# Patient Record
Sex: Female | Born: 1975 | Race: Asian | Hispanic: No | Marital: Married | Smoking: Never smoker
Health system: Southern US, Community
[De-identification: ages and names within clinical notes are randomized; demographics above are authoritative.]

## PROBLEM LIST (undated history)

## (undated) DIAGNOSIS — F419 Anxiety disorder, unspecified: Secondary | ICD-10-CM

## (undated) DIAGNOSIS — J45909 Unspecified asthma, uncomplicated: Secondary | ICD-10-CM

## (undated) DIAGNOSIS — F909 Attention-deficit hyperactivity disorder, unspecified type: Secondary | ICD-10-CM

## (undated) HISTORY — DX: Attention-deficit hyperactivity disorder, unspecified type: F90.9

## (undated) HISTORY — PX: TOTAL VAGINAL HYSTERECTOMY: SHX2548

## (undated) HISTORY — DX: Anxiety disorder, unspecified: F41.9

## (undated) HISTORY — PX: RECTAL PROLAPSE REPAIR: SHX759

## (undated) HISTORY — DX: Unspecified asthma, uncomplicated: J45.909

---

## 2020-07-27 ENCOUNTER — Ambulatory Visit: Payer: Self-pay | Admitting: Family Medicine

## 2020-08-12 ENCOUNTER — Ambulatory Visit: Payer: Self-pay | Admitting: Family Medicine

## 2020-09-07 ENCOUNTER — Ambulatory Visit: Payer: Self-pay | Admitting: Family Medicine

## 2021-06-11 ENCOUNTER — Ambulatory Visit (HOSPITAL_COMMUNITY)
Admission: EM | Admit: 2021-06-11 | Discharge: 2021-06-11 | Disposition: A | Discharge status: Home / Self Care | Payer: BC Managed Care – PPO | Attending: Physician Assistant | Admitting: Physician Assistant

## 2021-06-11 ENCOUNTER — Encounter (HOSPITAL_COMMUNITY): Payer: Self-pay

## 2021-06-11 DIAGNOSIS — H5789 Other specified disorders of eye and adnexa: Secondary | ICD-10-CM | POA: Diagnosis not present

## 2021-06-11 MED ORDER — CIPROFLOXACIN HCL 0.3 % OP SOLN: 1.0000 [drp] | OPHTHALMIC | 0 refills | Status: DC

## 2021-06-11 NOTE — Discharge Instructions (Addendum)
Use ciprofloxacin eyedrops as directed.  This will cover for bacteria in case there is the beginning of an infection in your eye.  You can use lubricating eyedrops but should avoid anything that has vasoconstrictors (redness relief).  Do not wear contacts for at least a week and until symptoms have been gone for 24 hours.  If you have any worsening symptoms including recurrent blurred vision, vision changes, ocular pain, nausea, vomiting, fever you need to be seen immediately.  If you continue to have discomfort after being on medication for several days you should follow-up with your eye doctor.

## 2021-06-11 NOTE — ED Triage Notes (Signed)
Pt states yesterday while removing her contact lenses she thinks she might have done something to her left eye. She had burning to eye, vision was not clear. Today patient states vision is better but she is still having some burning to eye.

## 2021-06-11 NOTE — ED Provider Notes (Addendum)
[place]    CSN: [id number] Arrival date & time: 06/11/21  3614      History   Chief Complaint Chief Complaint  Patient presents with   Eye Pain    HPI Megan Roach is a 45 y.o. female.   Patient presents today with 1 day history of left eye irritation and vision disturbance.  Reports that she felt a pain after removing her contact lens from her left eye and noticed that her vision was quite blurred for several hours after this.  She did try over-the-counter lubricating eyedrops which provided some relief but not long-term resolution of symptoms.  Patient continued to have significant symptoms and took Advil and went to bed.  When she woke up this morning her vision had returned to normal but she continues to have discomfort in her left thigh.  She denies any ocular pain, headache, dizziness, nausea, vomiting, fever.  She denies any foreign body sensation.  She reports discomfort is rated 3 on a 0-10 pain scale, localized to her left eye, described as burning sensation, no alleviating factors identified.   History reviewed. No pertinent past medical history.  There are no problems to display for this patient.   History reviewed. No pertinent surgical history.  OB History   No obstetric history on file.      Home Medications    Prior to Admission medications   Medication Sig Start Date End Date Taking? Authorizing Provider  ciprofloxacin (CILOXAN) 0.3 % ophthalmic solution Place 1 drop into the left eye every 2 (two) hours. Administer 1 drop, every 2 hours, while awake, for 2 days. Then 1 drop, every 4 hours, while awake, for the next 5 days. 06/11/21  Yes Rendy Lazard, Derry Skill, PA-C    Family History History reviewed. No pertinent family history.  Social History Social History   Tobacco Use   Smoking status: Never   Smokeless tobacco: Never  Vaping Use   Vaping Use: Never used  Substance Use Topics   Alcohol use: Yes    Comment: socially   Drug use:  Never     Allergies   Sulfa antibiotics   Review of Systems Review of Systems  Constitutional:  Negative for activity change, appetite change, fatigue and fever.  Eyes:  Positive for discharge, itching and visual disturbance. Negative for photophobia, pain and redness.  Respiratory:  Negative for cough and shortness of breath.   Cardiovascular:  Negative for chest pain.  Gastrointestinal:  Negative for abdominal pain, diarrhea, nausea and vomiting.  Neurological:  Negative for dizziness, light-headedness and headaches.    Physical Exam Triage Vital Signs ED Triage Vitals  Enc Vitals Group     BP 06/11/21 0833 110/72     Pulse Rate 06/11/21 0833 80     Resp 06/11/21 0833 18     Temp 06/11/21 0833 98.9 F (37.2 C)     Temp Source 06/11/21 0833 Oral     SpO2 06/11/21 0833 98 %     Weight --      Height --      Head Circumference --      Peak Flow --      Pain Score 06/11/21 0830 3     Pain Loc --      Pain Edu? --      Excl. in [place]? --    No data found.  Updated Vital Signs BP 110/72 (BP Location: Right Arm)   Pulse 80   Temp 98.9 F (37.2 C) (  Oral)   Resp 18   SpO2 98%   Visual Acuity Right Eye Distance: 20/20 Left Eye Distance: 20/50 Bilateral Distance:    Right Eye Near:   Left Eye Near:    Bilateral Near:     Physical Exam Vitals reviewed.  Constitutional:      General: She is awake. She is not in acute distress.    Appearance: Normal appearance. She is normal weight. She is not ill-appearing.     Comments: Very pleasant female appears stated age in no acute distress  HENT:     Head: Normocephalic and atraumatic.     Right Ear: External ear normal.     Left Ear: External ear normal.  Eyes:     Extraocular Movements: Extraocular movements intact.     Conjunctiva/sclera:     Right eye: Right conjunctiva is not injected.     Left eye: Left conjunctiva is injected.     Pupils: Pupils are equal, round, and reactive to light.     Funduscopic exam:     Right eye: No hemorrhage or papilledema.        Left eye: No hemorrhage or papilledema.  Cardiovascular:     Rate and Rhythm: Normal rate and regular rhythm.     Heart sounds: Normal heart sounds, S1 normal and S2 normal. No murmur heard. Pulmonary:     Effort: Pulmonary effort is normal.     Breath sounds: Normal breath sounds. No wheezing, rhonchi or rales.     Comments: Clear to auscultation bilaterally Lymphadenopathy:     Head:     Right side of head: No submental, submandibular or tonsillar adenopathy.     Left side of head: No submental, submandibular or tonsillar adenopathy.     Cervical: No cervical adenopathy.  Psychiatric:        Behavior: Behavior is cooperative.     UC Treatments / Results  Labs (all labs ordered are listed, but only abnormal results are displayed) Labs Reviewed - No data to display  EKG   Radiology No results found.  Procedures Procedures (including critical care time)  Medications Ordered in UC Medications - No data to display  Initial Impression / Assessment and Plan / UC Course  I have reviewed the triage vital signs and the nursing notes.  Pertinent labs & imaging results that were available during my care of the patient were reviewed by me and considered in my medical decision making (see chart for details).      Unfortunately, we do not have fluorescein or Woods lamp to perform fluorescein exam.  Patient denies any foreign body sensation in eye and reports discomfort is more of a burning sensation making this exam less important.  Given patient is a contact lens wearer will cover with fluoroquinolone to ensure we are treating for bacterial conjunctivitis possibly caused by Pseudomonas.  Discussed proper application of this medication with patient.  She can use lubricating eyedrops for additional symptom relief.  Discouraged her from using contact lenses for minimum of a week and all symptoms must of resolved for at least 24 hours prior  to using lenses again.  Discussed alarm symptoms that warrant emergent evaluation.  Strict return precautions given to which patient expressed understanding.  Final Clinical Impressions(s) / UC Diagnoses   Final diagnoses:  Eye irritation     Discharge Instructions      Use ciprofloxacin eyedrops as directed.  This will cover for bacteria in case there is the beginning of an  infection in your eye.  You can use lubricating eyedrops but should avoid anything that has vasoconstrictors (redness relief).  Do not wear contacts for at least a week and until symptoms have been gone for 24 hours.  If you have any worsening symptoms including recurrent blurred vision, vision changes, ocular pain, nausea, vomiting, fever you need to be seen immediately.  If you continue to have discomfort after being on medication for several days you should follow-up with your eye doctor.     ED Prescriptions     Medication Sig Dispense Auth. Provider   ciprofloxacin (CILOXAN) 0.3 % ophthalmic solution Place 1 drop into the left eye every 2 (two) hours. Administer 1 drop, every 2 hours, while awake, for 2 days. Then 1 drop, every 4 hours, while awake, for the next 5 days. 5 mL Ifeanyichukwu Wickham K, PA-C      PDMP not reviewed this encounter.   Terrilee Croak, PA-C 06/11/21 0853    Terrilee Croak, PA-C 06/11/21 [id number]

## 2021-10-15 ENCOUNTER — Ambulatory Visit: Payer: BC Managed Care – PPO | Admitting: Registered Nurse

## 2021-10-15 ENCOUNTER — Encounter: Payer: Self-pay | Admitting: Registered Nurse

## 2021-10-15 DIAGNOSIS — G5603 Carpal tunnel syndrome, bilateral upper limbs: Secondary | ICD-10-CM

## 2021-10-15 DIAGNOSIS — Z1211 Encounter for screening for malignant neoplasm of colon: Secondary | ICD-10-CM

## 2021-10-15 DIAGNOSIS — Z23 Encounter for immunization: Secondary | ICD-10-CM

## 2021-10-15 DIAGNOSIS — Z1231 Encounter for screening mammogram for malignant neoplasm of breast: Secondary | ICD-10-CM

## 2021-10-15 DIAGNOSIS — Z780 Asymptomatic menopausal state: Secondary | ICD-10-CM

## 2021-10-15 MED ORDER — VENLAFAXINE HCL ER 37.5 MG PO CP24: 37.5000 mg | ORAL_CAPSULE | Freq: Every day | ORAL | 0 refills | Status: DC

## 2021-10-15 MED ORDER — DICLOFENAC SODIUM 3 % EX GEL: 4.0000 g | Freq: Four times a day (QID) | CUTANEOUS | 5 refills | Status: DC | PRN

## 2021-10-15 NOTE — Patient Instructions (Signed)
Ms. Megan Roach to meet you!  See you in 3 mo for physical and labs.  Try diclofenac topical for pain. I have referred to hand specialist. Wear splints at night.  Start venlafaxine 37.31m daily with breakfast for menopause symptoms If no improvement we can check labs. Call if any new symptoms arise.  Thank you  Rich

## 2021-10-15 NOTE — Progress Notes (Signed)
New Patient Office Visit  Subjective:  Patient ID: Megan Roach, female    DOB: 23-Apr-1976  Age: 45 y.o. MRN: [id number]  CC: No chief complaint on file.   HPI Megan Roach presents for visit to est care.  Menopause Fatigue, weight gain, hot flashes Poor sleep despite extreme fatigue.  Itchy across neck and face.  Intermittent symptoms.   Pain in wrists Bilateral, and in R elbow. Had been sent to Neuro for nerve conduction study, no notable results found. Had been recommended to see hand specialist, not referred yet.  Does note some swelling at base of thumbs Had been on gabapentin in the past without relief.  Occ advil for pain with limited relief.   Due for colon ca screen Opts for cologuard after discussion of pros/cons  Due for flu shot, would like today No AE in past.  Pap - uptodate, will get records  Mammogram - needed - will place order.    Past Medical History:  Diagnosis Date   Asthma     Past Surgical History:  Procedure Laterality Date   TOTAL VAGINAL HYSTERECTOMY      Family History  Problem Relation Age of Onset   Bladder Cancer Father    Idiopathic pulmonary fibrosis Father    Cervical cancer Other     Social History   Socioeconomic History   Marital status: Married    Spouse name: Not on file   Number of children: Not on file   Years of education: Not on file   Highest education level: Not on file  Occupational History   Not on file  Tobacco Use   Smoking status: Never   Smokeless tobacco: Never  Vaping Use   Vaping Use: Never used  Substance and Sexual Activity   Alcohol use: Yes    Comment: socially   Drug use: Never   Sexual activity: Not on file  Other Topics Concern   Not on file  Social History Narrative   Not on file   Social Determinants of Health   Financial Resource Strain: Not on file  Food Insecurity: Not on file  Transportation Needs: Not on file  Physical Activity: Not on file  Stress: Not on  file  Social Connections: Not on file  Intimate Partner Violence: Not on file    ROS Review of Systems Per hpi   Objective:   Today's Vitals: There were no vitals taken for this visit.  Physical Exam Vitals and nursing note reviewed.  Constitutional:      General: She is not in acute distress.    Appearance: Normal appearance. She is not ill-appearing, toxic-appearing or diaphoretic.  Cardiovascular:     Rate and Rhythm: Normal rate and regular rhythm.     Pulses: Normal pulses.     Heart sounds: Normal heart sounds. No murmur heard.   No friction rub. No gallop.  Pulmonary:     Effort: Pulmonary effort is normal. No respiratory distress.     Breath sounds: Normal breath sounds. No stridor. No wheezing, rhonchi or rales.  Chest:     Chest wall: No tenderness.  Musculoskeletal:        General: Tenderness (ventral wrists, R lateral epicondyle) present. No swelling, deformity or signs of injury. Normal range of motion.     Right lower leg: No edema.     Left lower leg: No edema.  Skin:    General: Skin is warm and dry.     Capillary Refill: Capillary refill takes  less than 2 seconds.  Neurological:     General: No focal deficit present.     Mental Status: She is alert and oriented to person, place, and time. Mental status is at baseline.  Psychiatric:        Mood and Affect: Mood normal.        Behavior: Behavior normal.        Thought Content: Thought content normal.        Judgment: Judgment normal.    Assessment & Plan:   Problem List Items Addressed This Visit   None Visit Diagnoses     Bilateral carpal tunnel syndrome    -  Primary   Relevant Medications   Diclofenac Sodium 3 % GEL   venlafaxine XR (EFFEXOR XR) 37.5 MG 24 hr capsule   Other Relevant Orders   Ambulatory referral to Hand Surgery   Screening mammogram for breast cancer       Relevant Orders   MM Digital Screening   Flu vaccine need       Relevant Orders   Flu Vaccine QUAD 6+ mos PF IM  (Fluarix Quad PF)   Menopause       Relevant Medications   venlafaxine XR (EFFEXOR XR) 37.5 MG 24 hr capsule   Colon cancer screening       Relevant Orders   Cologuard       Outpatient Encounter Medications as of 10/15/2021  Medication Sig   Diclofenac Sodium 3 % GEL Apply 4 g topically 4 (four) times daily as needed.   venlafaxine XR (EFFEXOR XR) 37.5 MG 24 hr capsule Take 1 capsule (37.5 mg total) by mouth daily with breakfast.   ciprofloxacin (CILOXAN) 0.3 % ophthalmic solution Place 1 drop into the left eye every 2 (two) hours. Administer 1 drop, every 2 hours, while awake, for 2 days. Then 1 drop, every 4 hours, while awake, for the next 5 days.   No facility-administered encounter medications on file as of 10/15/2021.    Follow-up: Return in about 3 months (around 01/13/2022) for CPE / labs.   PLAN Diclofenac for wrist pain. Refer to hand doc. Offered oral steroids, pt declined. Continue to wear braces at night, rest when possible. Venlafaxine 37.51m po qd for menopausal symptoms. Med check at CPE in 3 mo Return for CPE in 3 mo Mammogram ordered Cologuard ordered Flu vaccine given Patient encouraged to call clinic with any questions, comments, or concerns.  RMaximiano Coss NP

## 2021-11-06 ENCOUNTER — Encounter: Payer: Self-pay | Admitting: Registered Nurse

## 2021-11-06 LAB — COLOGUARD: COLOGUARD: NEGATIVE

## 2021-11-24 ENCOUNTER — Encounter: Payer: Self-pay | Admitting: Registered Nurse

## 2021-11-24 ENCOUNTER — Telehealth (INDEPENDENT_AMBULATORY_CARE_PROVIDER_SITE_OTHER): Payer: BC Managed Care – PPO | Admitting: Registered Nurse

## 2021-11-24 ENCOUNTER — Telehealth: Payer: Self-pay

## 2021-11-24 ENCOUNTER — Other Ambulatory Visit: Payer: Self-pay

## 2021-11-24 VITALS — BP 139/92

## 2021-11-24 DIAGNOSIS — R1114 Bilious vomiting: Secondary | ICD-10-CM | POA: Diagnosis not present

## 2021-11-24 DIAGNOSIS — R42 Dizziness and giddiness: Secondary | ICD-10-CM | POA: Diagnosis not present

## 2021-11-24 MED ORDER — MECLIZINE HCL 25 MG PO TABS: 25.0000 mg | ORAL_TABLET | Freq: Three times a day (TID) | ORAL | 0 refills | Status: DC | PRN

## 2021-11-24 MED ORDER — ONDANSETRON 4 MG PO TBDP: 4.0000 mg | ORAL_TABLET | Freq: Three times a day (TID) | ORAL | 0 refills | Status: DC | PRN

## 2021-11-24 NOTE — Patient Instructions (Signed)
° ° ° °  If you have lab work done today you will be contacted with your lab results within the next 2 weeks.  If you have not heard from us then please contact us. The fastest way to get your results is to register for My Chart. ° ° °IF you received an x-ray today, you will receive an invoice from [facility]. Please contact [facility] at [phone] with questions or concerns regarding your invoice.  ° °IF you received labwork today, you will receive an invoice from [facility]. Please contact [facility] at [phone] with questions or concerns regarding your invoice.  ° °Our billing staff will not be able to assist you with questions regarding bills from these companies. ° °You will be contacted with the lab results as soon as they are available. The fastest way to get your results is to activate your My Chart account. Instructions are located on the last page of this paperwork. If you have not heard from us regarding the results in 2 weeks, please contact this office. °  ° ° ° °

## 2021-11-24 NOTE — Progress Notes (Signed)
Telemedicine Encounter- SOAP NOTE Established Patient  This telephone encounter was conducted with the patient's (or proxy's) verbal consent via audio telecommunications: yes/no: Yes Patient was instructed to have this encounter in a suitably private space; and to only have persons present to whom they give permission to participate. In addition, patient identity was confirmed by use of name plus two identifiers (DOB and address).  I discussed the limitations, risks, security and privacy concerns of performing an evaluation and management service by telephone and the availability of in person appointments. I also discussed with the patient that there may be a patient responsible charge related to this service. The patient expressed understanding and agreed to proceed.  I spent a total of 14 minutes talking with the patient or their proxy.  Patient at home Provider in office  Participants: Kathrin Ruddy, NP and [name]  Chief Complaint  Patient presents with   Dizziness    Patient states she has been experiencing some nausea , dizziness, and fatigue for about one month. Since yesterday symptoms are getting worse.    Subjective   Megan Roach is a 46 y.o. established patient. Telephone visit today for dizziness  HPI Notes since yesterday, has intense nausea. Feels like pregnancy but is peri/post menopausal Having a tough time with food and smells Notes excess salivation. Vomiting x 1 time - "not full fledged" Feels dizzy, even at rest.   Notes that it makes her feel down that she is so down.  She pushed herself to exercise yesterday - afterwards she was so thoroughly exhausted that she could barely move.  Her sleep has been poor for some time, but worsening lately - last two nights hardly slept with waking every hour or so.    There are no problems to display for this patient.   Past Medical History:  Diagnosis Date   Asthma     Current Outpatient  Medications  Medication Sig Dispense Refill   ciprofloxacin (CILOXAN) 0.3 % ophthalmic solution Place 1 drop into the left eye every 2 (two) hours. Administer 1 drop, every 2 hours, while awake, for 2 days. Then 1 drop, every 4 hours, while awake, for the next 5 days. 5 mL 0   Diclofenac Sodium 3 % GEL Apply 4 g topically 4 (four) times daily as needed. 100 g 5   meclizine (ANTIVERT) 25 MG tablet Take 1 tablet (25 mg total) by mouth 3 (three) times daily as needed for dizziness. 30 tablet 0   ondansetron (ZOFRAN-ODT) 4 MG disintegrating tablet Take 1 tablet (4 mg total) by mouth every 8 (eight) hours as needed for nausea or vomiting. 20 tablet 0   venlafaxine XR (EFFEXOR XR) 37.5 MG 24 hr capsule Take 1 capsule (37.5 mg total) by mouth daily with breakfast. 90 capsule 0   No current facility-administered medications for this visit.    Allergies  Allergen Reactions   Sulfa Antibiotics Swelling    Social History   Socioeconomic History   Marital status: Married    Spouse name: Not on file   Number of children: Not on file   Years of education: Not on file   Highest education level: Not on file  Occupational History   Not on file  Tobacco Use   Smoking status: Never   Smokeless tobacco: Never  Vaping Use   Vaping Use: Never used  Substance and Sexual Activity   Alcohol use: Yes    Comment: socially   Drug use: Never  Sexual activity: Not on file  Other Topics Concern   Not on file  Social History Narrative   Not on file   Social Determinants of Health   Financial Resource Strain: Not on file  Food Insecurity: Not on file  Transportation Needs: Not on file  Physical Activity: Not on file  Stress: Not on file  Social Connections: Not on file  Intimate Partner Violence: Not on file    ROS  Objective   Vitals as reported by the patient: Today's Vitals   11/24/21 1155  BP: (!) 139/92    Megan Roach was seen today for dizziness.  Diagnoses and all orders for this  visit:  Dizziness -     meclizine (ANTIVERT) 25 MG tablet; Take 1 tablet (25 mg total) by mouth 3 (three) times daily as needed for dizziness.  Bilious vomiting with nausea -     ondansetron (ZOFRAN-ODT) 4 MG disintegrating tablet; Take 1 tablet (4 mg total) by mouth every 8 (eight) hours as needed for nausea or vomiting.   PLAN Unclear etiology. Bppv vs. Viral gastric illness. Will give zofran and meclizine Return for in office visit if worsening or failing to improve Patient encouraged to call clinic with any questions, comments, or concerns.   I discussed the assessment and treatment plan with the patient. The patient was provided an opportunity to ask questions and all were answered. The patient agreed with the plan and demonstrated an understanding of the instructions.   The patient was advised to call back or seek an in-person evaluation if the symptoms worsen or if the condition fails to improve as anticipated.  I provided 14 minutes of non-face-to-face time during this encounter.  Maximiano Coss, NP

## 2021-11-25 NOTE — Telephone Encounter (Signed)
Error

## 2021-12-01 ENCOUNTER — Telehealth: Payer: Self-pay | Admitting: Registered Nurse

## 2021-12-01 NOTE — Telephone Encounter (Signed)
Pt called in stating that she still isn't feeling any better she is still having the nausea and dizziness please advise on if she should come in to be seen again

## 2021-12-02 NOTE — Telephone Encounter (Signed)
Should be seen in person if feeling has persisted.  Thanks,  Denice Paradise

## 2021-12-03 ENCOUNTER — Other Ambulatory Visit: Payer: Self-pay

## 2021-12-03 ENCOUNTER — Ambulatory Visit: Payer: BC Managed Care – PPO | Admitting: Registered Nurse

## 2021-12-03 ENCOUNTER — Encounter: Payer: Self-pay | Admitting: Registered Nurse

## 2021-12-03 VITALS — BP 112/68 | HR 77 | Temp 98.0°F | Resp 18 | Ht 59.0 in | Wt 152.4 lb

## 2021-12-03 DIAGNOSIS — J339 Nasal polyp, unspecified: Secondary | ICD-10-CM

## 2021-12-03 DIAGNOSIS — Z8639 Personal history of other endocrine, nutritional and metabolic disease: Secondary | ICD-10-CM | POA: Diagnosis not present

## 2021-12-03 DIAGNOSIS — R110 Nausea: Secondary | ICD-10-CM

## 2021-12-03 DIAGNOSIS — R5383 Other fatigue: Secondary | ICD-10-CM

## 2021-12-03 LAB — URINALYSIS, ROUTINE W REFLEX MICROSCOPIC
Bilirubin Urine: NEGATIVE
Ketones, ur: NEGATIVE
Leukocytes,Ua: NEGATIVE
Nitrite: NEGATIVE
Specific Gravity, Urine: 1.02 (ref 1.000–1.030)
Total Protein, Urine: NEGATIVE
Urine Glucose: NEGATIVE
Urobilinogen, UA: 0.2 (ref 0.0–1.0)
pH: 5.5 (ref 5.0–8.0)

## 2021-12-03 LAB — CBC WITH DIFFERENTIAL/PLATELET
Basophils Absolute: 0 10*3/uL (ref 0.0–0.1)
Basophils Relative: 0.4 % (ref 0.0–3.0)
Eosinophils Absolute: 0.1 10*3/uL (ref 0.0–0.7)
Eosinophils Relative: 1.5 % (ref 0.0–5.0)
HCT: 38.3 % (ref 36.0–46.0)
Hemoglobin: 13.1 g/dL (ref 12.0–15.0)
Lymphocytes Relative: 30.1 % (ref 12.0–46.0)
Lymphs Abs: 1.7 10*3/uL (ref 0.7–4.0)
MCHC: 34.1 g/dL (ref 30.0–36.0)
MCV: 86.6 fl (ref 78.0–100.0)
Monocytes Absolute: 0.4 10*3/uL (ref 0.1–1.0)
Monocytes Relative: 6.7 % (ref 3.0–12.0)
Neutro Abs: 3.5 10*3/uL (ref 1.4–7.7)
Neutrophils Relative %: 61.3 % (ref 43.0–77.0)
Platelets: 283 10*3/uL (ref 150.0–400.0)
RBC: 4.43 Mil/uL (ref 3.87–5.11)
RDW: 14.2 % (ref 11.5–15.5)
WBC: 5.7 10*3/uL (ref 4.0–10.5)

## 2021-12-03 LAB — COMPREHENSIVE METABOLIC PANEL
ALT: 30 U/L (ref 0–35)
AST: 20 U/L (ref 0–37)
Albumin: 4.3 g/dL (ref 3.5–5.2)
Alkaline Phosphatase: 51 U/L (ref 39–117)
BUN: 18 mg/dL (ref 6–23)
CO2: 29 mEq/L (ref 19–32)
Calcium: 9.2 mg/dL (ref 8.4–10.5)
Chloride: 102 mEq/L (ref 96–112)
Creatinine, Ser: 0.81 mg/dL (ref 0.40–1.20)
GFR: 86.4 mL/min (ref >60.00)
Glucose, Bld: 83 mg/dL (ref 70–99)
Potassium: 3.9 mEq/L (ref 3.5–5.1)
Sodium: 136 mEq/L (ref 135–145)
Total Bilirubin: 0.6 mg/dL (ref 0.2–1.2)
Total Protein: 6.8 g/dL (ref 6.0–8.3)

## 2021-12-03 LAB — B12 AND FOLATE PANEL
Folate: 15 ng/mL (ref >5.9)
Vitamin B-12: 435 pg/mL (ref 211–911)

## 2021-12-03 LAB — LIPID PANEL
Cholesterol: 203 mg/dL — ABNORMAL HIGH (ref 0–200)
HDL: 67.8 mg/dL (ref >39.00)
LDL Cholesterol: 119 mg/dL — ABNORMAL HIGH (ref 0–99)
NonHDL: 134.89
Total CHOL/HDL Ratio: 3
Triglycerides: 80 mg/dL (ref 0.0–149.0)
VLDL: 16 mg/dL (ref 0.0–40.0)

## 2021-12-03 LAB — TSH: TSH: 1.98 u[IU]/mL (ref 0.35–5.50)

## 2021-12-03 LAB — HEMOGLOBIN A1C: Hgb A1c MFr Bld: 5.4 % (ref 4.6–6.5)

## 2021-12-03 LAB — VITAMIN D 25 HYDROXY (VIT D DEFICIENCY, FRACTURES): VITD: 23.49 ng/mL — ABNORMAL LOW (ref 30.00–100.00)

## 2021-12-03 MED ORDER — PANTOPRAZOLE SODIUM 40 MG PO TBEC: 40.0000 mg | DELAYED_RELEASE_TABLET | Freq: Every day | ORAL | 3 refills | Status: DC

## 2021-12-03 MED ORDER — FLUTICASONE PROPIONATE 50 MCG/ACT NA SUSP: 2.0000 | Freq: Every day | NASAL | 6 refills | Status: DC

## 2021-12-03 NOTE — Patient Instructions (Signed)
Ms. Aleighya Mcanelly to see you, sorry you're not feeling well.  Call if things get worse  I'll let you know how labs are looking  Thank you  Rich

## 2021-12-03 NOTE — Telephone Encounter (Signed)
Pt is scheduled for today

## 2021-12-03 NOTE — Progress Notes (Signed)
Established Patient Office Visit  Subjective:  Patient ID: Megan Roach, female    DOB: 12-16-1975  Age: 46 y.o. MRN: [id number]  CC:  Chief Complaint  Patient presents with   Follow-up    Patient states she is follow up for 11/24/2021 for dizziness and nausea. Patient states when she take the medication she is feeling better but when she stop she is back feeling bad again     HPI Megan Roach presents for follow up  Seen virtually on 11/24/21 for nvd, fatigue  Onset on 11/23/21. Trouble with smells, foods. Feels like pregnancy, but she is perimenopausal with irregular menses, denied concern for pregnancy.  Dizziness at rest. One episode vomiting.  Excess salivation like she has been preparing to vomit  We had given her meclizien and zofran, when taking both, she does feel better with both of these, but symptoms still occur Symptoms occur episodically - every few hours - no clear pattern Has not been vomiting. No diarrhea.  Notes her heart races in response to these episodes   S/p hysterectomy   Has continued to be mindful of healthy diet and exercise.   Past Medical History:  Diagnosis Date   Asthma     Past Surgical History:  Procedure Laterality Date   TOTAL VAGINAL HYSTERECTOMY      Family History  Problem Relation Age of Onset   Bladder Cancer Father    Idiopathic pulmonary fibrosis Father    Cervical cancer Other     Social History   Socioeconomic History   Marital status: Married    Spouse name: Not on file   Number of children: Not on file   Years of education: Not on file   Highest education level: Not on file  Occupational History   Not on file  Tobacco Use   Smoking status: Never   Smokeless tobacco: Never  Vaping Use   Vaping Use: Never used  Substance and Sexual Activity   Alcohol use: Yes    Comment: socially   Drug use: Never   Sexual activity: Not on file  Other Topics Concern   Not on file  Social History Narrative   Not  on file   Social Determinants of Health   Financial Resource Strain: Not on file  Food Insecurity: Not on file  Transportation Needs: Not on file  Physical Activity: Not on file  Stress: Not on file  Social Connections: Not on file  Intimate Partner Violence: Not on file    Outpatient Medications Prior to Visit  Medication Sig Dispense Refill   ciprofloxacin (CILOXAN) 0.3 % ophthalmic solution Place 1 drop into the left eye every 2 (two) hours. Administer 1 drop, every 2 hours, while awake, for 2 days. Then 1 drop, every 4 hours, while awake, for the next 5 days. 5 mL 0   Diclofenac Sodium 3 % GEL Apply 4 g topically 4 (four) times daily as needed. 100 g 5   meclizine (ANTIVERT) 25 MG tablet Take 1 tablet (25 mg total) by mouth 3 (three) times daily as needed for dizziness. 30 tablet 0   ondansetron (ZOFRAN-ODT) 4 MG disintegrating tablet Take 1 tablet (4 mg total) by mouth every 8 (eight) hours as needed for nausea or vomiting. 20 tablet 0   venlafaxine XR (EFFEXOR XR) 37.5 MG 24 hr capsule Take 1 capsule (37.5 mg total) by mouth daily with breakfast. 90 capsule 0   No facility-administered medications prior to visit.    Allergies  Allergen  Reactions   Sulfa Antibiotics Swelling    ROS Review of Systems  Constitutional: Negative.   HENT: Negative.    Eyes: Negative.   Respiratory: Negative.    Cardiovascular: Negative.   Gastrointestinal: Negative.   Genitourinary: Negative.   Musculoskeletal: Negative.   Skin: Negative.   Neurological: Negative.   Psychiatric/Behavioral: Negative.    All other systems reviewed and are negative.    Objective:    Physical Exam Vitals and nursing note reviewed.  Constitutional:      General: She is not in acute distress.    Appearance: Normal appearance. She is normal weight. She is not ill-appearing, toxic-appearing or diaphoretic.  Cardiovascular:     Rate and Rhythm: Normal rate and regular rhythm.     Heart sounds: Normal  heart sounds. No murmur heard.   No friction rub. No gallop.  Pulmonary:     Effort: Pulmonary effort is normal. No respiratory distress.     Breath sounds: Normal breath sounds. No stridor. No wheezing, rhonchi or rales.  Chest:     Chest wall: No tenderness.  Skin:    General: Skin is warm and dry.  Neurological:     General: No focal deficit present.     Mental Status: She is alert and oriented to person, place, and time. Mental status is at baseline.  Psychiatric:        Mood and Affect: Mood normal.        Behavior: Behavior normal.        Thought Content: Thought content normal.        Judgment: Judgment normal.    BP 112/68    Pulse 77    Temp 98 F (36.7 C) (Temporal)    Resp 18    Ht 4' 11"  (1.499 m)    Wt 152 lb 6.4 oz (69.1 kg)    SpO2 97%    BMI 30.78 kg/m  Wt Readings from Last 3 Encounters:  12/03/21 152 lb 6.4 oz (69.1 kg)     Health Maintenance Due  Topic Date Due   HIV Screening  Never done   Hepatitis C Screening  Never done   TETANUS/TDAP  Never done   PAP SMEAR-Modifier  Never done   COLONOSCOPY (Pts 45-37yr Insurance coverage will need to be confirmed)  Never done   COVID-19 Vaccine (4 - Booster for Moderna series) 11/21/2020    There are no preventive care reminders to display for this patient.  No results found for: TSH No results found for: WBC, HGB, HCT, MCV, PLT No results found for: NA, K, CHLORIDE, CO2, GLUCOSE, BUN, CREATININE, BILITOT, ALKPHOS, AST, ALT, PROT, ALBUMIN, CALCIUM, ANIONGAP, EGFR, GFR No results found for: CHOL No results found for: HDL No results found for: LDLCALC No results found for: TRIG No results found for: CHOLHDL No results found for: HGBA1C    Assessment & Plan:   Problem List Items Addressed This Visit   None Visit Diagnoses     Other fatigue    -  Primary   Relevant Orders   CBC with Differential/Platelet   Comprehensive metabolic panel   Lipid panel   TSH   Hemoglobin A1c   B12 and Folate Panel    Vitamin D (25 hydroxy)   Urinalysis, Routine w reflex microscopic   Nausea without vomiting       Relevant Medications   pantoprazole (PROTONIX) 40 MG tablet   Other Relevant Orders   CBC with Differential/Platelet   Comprehensive metabolic panel  Lipid panel   TSH   Hemoglobin A1c   B12 and Folate Panel   Vitamin D (25 hydroxy)   Urinalysis, Routine w reflex microscopic   History of thyroid disease       Relevant Orders   CBC with Differential/Platelet   Comprehensive metabolic panel   Lipid panel   TSH   Hemoglobin A1c   B12 and Folate Panel   Vitamin D (25 hydroxy)   Urinalysis, Routine w reflex microscopic   Nasal polyp       Relevant Medications   fluticasone (FLONASE) 50 MCG/ACT nasal spray       Meds ordered this encounter  Medications   pantoprazole (PROTONIX) 40 MG tablet    Sig: Take 1 tablet (40 mg total) by mouth daily.    Dispense:  90 tablet    Refill:  3    Order Specific Question:   Supervising Provider    Answer:   Carlota Raspberry, JEFFREY R [[id number]]   fluticasone (FLONASE) 50 MCG/ACT nasal spray    Sig: Place 2 sprays into both nostrils daily.    Dispense:  16 g    Refill:  6    Order Specific Question:   Supervising Provider    Answer:   Carlota Raspberry, JEFFREY R [[id number]]    Follow-up: Return if symptoms worsen or fail to improve.   PLAN Unclear etiology. Possible gerd, will give pantoprazole. Hx of hypothyroidism, may contribute to slow gastric emptying? Will check labs. Flonase for nasal polyp Patient encouraged to call clinic with any questions, comments, or concerns.  Maximiano Coss, NP

## 2021-12-24 ENCOUNTER — Encounter: Payer: Self-pay | Admitting: Registered Nurse

## 2022-01-06 ENCOUNTER — Ambulatory Visit (INDEPENDENT_AMBULATORY_CARE_PROVIDER_SITE_OTHER): Payer: BC Managed Care – PPO | Admitting: Registered Nurse

## 2022-01-06 ENCOUNTER — Encounter: Payer: Self-pay | Admitting: Registered Nurse

## 2022-01-06 ENCOUNTER — Other Ambulatory Visit: Payer: Self-pay

## 2022-01-06 VITALS — BP 126/84 | HR 78 | Temp 98.1°F | Resp 16 | Ht <= 58 in | Wt 153.4 lb

## 2022-01-06 DIAGNOSIS — Z1231 Encounter for screening mammogram for malignant neoplasm of breast: Secondary | ICD-10-CM

## 2022-01-06 DIAGNOSIS — Z780 Asymptomatic menopausal state: Secondary | ICD-10-CM

## 2022-01-06 MED ORDER — VENLAFAXINE HCL ER 75 MG PO CP24: 75.0000 mg | ORAL_CAPSULE | Freq: Every day | ORAL | 1 refills | Status: DC

## 2022-01-06 NOTE — Progress Notes (Signed)
? ?Complete physical exam ? ?Patient: Megan Roach   DOB: 12-07-1975   46 y.o. Female  MRN: [id number] ?Visit Date: 01/06/2022 ? ?Subjective:  ? No chief complaint on file. ? ? ?Megan Roach is a 46 y.o. female who presents today for a complete physical exam. She reports consuming a general diet. The patient does not participate in regular exercise at present. She generally feels well. She reports sleeping well. She does have additional problems to discuss today.  ? ?Menopause ?Having a tough time ?Fatigue, low energy, poor sleep.  ?On venlafaxine - mild effect ?Interested in adjusting dosing. ? ? ?Vision:Within the last year ?Dental:Within Last 6 months ?STD Screen:No ? ?Most recent fall risk assessment: ? ?  01/06/2022  ?  8:15 AM  ?Fall Risk   ?Falls in the past year? 0  ?Risk for fall due to : No Fall Risks  ?Follow up Falls evaluation completed  ? ?  ?Most recent depression screenings: ? ?  01/06/2022  ?  8:14 AM 11/24/2021  ? 11:57 AM  ?PHQ 2/9 Scores  ?PHQ - 2 Score 2 0  ?PHQ- 9 Score 12 0  ? ? ? ?Past Medical History:  ?Diagnosis Date  ? Asthma   ? ?Allergies  ?Allergen Reactions  ? Sulfa Antibiotics Swelling  ? ?  ?Patient Care Team: ?Maximiano Coss, NP as PCP - General (Adult Health Nurse Practitioner)  ? ?Medications: ?Outpatient Medications Prior to Visit  ?Medication Sig  ? fluticasone (FLONASE) 50 MCG/ACT nasal spray Place 2 sprays into both nostrils daily.  ? meclizine (ANTIVERT) 25 MG tablet Take 1 tablet (25 mg total) by mouth 3 (three) times daily as needed for dizziness.  ? ondansetron (ZOFRAN-ODT) 4 MG disintegrating tablet Take 1 tablet (4 mg total) by mouth every 8 (eight) hours as needed for nausea or vomiting.  ? pantoprazole (PROTONIX) 40 MG tablet Take 1 tablet (40 mg total) by mouth daily.  ? [DISCONTINUED] venlafaxine XR (EFFEXOR XR) 37.5 MG 24 hr capsule Take 1 capsule (37.5 mg total) by mouth daily with breakfast.  ? [DISCONTINUED] ciprofloxacin (CILOXAN) 0.3 % ophthalmic solution  Place 1 drop into the left eye every 2 (two) hours. Administer 1 drop, every 2 hours, while awake, for 2 days. Then 1 drop, every 4 hours, while awake, for the next 5 days. (Patient not taking: Reported on 01/06/2022)  ? [DISCONTINUED] Diclofenac Sodium 3 % GEL Apply 4 g topically 4 (four) times daily as needed. (Patient not taking: Reported on 01/06/2022)  ? ?No facility-administered medications prior to visit.  ? ? ?Review of Systems  ?Constitutional: Negative.   ?HENT: Negative.    ?Eyes: Negative.   ?Respiratory: Negative.    ?Cardiovascular: Negative.   ?Gastrointestinal: Negative.   ?Genitourinary: Negative.   ?Musculoskeletal: Negative.   ?Skin: Negative.   ?Neurological: Negative.   ?Psychiatric/Behavioral: Negative.    ?All other systems reviewed and are negative. ? ? ?   ?Objective:  ? ?  ?BP 126/84   Pulse 78   Temp 98.1 ?F (36.7 ?C)   Resp 16   Ht 4' 10"  (1.473 m)   Wt 153 lb 6.4 oz (69.6 kg)   SpO2 98%   BMI 32.06 kg/m?  ? ? ? ?Physical Exam ?Vitals and nursing note reviewed.  ?Constitutional:   ?   General: She is not in acute distress. ?   Appearance: Normal appearance. She is normal weight. She is not ill-appearing, toxic-appearing or diaphoretic.  ?HENT:  ?   Head: Normocephalic and  atraumatic.  ?   Right Ear: Tympanic membrane, ear canal and external ear normal. There is no impacted cerumen.  ?   Left Ear: Tympanic membrane, ear canal and external ear normal. There is no impacted cerumen.  ?   Nose: Nose normal. No congestion or rhinorrhea.  ?   Mouth/Throat:  ?   Mouth: Mucous membranes are moist.  ?   Pharynx: Oropharynx is clear. No oropharyngeal exudate or posterior oropharyngeal erythema.  ?Eyes:  ?   General: No scleral icterus.    ?   Right eye: No discharge.     ?   Left eye: No discharge.  ?   Extraocular Movements: Extraocular movements intact.  ?   Conjunctiva/sclera: Conjunctivae normal.  ?   Pupils: Pupils are equal, round, and reactive to light.  ?Neck:  ?   Vascular: No carotid  bruit.  ?Cardiovascular:  ?   Rate and Rhythm: Normal rate and regular rhythm.  ?   Pulses: Normal pulses.  ?   Heart sounds: Normal heart sounds. No murmur heard. ?  No friction rub. No gallop.  ?Pulmonary:  ?   Effort: Pulmonary effort is normal. No respiratory distress.  ?   Breath sounds: Normal breath sounds. No stridor. No wheezing, rhonchi or rales.  ?Chest:  ?   Chest wall: No tenderness.  ?Abdominal:  ?   General: Abdomen is flat. Bowel sounds are normal. There is no distension.  ?   Palpations: There is no mass.  ?   Tenderness: There is no abdominal tenderness. There is no right CVA tenderness, left CVA tenderness, guarding or rebound.  ?   Hernia: No hernia is present.  ?Musculoskeletal:     ?   General: No swelling, tenderness, deformity or signs of injury. Normal range of motion.  ?   Cervical back: Normal range of motion and neck supple. No rigidity or tenderness.  ?   Right lower leg: No edema.  ?   Left lower leg: No edema.  ?Lymphadenopathy:  ?   Cervical: No cervical adenopathy.  ?Skin: ?   General: Skin is warm and dry.  ?   Capillary Refill: Capillary refill takes less than 2 seconds.  ?   Coloration: Skin is not jaundiced or pale.  ?   Findings: No bruising, erythema, lesion or rash.  ?Neurological:  ?   General: No focal deficit present.  ?   Mental Status: She is alert and oriented to person, place, and time. Mental status is at baseline.  ?   Cranial Nerves: No cranial nerve deficit.  ?   Sensory: No sensory deficit.  ?   Motor: No weakness.  ?   Coordination: Coordination normal.  ?   Gait: Gait normal.  ?   Deep Tendon Reflexes: Reflexes normal.  ?Psychiatric:     ?   Mood and Affect: Mood normal.     ?   Behavior: Behavior normal.     ?   Thought Content: Thought content normal.     ?   Judgment: Judgment normal.  ?  ? ?No results found for any visits on 01/06/22. ?   ?Assessment & Plan:  ?  ?Routine Health Maintenance and Physical Exam ? ?Immunization History  ?Administered Date(s)  Administered  ? Influenza,inj,Quad PF,6+ Mos 10/15/2021  ? Moderna Sars-Covid-2 Vaccination 01/10/2020, 02/07/2020  ? PFIZER(Purple Top)SARS-COV-2 Vaccination 09/26/2020  ? ? ?Health Maintenance  ?Topic Date Due  ? TETANUS/TDAP  Never done  ? PAP SMEAR-Modifier  Never done  ? COVID-19 Vaccine (4 - Booster for Moderna series) 11/21/2020  ? Hepatitis C Screening  01/07/2023 (Originally 06/03/1992)  ? HIV Screening  01/07/2023 (Originally 06/03/1989)  ? COLONOSCOPY (Pts 45-48yr Insurance coverage will need to be confirmed)  11/02/2031  ? INFLUENZA VACCINE  Completed  ? HPV VACCINES  Aged Out  ? ? ?Discussed health benefits of physical activity, and encouraged her to engage in regular exercise appropriate for her age and condition. ? ?Problem List Items Addressed This Visit   ?None ?Visit Diagnoses   ? ? Screening mammogram for breast cancer    -  Primary  ? Relevant Orders  ? MM Digital Screening  ? Menopause      ? Relevant Medications  ? venlafaxine XR (EFFEXOR-XR) 75 MG 24 hr capsule  ? Other Relevant Orders  ? Ambulatory referral to Gynecology  ? ?  ? ?Return in about 1 year (around 01/07/2023) for CPE and labs. ? ?  ?PLAN ?Increase venlafaxine to 752mpo qd ?Refer to gyn ?Mm ordered ?Return annually ?Exam unremarkable ?Reviewed last labs ?Patient encouraged to call clinic with any questions, comments, or concerns. ? ?RiMaximiano CossNP ?

## 2022-01-06 NOTE — Patient Instructions (Signed)
Ms. Lichty -  ? ?Always a pleasure to see you ?Let me know how things go in coming weeks ? ?Referrals placed, med adjusted ? ?Thank you ? ?Rich  ?

## 2022-01-29 ENCOUNTER — Observation Stay (HOSPITAL_COMMUNITY)
Admission: EM | Admit: 2022-01-29 | Discharge: 2022-01-31 | Disposition: A | Discharge status: Home / Self Care | Payer: BC Managed Care – PPO | Attending: Internal Medicine | Admitting: Internal Medicine

## 2022-01-29 ENCOUNTER — Emergency Department (HOSPITAL_COMMUNITY): Payer: BC Managed Care – PPO

## 2022-01-29 ENCOUNTER — Observation Stay (HOSPITAL_COMMUNITY): Payer: BC Managed Care – PPO

## 2022-01-29 DIAGNOSIS — R531 Weakness: Secondary | ICD-10-CM | POA: Diagnosis not present

## 2022-01-29 DIAGNOSIS — F4320 Adjustment disorder, unspecified: Principal | ICD-10-CM | POA: Insufficient documentation

## 2022-01-29 DIAGNOSIS — J45909 Unspecified asthma, uncomplicated: Secondary | ICD-10-CM | POA: Insufficient documentation

## 2022-01-29 DIAGNOSIS — R3129 Other microscopic hematuria: Secondary | ICD-10-CM | POA: Insufficient documentation

## 2022-01-29 DIAGNOSIS — R4182 Altered mental status, unspecified: Secondary | ICD-10-CM | POA: Diagnosis present

## 2022-01-29 DIAGNOSIS — R7309 Other abnormal glucose: Secondary | ICD-10-CM | POA: Insufficient documentation

## 2022-01-29 DIAGNOSIS — R2990 Unspecified symptoms and signs involving the nervous system: Secondary | ICD-10-CM | POA: Diagnosis present

## 2022-01-29 DIAGNOSIS — F4321 Adjustment disorder with depressed mood: Secondary | ICD-10-CM | POA: Diagnosis present

## 2022-01-29 DIAGNOSIS — Z20822 Contact with and (suspected) exposure to covid-19: Secondary | ICD-10-CM | POA: Diagnosis not present

## 2022-01-29 DIAGNOSIS — G4453 Primary thunderclap headache: Secondary | ICD-10-CM | POA: Diagnosis present

## 2022-01-29 DIAGNOSIS — N951 Menopausal and female climacteric states: Secondary | ICD-10-CM | POA: Insufficient documentation

## 2022-01-29 DIAGNOSIS — Z634 Disappearance and death of family member: Secondary | ICD-10-CM | POA: Diagnosis present

## 2022-01-29 DIAGNOSIS — G43509 Persistent migraine aura without cerebral infarction, not intractable, without status migrainosus: Secondary | ICD-10-CM

## 2022-01-29 LAB — ETHANOL: Alcohol, Ethyl (B): <10 mg/dL (ref <10)

## 2022-01-29 LAB — RESP PANEL BY RT-PCR (FLU A&B, COVID) ARPGX2
Influenza A by PCR: NEGATIVE
Influenza B by PCR: NEGATIVE
SARS Coronavirus 2 by RT PCR: NEGATIVE

## 2022-01-29 LAB — CBC
HCT: 39.6 % (ref 36.0–46.0)
Hemoglobin: 13.3 g/dL (ref 12.0–15.0)
MCH: 30 pg (ref 26.0–34.0)
MCHC: 33.6 g/dL (ref 30.0–36.0)
MCV: 89.2 fL (ref 80.0–100.0)
Platelets: 307 10*3/uL (ref 150–400)
RBC: 4.44 MIL/uL (ref 3.87–5.11)
RDW: 13.4 % (ref 11.5–15.5)
WBC: 6.2 10*3/uL (ref 4.0–10.5)
nRBC: 0 % (ref 0.0–0.2)

## 2022-01-29 LAB — I-STAT CHEM 8, ED
BUN: 14 mg/dL (ref 6–20)
Calcium, Ion: 1.21 mmol/L (ref 1.15–1.40)
Chloride: 104 mmol/L (ref 98–111)
Creatinine, Ser: 0.8 mg/dL (ref 0.44–1.00)
Glucose, Bld: 104 mg/dL — ABNORMAL HIGH (ref 70–99)
HCT: 38 % (ref 36.0–46.0)
Hemoglobin: 12.9 g/dL (ref 12.0–15.0)
Potassium: 4.2 mmol/L (ref 3.5–5.1)
Sodium: 141 mmol/L (ref 135–145)
TCO2: 26 mmol/L (ref 22–32)

## 2022-01-29 LAB — URINALYSIS, ROUTINE W REFLEX MICROSCOPIC
Bilirubin Urine: NEGATIVE
Glucose, UA: NEGATIVE mg/dL
Ketones, ur: NEGATIVE mg/dL
Leukocytes,Ua: NEGATIVE
Nitrite: NEGATIVE
Protein, ur: NEGATIVE mg/dL
Specific Gravity, Urine: 1.01 (ref 1.005–1.030)
pH: 6 (ref 5.0–8.0)

## 2022-01-29 LAB — COMPREHENSIVE METABOLIC PANEL
ALT: 19 U/L (ref 0–44)
AST: 20 U/L (ref 15–41)
Albumin: 3.6 g/dL (ref 3.5–5.0)
Alkaline Phosphatase: 51 U/L (ref 38–126)
Anion gap: 6 (ref 5–15)
BUN: 13 mg/dL (ref 6–20)
CO2: 25 mmol/L (ref 22–32)
Calcium: 9.1 mg/dL (ref 8.9–10.3)
Chloride: 108 mmol/L (ref 98–111)
Creatinine, Ser: 0.85 mg/dL (ref 0.44–1.00)
GFR, Estimated: >60 mL/min (ref >60)
Glucose, Bld: 107 mg/dL — ABNORMAL HIGH (ref 70–99)
Potassium: 4.1 mmol/L (ref 3.5–5.1)
Sodium: 139 mmol/L (ref 135–145)
Total Bilirubin: 0.6 mg/dL (ref 0.3–1.2)
Total Protein: 6.8 g/dL (ref 6.5–8.1)

## 2022-01-29 LAB — DIFFERENTIAL
Abs Immature Granulocytes: 0.03 10*3/uL (ref 0.00–0.07)
Basophils Absolute: 0 10*3/uL (ref 0.0–0.1)
Basophils Relative: 1 %
Eosinophils Absolute: 0.1 10*3/uL (ref 0.0–0.5)
Eosinophils Relative: 2 %
Immature Granulocytes: 1 %
Lymphocytes Relative: 27 %
Lymphs Abs: 1.7 10*3/uL (ref 0.7–4.0)
Monocytes Absolute: 0.4 10*3/uL (ref 0.1–1.0)
Monocytes Relative: 7 %
Neutro Abs: 3.9 10*3/uL (ref 1.7–7.7)
Neutrophils Relative %: 62 %

## 2022-01-29 LAB — CBG MONITORING, ED: Glucose-Capillary: 115 mg/dL — ABNORMAL HIGH (ref 70–99)

## 2022-01-29 LAB — RAPID URINE DRUG SCREEN, HOSP PERFORMED
Amphetamines: NOT DETECTED
Barbiturates: NOT DETECTED
Benzodiazepines: NOT DETECTED
Cocaine: NOT DETECTED
Opiates: NOT DETECTED
Tetrahydrocannabinol: NOT DETECTED

## 2022-01-29 LAB — APTT: aPTT: 40 seconds — ABNORMAL HIGH (ref 24–36)

## 2022-01-29 LAB — FOLATE: Folate: 23.3 ng/mL (ref >5.9)

## 2022-01-29 LAB — PROTIME-INR
INR: 1.1 (ref 0.8–1.2)
Prothrombin Time: 14.1 seconds (ref 11.4–15.2)

## 2022-01-29 LAB — I-STAT BETA HCG BLOOD, ED (MC, WL, AP ONLY): I-stat hCG, quantitative: <5 m[IU]/mL (ref <5)

## 2022-01-29 LAB — MAGNESIUM: Magnesium: 2 mg/dL (ref 1.7–2.4)

## 2022-01-29 LAB — VITAMIN B12: Vitamin B-12: 461 pg/mL (ref 180–914)

## 2022-01-29 IMAGING — MR MR THORACIC SPINE W/O CM
5 of 6 series · 23 of 48 positions shown · non-contrast
Comparison: Comparison made with concomitant MRI of the brain
performed at the same time.

CLINICAL DATA: Initial evaluation for acute neuro deficit, evaluate
for multiple sclerosis.

EXAM:
MRI CERVICAL AND THORACIC SPINE WITHOUT CONTRAST
TECHNIQUE: Multiplanar and multiecho pulse sequences of the cervical spine, to
include the craniocervical junction and cervicothoracic junction,
and the thoracic spine, were obtained without intravenous contrast.

[Series 23: T1 · sagittal · 6.0mm · 1.25mm/px · 2 of 8 slices shown (1 of 2)]
[im 1/8]
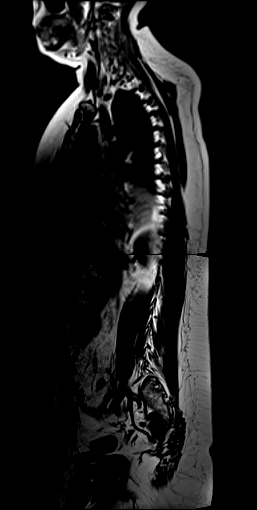
[im 8/8]
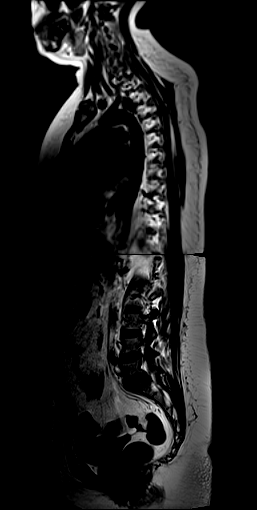

[Series 24: T2 · sagittal · 3.0mm · 0.76mm/px · 6 of 17 slices shown (1 of 2)]
[im 1/17]
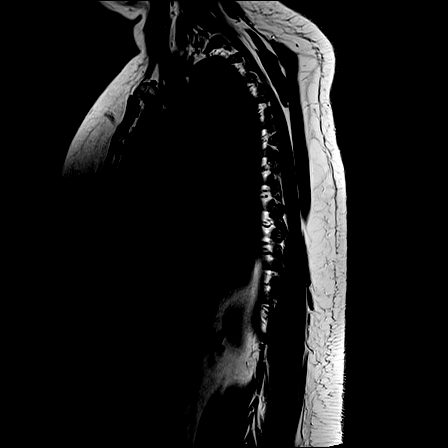
[im 4/17]
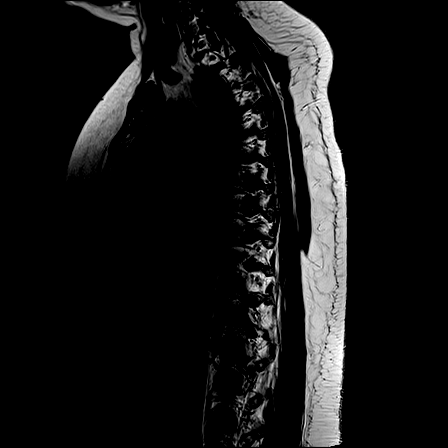
[im 7/17]
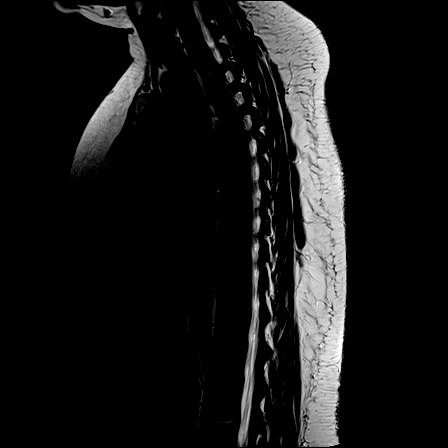
[im 10/17]
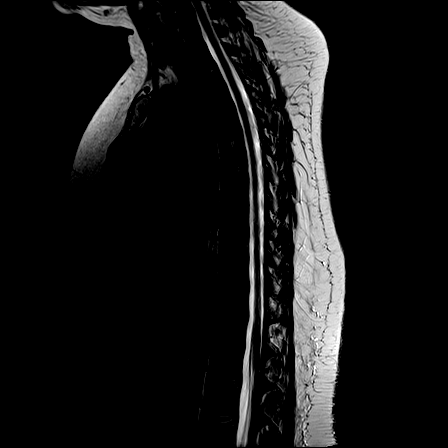
[im 13/17]
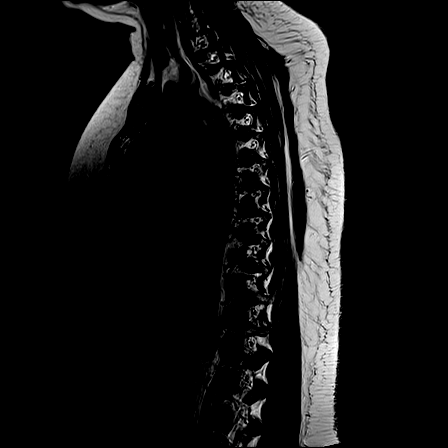
[im 17/17]
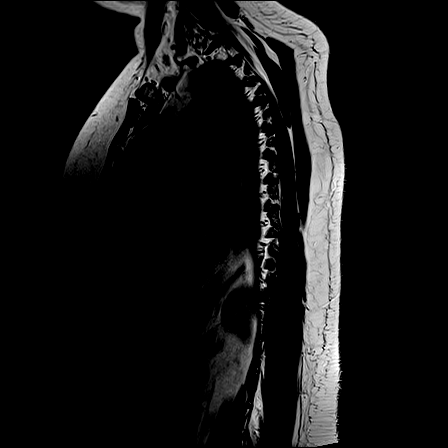

[Series 25: T1 · sagittal · 3.0mm · 0.76mm/px · 6 of 17 slices shown (2 of 2)]
[im 1/17]
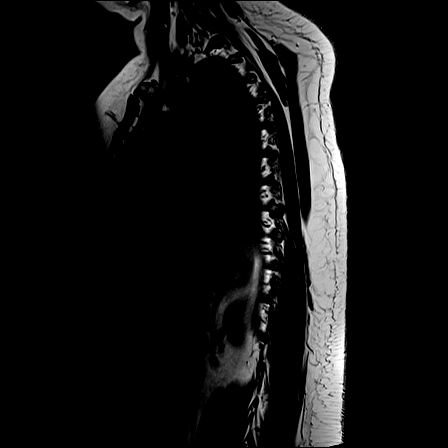
[im 4/17]
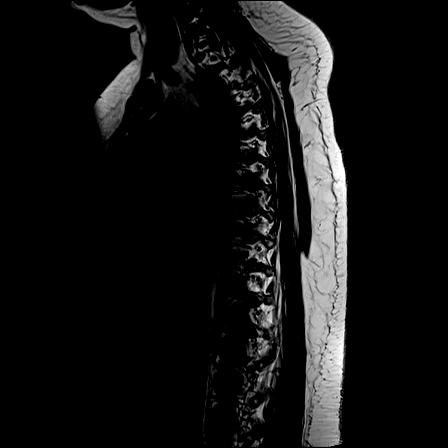
[im 7/17]
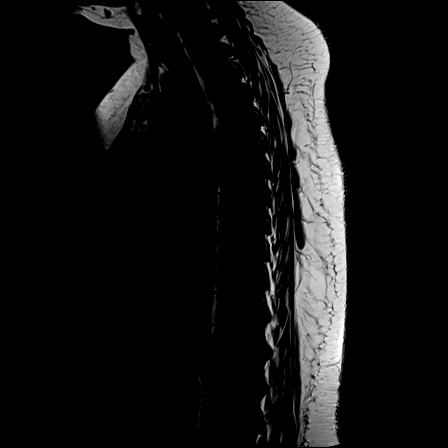
[im 10/17]
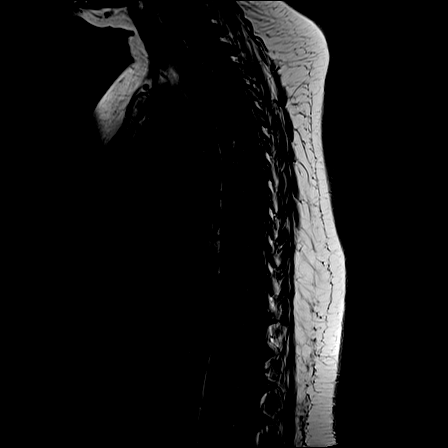
[im 13/17]
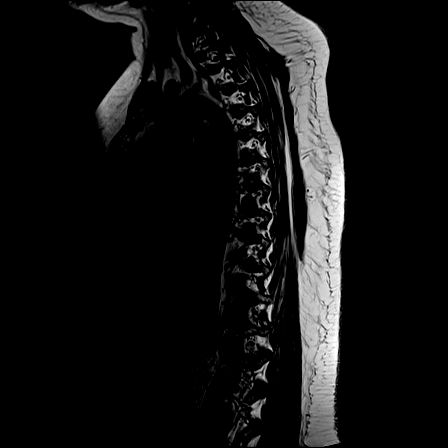
[im 17/17]
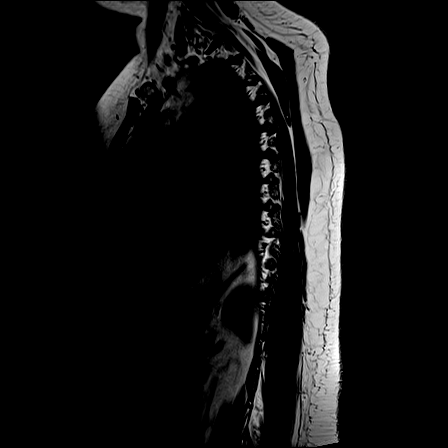

[Series 26: STIR · sagittal · 3.0mm · 0.38mm/px · 1 of 17 slices shown]
[im 1/17]
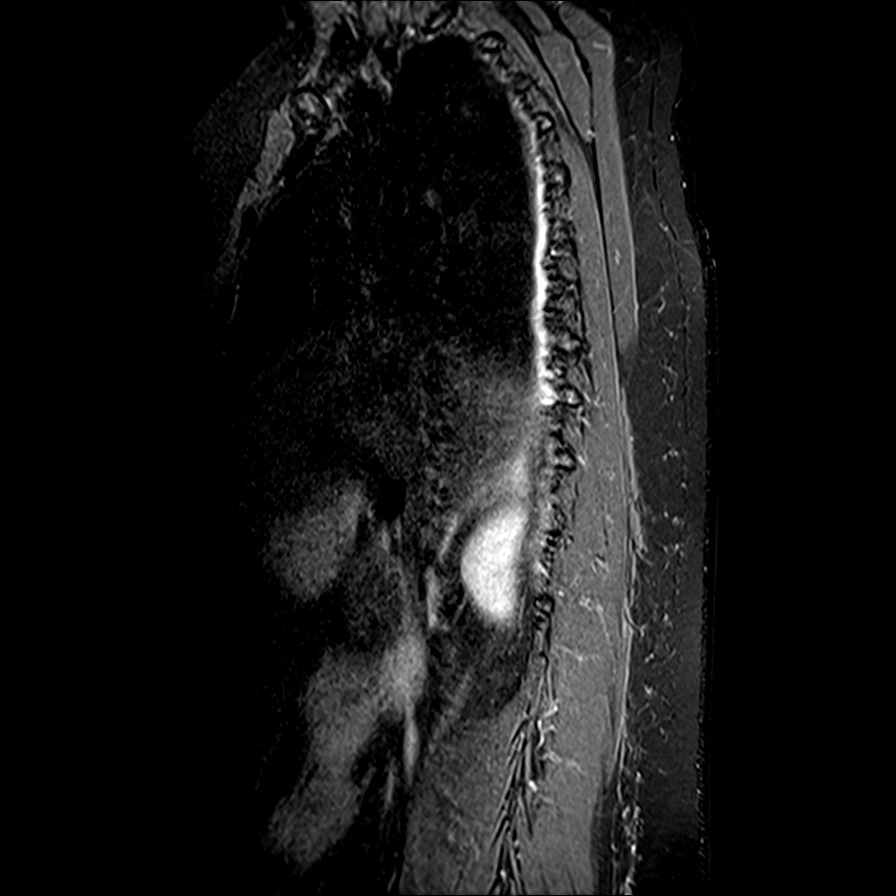

[Series 27: T2 · axial · 4.0mm · 0.59mm/px · z∈[-301,-91]mm · 8 of 39 slices shown (2 of 2)]
[im 1/39]
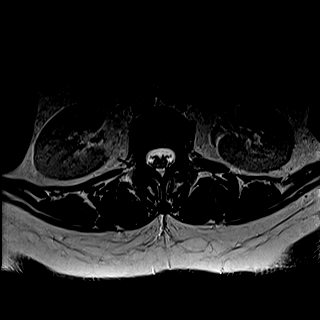
[im 6/39]
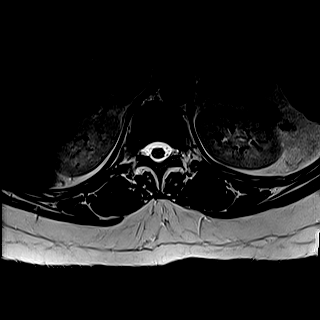
[im 12/39]
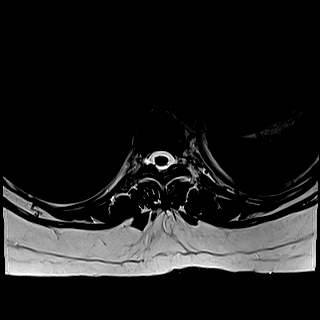
[im 18/39]
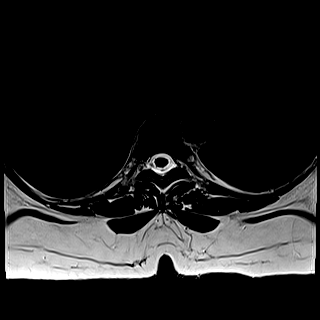
[im 21/39]
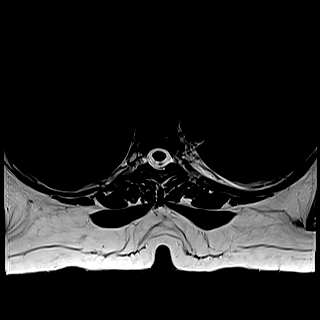
[im 27/39]
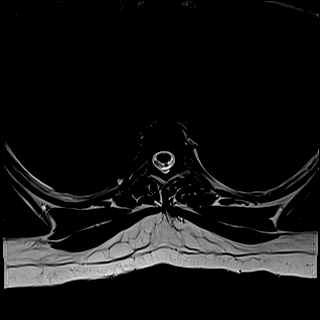
[im 33/39]
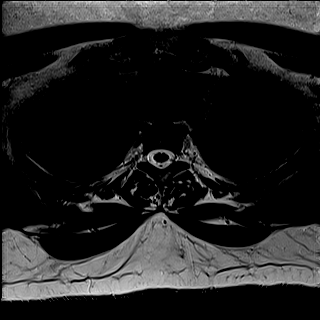
[im 39/39]
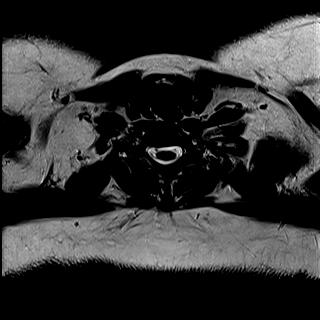

[23 of 48 positions shown; findings below may reference images not displayed]

FINDINGS: MRI CERVICAL SPINE FINDINGS

Alignment: Straightening of the normal cervical lordosis. No
listhesis.

Vertebrae: Vertebral body height maintained without acute or chronic
fracture. Bone marrow signal intensity somewhat heterogeneous and
diffusely decreased on T1 weighted imaging, nonspecific, but most
commonly related to anemia, smoking, or obesity. No discrete or
worrisome osseous lesions. No abnormal marrow edema.

Cord: Subtle focus of cord signal abnormality seen involving the
central and dorsal cord at the level of C7-T1, best appreciated on
sagittal STIR sequence (series 7, image 8). Possible slight cord
expansion at this level. Otherwise, signal intensity within the cord
is within normal limits with no other convincing cord signal
abnormality identified. Underlying cord caliber and morphology
within normal limits.

Posterior Fossa, vertebral arteries, paraspinal tissues:
Unremarkable.

Disc levels:

C2-C3: Unremarkable.

C3-C4:  Unremarkable.

C4-C5:  Unremarkable.

C5-C6: Mild disc bulge with uncovertebral spurring. No significant
canal or foraminal stenosis.

C6-C7: Mild disc bulge with uncovertebral spurring, asymmetric to
the right. No significant canal or foraminal stenosis.

C7-T1:  Unremarkable.

MRI THORACIC SPINE FINDINGS

Alignment: Straightening of the normal thoracic kyphosis. No
listhesis.

Vertebrae: Vertebral body height maintained without acute or chronic
fracture. Bone marrow signal intensity somewhat heterogeneous and
diffusely decreased on T1 weighted imaging, nonspecific, but most
commonly related to anemia, smoking or obesity. No discrete or
worrisome osseous lesions. No abnormal marrow edema.

Cord: Subtle patchy cord signal abnormality seen involving the
central/dorsal cord at the level of T2-3 (series 26, image 9).
Otherwise, no other convincing cord signal changes seen elsewhere
within the thoracic spine. Normal cord caliber and morphology. Conus
terminates at the level of L1-2.

Paraspinal and other soft tissues: Paraspinous soft tissues within
normal limits. Small layering bilateral pleural effusions noted.
Subcentimeter simple right renal cyst noted, benign in appearance,
no follow-up imaging recommended.

Disc levels:

No significant disc pathology seen within the thoracic spine.
Intervertebral discs are well hydrated with preserved disc height.
No disc bulge or focal disc herniation. No canal or foraminal
stenosis.
IMPRESSION: 1. Subtle foci of cord signal abnormality at the levels of C7-T1 and
T2-3, nonspecific, but suspicious for possible demyelinating
disease/multiple sclerosis. There is question of subtle associated
cord expansion about the lesion at C7-T1, which could reflect active
demyelination. Correlation with postcontrast imaging suggested.
2. Mild noncompressive disc bulging at C5-6 and C6-7 without
stenosis.
3. Small layering bilateral pleural effusions.

## 2022-01-29 IMAGING — MR MR CERVICAL SPINE W/O CM
5 series · 34 of 48 positions shown · non-contrast
Comparison: Comparison made with concomitant MRI of the brain
performed at the same time.

CLINICAL DATA: Initial evaluation for acute neuro deficit, evaluate
for multiple sclerosis.

EXAM:
MRI CERVICAL AND THORACIC SPINE WITHOUT CONTRAST
TECHNIQUE: Multiplanar and multiecho pulse sequences of the cervical spine, to
include the craniocervical junction and cervicothoracic junction,
and the thoracic spine, were obtained without intravenous contrast.

[Series 5: T2 · sagittal · 3.0mm · 0.69mm/px · 6 of 15 slices shown (1 of 2)]
[im 1/15]
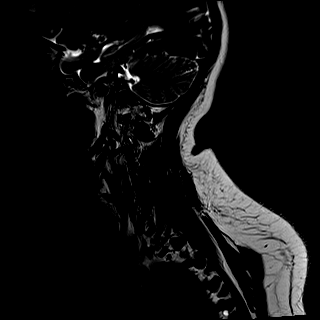
[im 3/15]
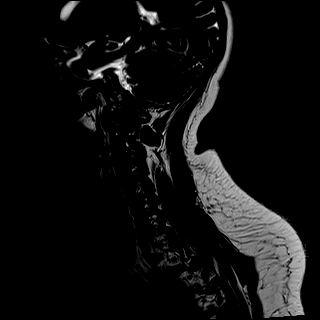
[im 6/15]
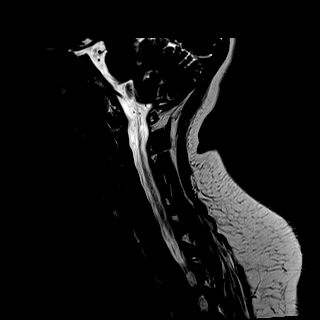
[im 9/15]
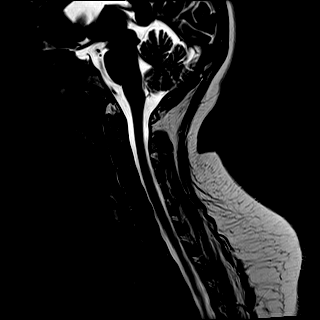
[im 12/15]
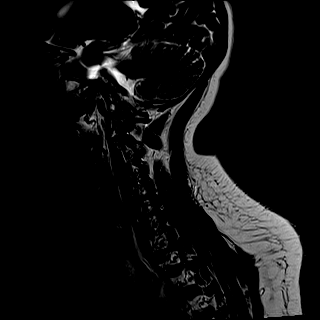
[im 15/15]
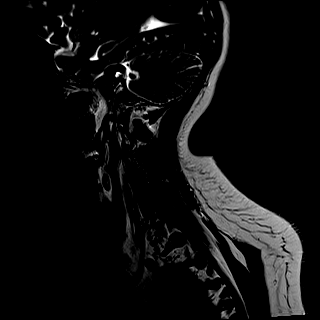

[Series 6: T1 · sagittal · 3.0mm · 0.69mm/px · 6 of 15 slices shown]
[im 1/15]
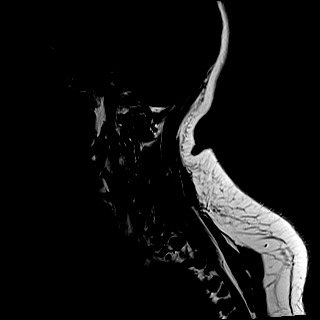
[im 3/15]
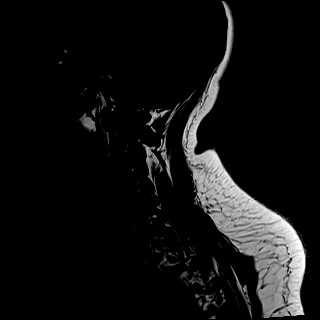
[im 6/15]
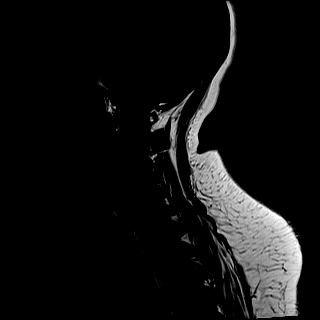
[im 9/15]
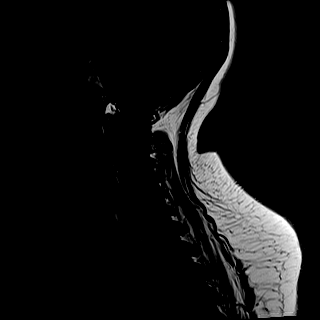
[im 12/15]
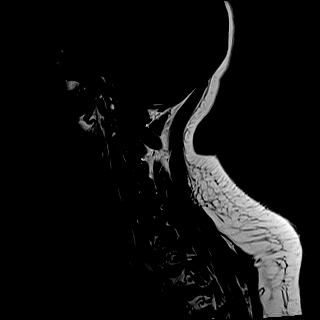
[im 15/15]
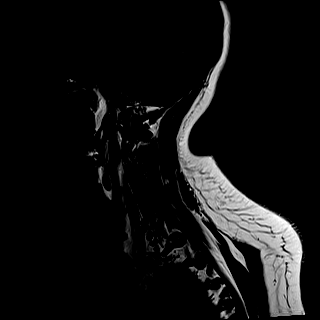

[Series 7: STIR · sagittal · 3.0mm · 0.86mm/px · 6 of 15 slices shown]
[im 1/15]
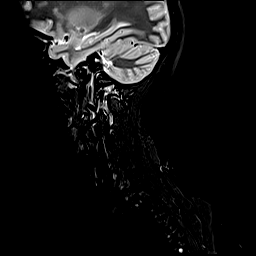
[im 3/15]
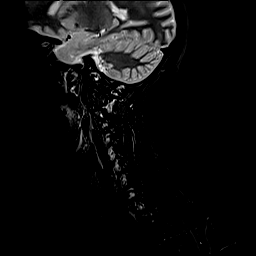
[im 6/15]
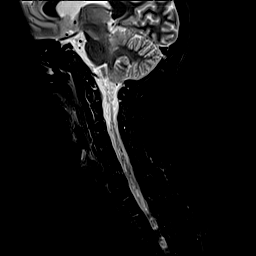
[im 9/15]
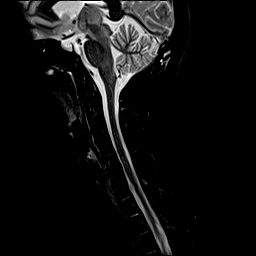
[im 12/15]
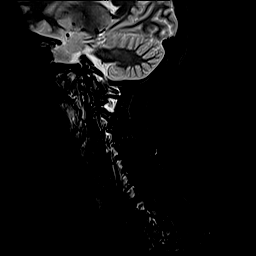
[im 15/15]
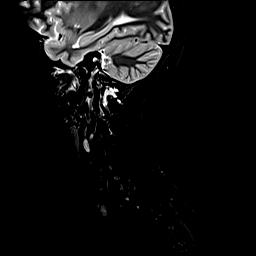

[Series 8: T2 · axial · 3.0mm · 0.72mm/px · z∈[-130,-24]mm · 9 of 35 slices shown (2 of 2)]
[im 1/35]
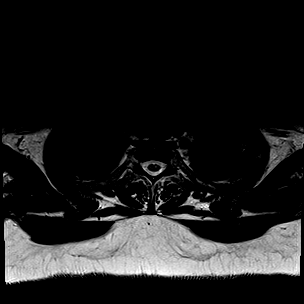
[im 5/35]
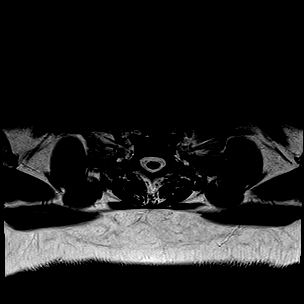
[im 10/35]
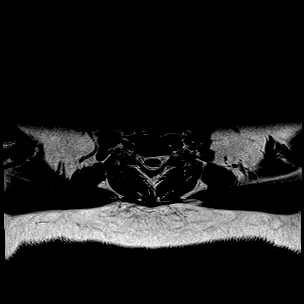
[im 15/35]
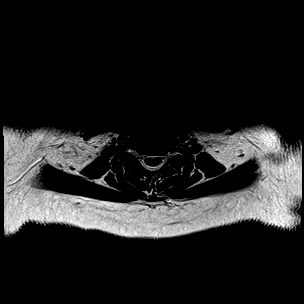
[im 18/35]
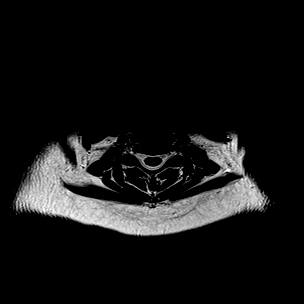
[im 20/35]
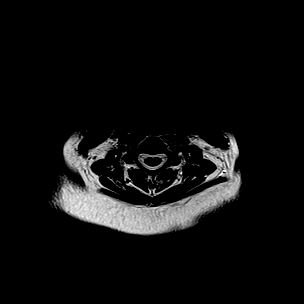
[im 25/35]
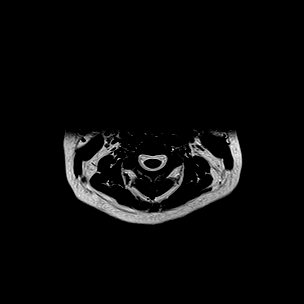
[im 30/35]
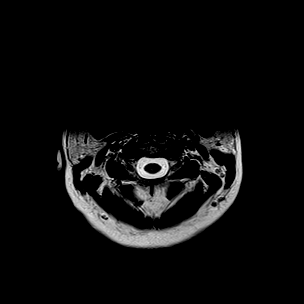
[im 35/35]
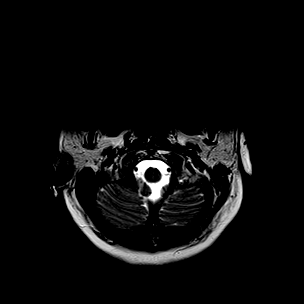

[Series 9: GRE · axial · 3.0mm · 0.39mm/px · z∈[-127,-36]mm · 7 of 35 slices shown]
[im 1/35]
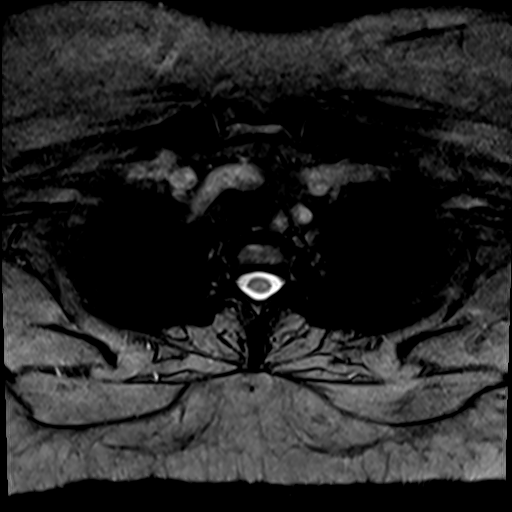
[im 5/35]
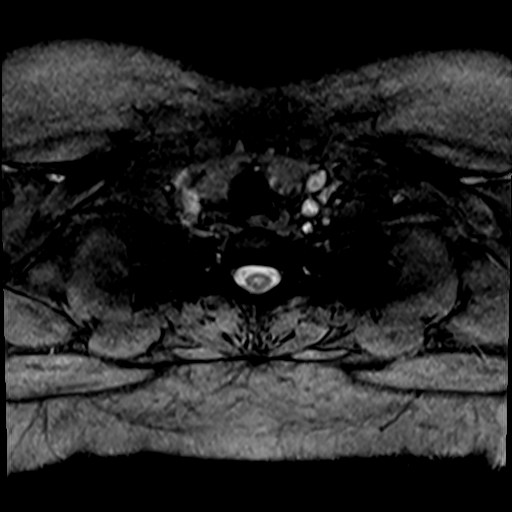
[im 10/35]
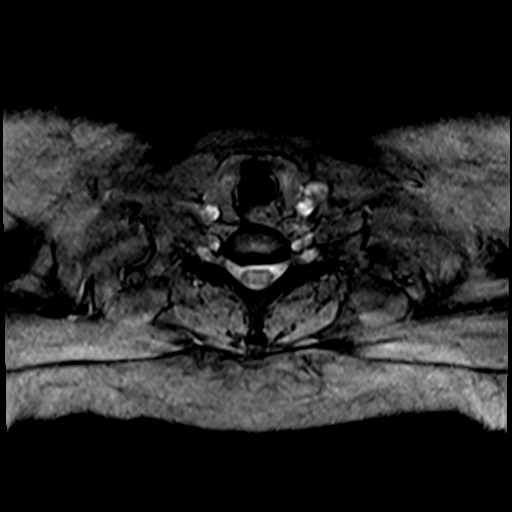
[im 15/35]
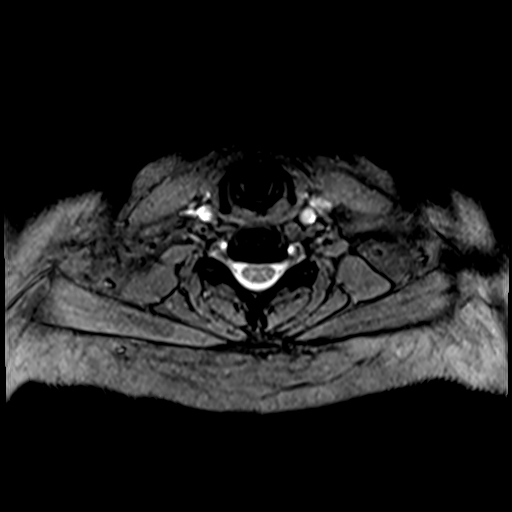
[im 20/35]
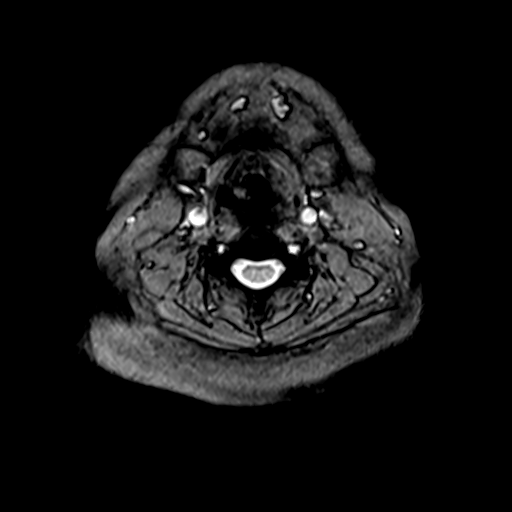
[im 25/35]
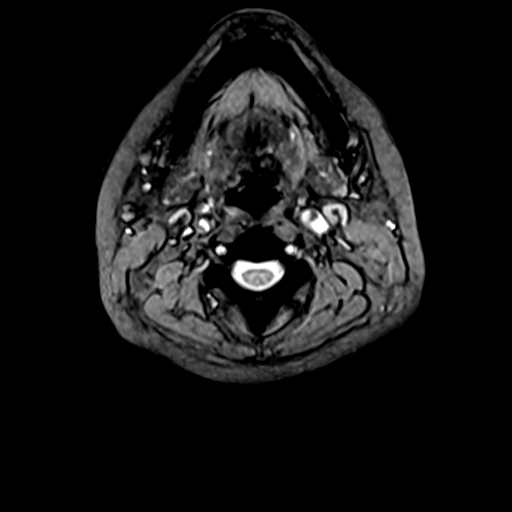
[im 30/35]
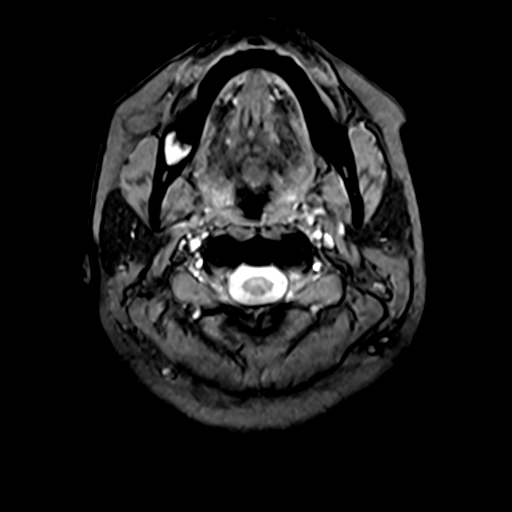

[34 of 48 positions shown; findings below may reference images not displayed]

FINDINGS: MRI CERVICAL SPINE FINDINGS

Alignment: Straightening of the normal cervical lordosis. No
listhesis.

Vertebrae: Vertebral body height maintained without acute or chronic
fracture. Bone marrow signal intensity somewhat heterogeneous and
diffusely decreased on T1 weighted imaging, nonspecific, but most
commonly related to anemia, smoking, or obesity. No discrete or
worrisome osseous lesions. No abnormal marrow edema.

Cord: Subtle focus of cord signal abnormality seen involving the
central and dorsal cord at the level of C7-T1, best appreciated on
sagittal STIR sequence (series 7, image 8). Possible slight cord
expansion at this level. Otherwise, signal intensity within the cord
is within normal limits with no other convincing cord signal
abnormality identified. Underlying cord caliber and morphology
within normal limits.

Posterior Fossa, vertebral arteries, paraspinal tissues:
Unremarkable.

Disc levels:

C2-C3: Unremarkable.

C3-C4:  Unremarkable.

C4-C5:  Unremarkable.

C5-C6: Mild disc bulge with uncovertebral spurring. No significant
canal or foraminal stenosis.

C6-C7: Mild disc bulge with uncovertebral spurring, asymmetric to
the right. No significant canal or foraminal stenosis.

C7-T1:  Unremarkable.

MRI THORACIC SPINE FINDINGS

Alignment: Straightening of the normal thoracic kyphosis. No
listhesis.

Vertebrae: Vertebral body height maintained without acute or chronic
fracture. Bone marrow signal intensity somewhat heterogeneous and
diffusely decreased on T1 weighted imaging, nonspecific, but most
commonly related to anemia, smoking or obesity. No discrete or
worrisome osseous lesions. No abnormal marrow edema.

Cord: Subtle patchy cord signal abnormality seen involving the
central/dorsal cord at the level of T2-3 (series 26, image 9).
Otherwise, no other convincing cord signal changes seen elsewhere
within the thoracic spine. Normal cord caliber and morphology. Conus
terminates at the level of L1-2.

Paraspinal and other soft tissues: Paraspinous soft tissues within
normal limits. Small layering bilateral pleural effusions noted.
Subcentimeter simple right renal cyst noted, benign in appearance,
no follow-up imaging recommended.

Disc levels:

No significant disc pathology seen within the thoracic spine.
Intervertebral discs are well hydrated with preserved disc height.
No disc bulge or focal disc herniation. No canal or foraminal
stenosis.
IMPRESSION: 1. Subtle foci of cord signal abnormality at the levels of C7-T1 and
T2-3, nonspecific, but suspicious for possible demyelinating
disease/multiple sclerosis. There is question of subtle associated
cord expansion about the lesion at C7-T1, which could reflect active
demyelination. Correlation with postcontrast imaging suggested.
2. Mild noncompressive disc bulging at C5-6 and C6-7 without
stenosis.
3. Small layering bilateral pleural effusions.

## 2022-01-29 IMAGING — MR MR HEAD W/O CM
10 of 11 series · 42 of 48 positions shown · non-contrast
Comparison: Prior CTs from earlier the same day.

CLINICAL DATA: Initial evaluation for headache, neuro deficit.

EXAM:
MRI HEAD WITHOUT CONTRAST
TECHNIQUE: Multiplanar, multiecho pulse sequences of the brain and surrounding
structures were obtained without intravenous contrast.

[Series 5: DWI · axial · 3.0mm · 0.88mm/px · z∈[-56,+79]mm · 9 of 92 slices shown (1 of 4)]
[im 1/92]
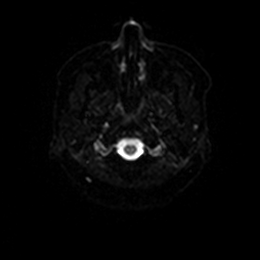
[im 12/92]
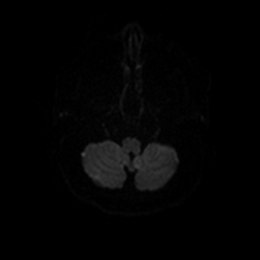
[im 23/92]
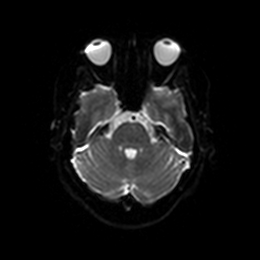
[im 35/92]
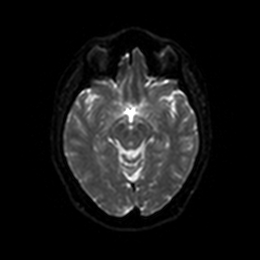
[im 46/92]
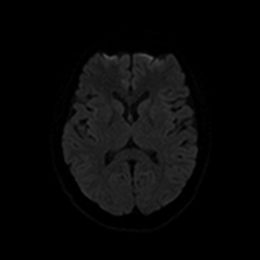
[im 57/92]
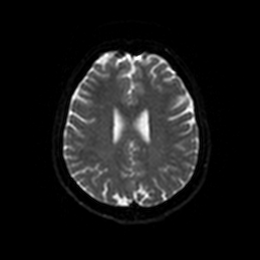
[im 69/92]
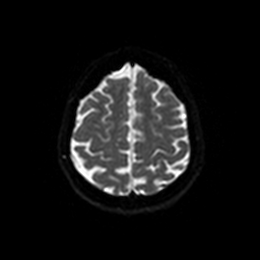
[im 80/92]
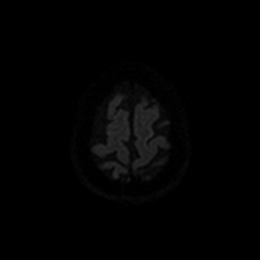
[im 92/92]
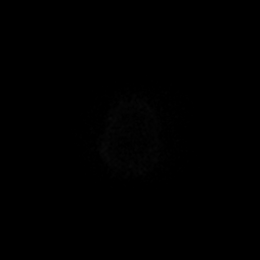

[Series 6: DWI · axial · 3.0mm · 0.88mm/px · z∈[-56,+79]mm · 4 of 46 slices shown (2 of 4)]
[im 1/46]
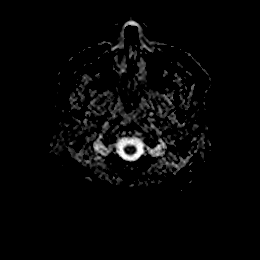
[im 16/46]
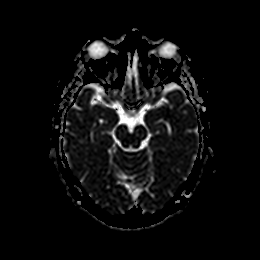
[im 31/46]
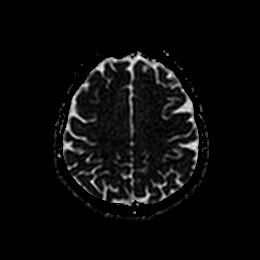
[im 46/46]
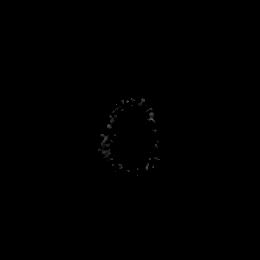

[Series 7: DWI · coronal · 4.0mm · 0.88mm/px · 6 of 64 slices shown (3 of 4)]
[im 1/64]
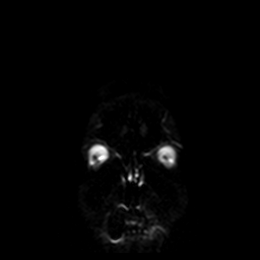
[im 13/64]
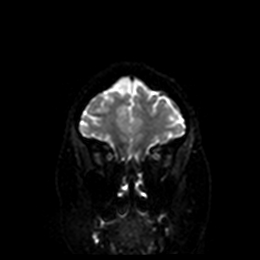
[im 26/64]
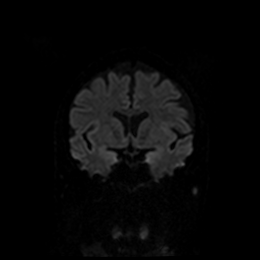
[im 38/64]
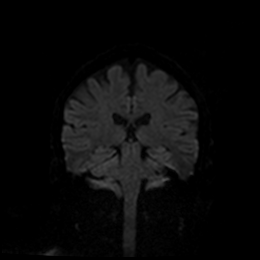
[im 51/64]
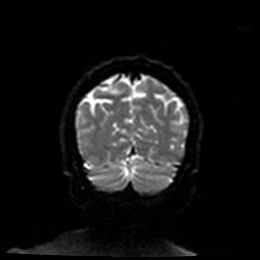
[im 64/64]
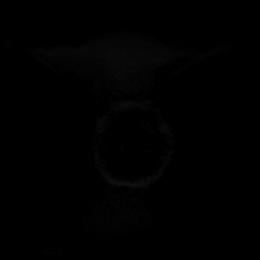

[Series 8: DWI · coronal · 4.0mm · 0.88mm/px · 3 of 32 slices shown (4 of 4)]
[im 1/32]
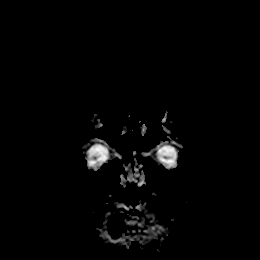
[im 16/32]
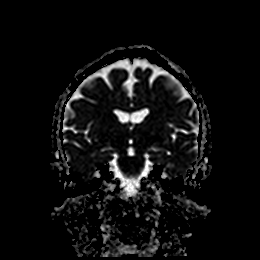
[im 32/32]
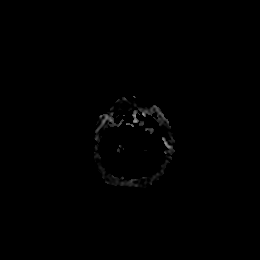

[Series 9: T1 · sagittal · 5.0mm · 0.75mm/px · 2 of 23 slices shown]
[im 1/23]
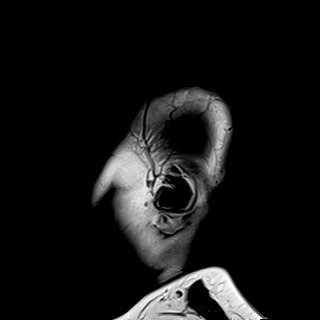
[im 23/23]
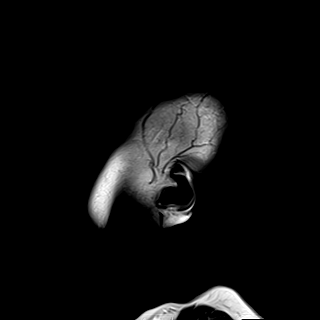

[Series 10: T2 · axial · 5.0mm · 0.72mm/px · z∈[-60,+83]mm · 2 of 25 slices shown (1 of 2)]
[im 1/25]
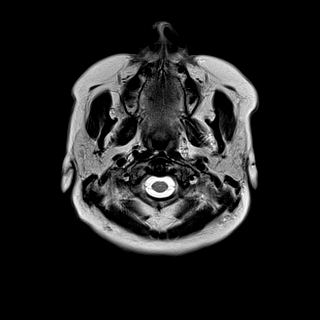
[im 25/25]
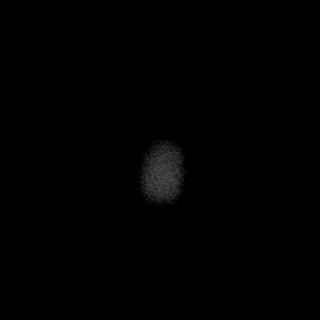

[Series 11: FLAIR · axial · 5.0mm · 0.45mm/px · z∈[-59,+85]mm · 2 of 25 slices shown]
[im 1/25]
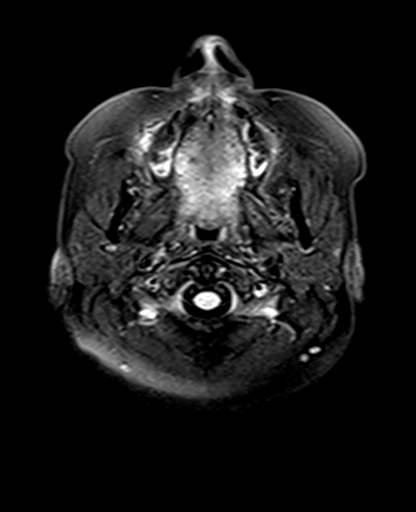
[im 25/25]
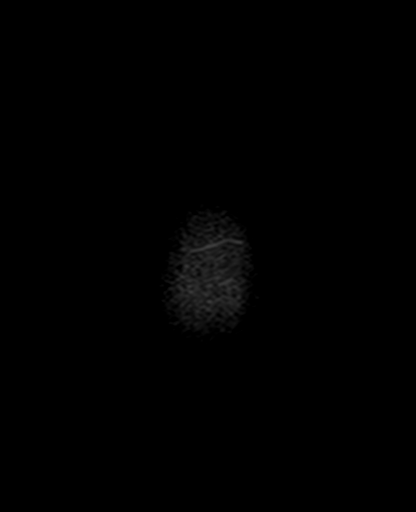

[Series 13: pha_images · axial · 3.0mm · 0.90mm/px · z∈[-75,+98]mm · 5 of 59 slices shown]
[im 1/59]
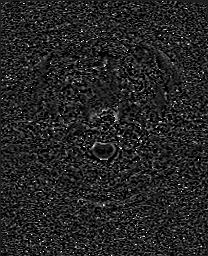
[im 15/59]
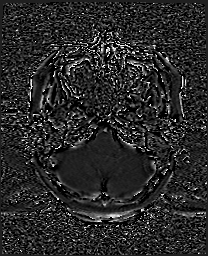
[im 30/59]
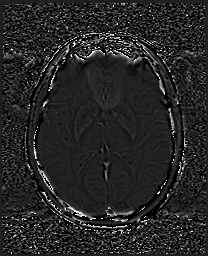
[im 44/59]
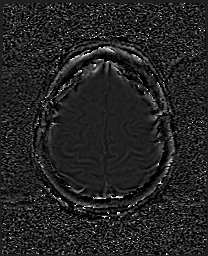
[im 59/59]
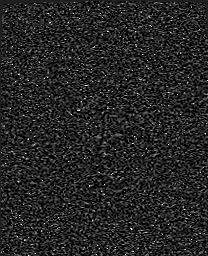

[Series 14: swi_images · axial · 3.0mm · 0.90mm/px · z∈[-75,+101]mm · 6 of 60 slices shown]
[im 1/60]
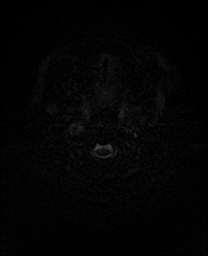
[im 12/60]
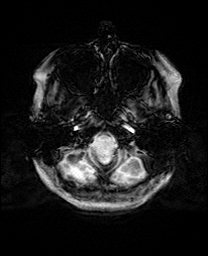
[im 24/60]
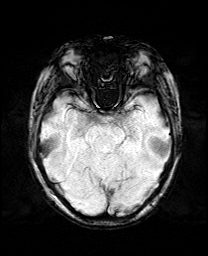
[im 36/60]
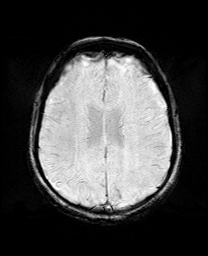
[im 48/60]
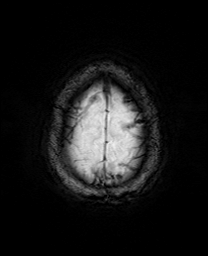
[im 60/60]
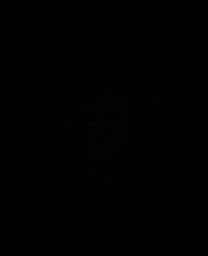

[Series 17: T2 · coronal · 5.0mm · 0.34mm/px · 3 of 29 slices shown (2 of 2)]
[im 1/29]
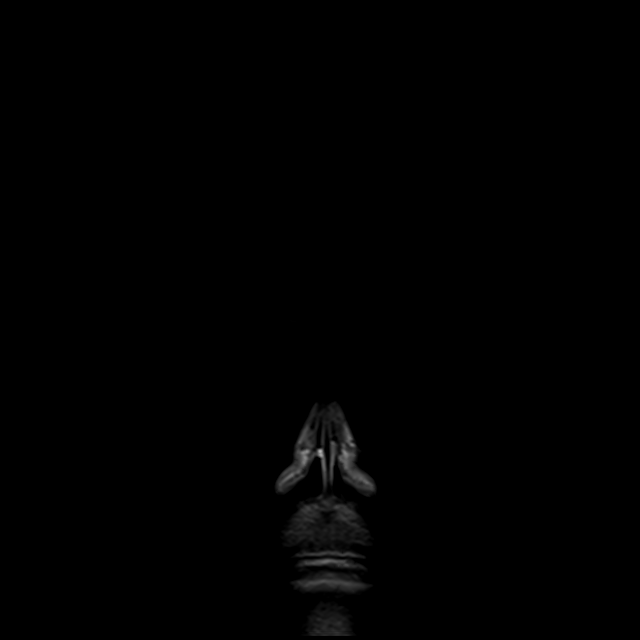
[im 15/29]
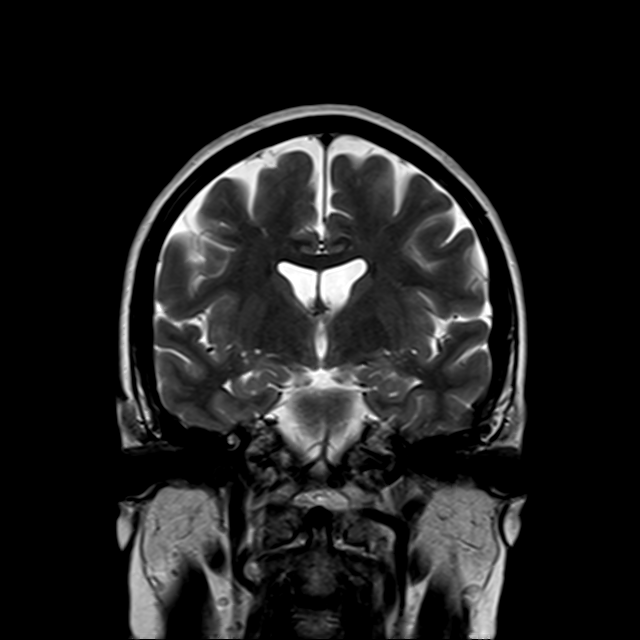
[im 29/29]
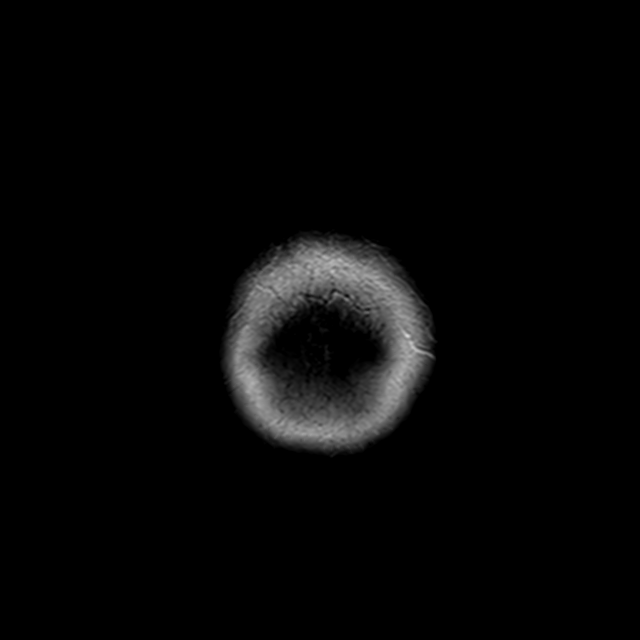

[42 of 48 positions shown; findings below may reference images not displayed]

FINDINGS: Brain: Cerebral volume within normal limits. No focal parenchymal
signal abnormality. No abnormal foci of restricted diffusion to
suggest acute or subacute ischemia. Gray-white matter
differentiation maintained. No encephalomalacia to suggest prior
cortical infarction or other insult. No acute or chronic
intracranial blood products.

No mass lesion, midline shift or mass effect. No hydrocephalus or
extra-axial fluid collection. Pituitary gland suprasellar region
within normal limits. Midline structures intact and normal.

Vascular: Major intracranial vascular flow voids are maintained.

Skull and upper cervical spine: Craniocervical junction within
normal limits. Bone marrow signal intensity grossly normal. No scalp
soft tissue abnormality.

Sinuses/Orbits: Globes and orbital soft tissues within normal
limits. Paranasal sinuses are clear. No mastoid effusion. Inner ear
structures grossly normal.

Other: None.
IMPRESSION: Normal brain MRI.  No acute intracranial abnormality.

## 2022-01-29 IMAGING — CT CT ANGIO HEAD-NECK (W OR W/O PERF)
3 of 7 series · 10 of 36 positions shown · IV contrast (OMNI 350)
Comparison: CT head without contrast [DATE]

CLINICAL DATA: Neuro deficit, acute stroke suspected. Severe
headache. Slurred speech

EXAM:
CT ANGIOGRAPHY HEAD AND NECK
TECHNIQUE: Multidetector CT imaging of the head and neck was performed using
the standard protocol during bolus administration of intravenous
contrast. Multiplanar CT image reconstructions and MIPs were
obtained to evaluate the vascular anatomy. Carotid stenosis
measurements (when applicable) are obtained utilizing NASCET
criteria, using the distal internal carotid diameter as the
denominator.

[Series 3: cta neck · axial · 0.47mm/px · z∈[-257,-163]mm · 2 of 142 slices shown]
[im 48/142  soft-tissue]
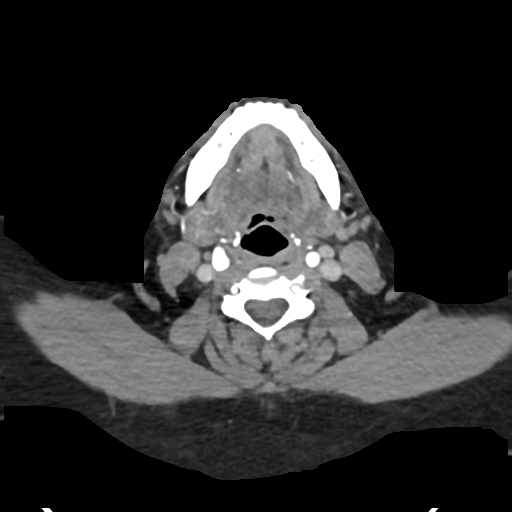
[im 95/142  soft-tissue]
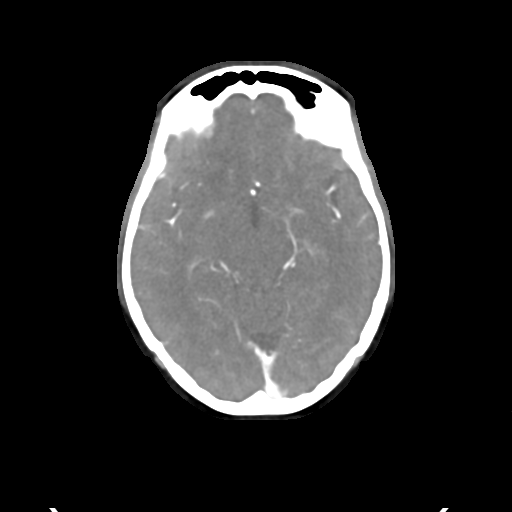

[Series 5: cta neck axial · axial · 0.39mm/px · z∈[-316,-113]mm · 6 of 286 slices shown]
[im 41/286  soft-tissue]
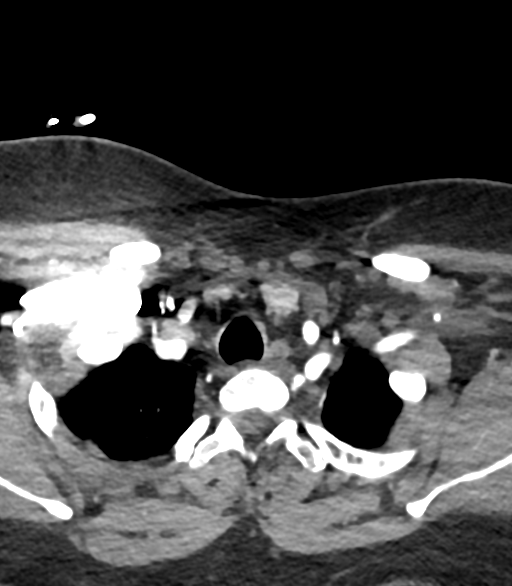
[im 82/286  bone]
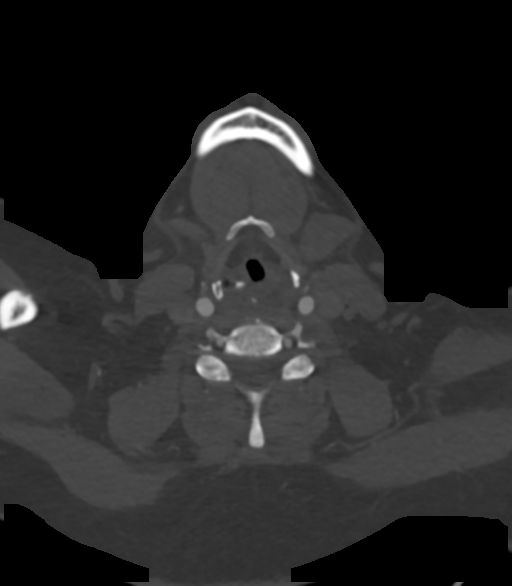
[im 123/286  soft-tissue]
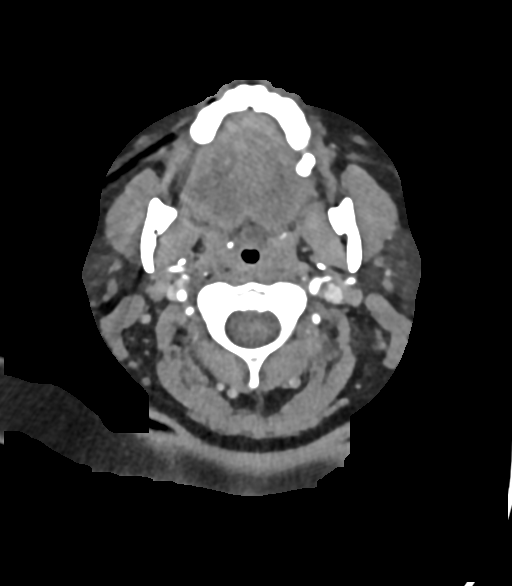
[im 163/286  bone]
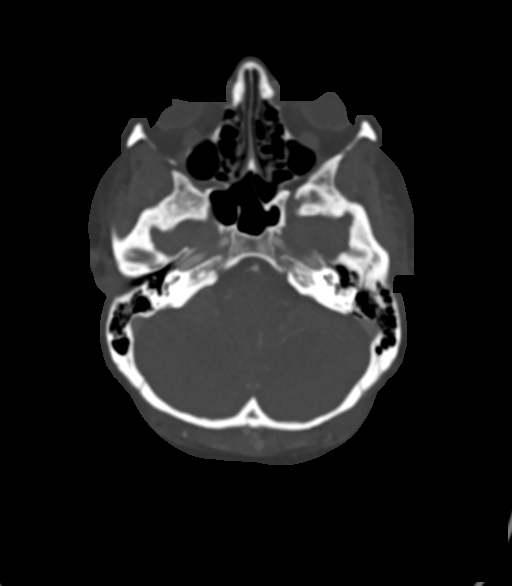
[im 204/286  soft-tissue]
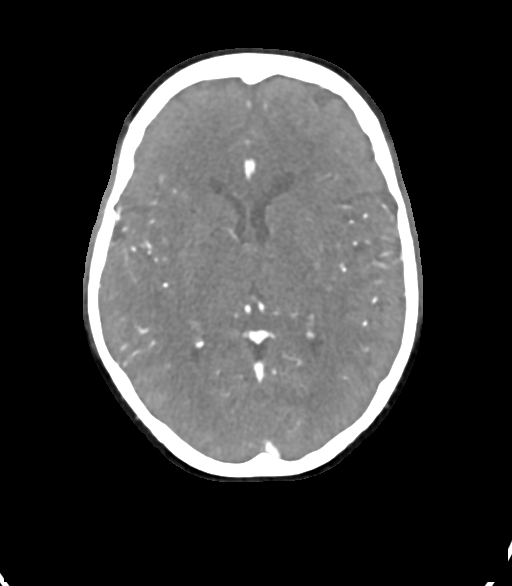
[im 245/286  bone]
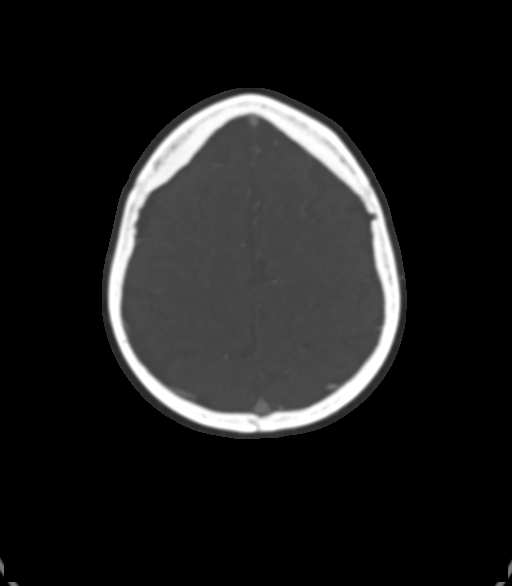

[Series 7: cta neck sagittal · sagittal · 0.45mm/px · 2 of 201 slices shown]
[im 54/201  soft-tissue]
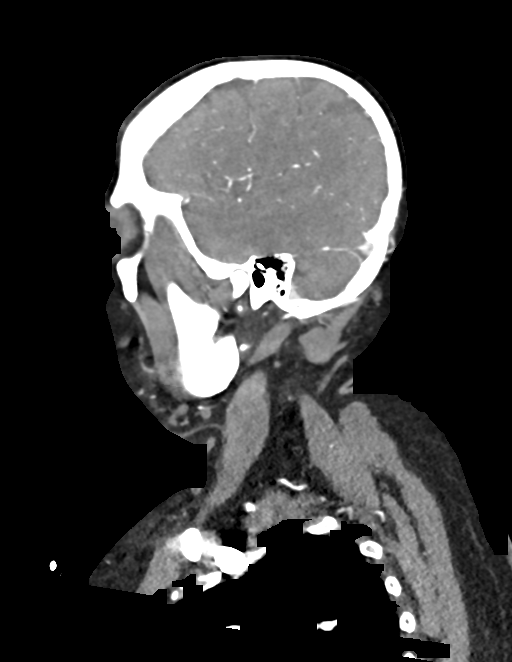
[im 148/201  soft-tissue]
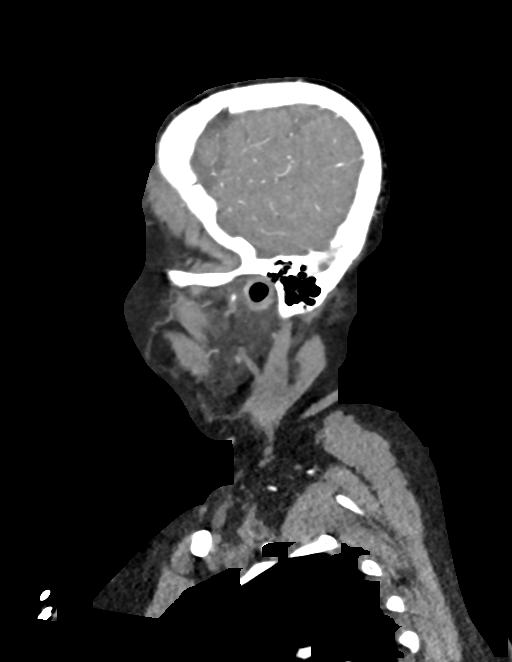

[10 of 36 positions shown; findings below may reference images not displayed]

RADIATION DOSE REDUCTION: This exam was performed according to the
departmental dose-optimization program which includes automated
exposure control, adjustment of the mA and/or kV according to
patient size and/or use of iterative reconstruction technique.

CONTRAST:  100mL OMNIPAQUE IOHEXOL 350 MG/ML SOLN
FINDINGS: CTA NECK FINDINGS

Aortic arch: A 3 vessel arch configuration is present. No
significant vascular calcifications are present. No stenosis or
aneurysm present.

Right carotid system: Right common carotid artery is within normal
limits. Bifurcation is unremarkable. Mild tortuosity is present
cervical right ICA without significant stenosis.

Left carotid system: The left common carotid artery is within normal
limits. Bifurcation is unremarkable. Mild tortuosity is present
cervical left ICA without significant stenosis.

Vertebral arteries: The vertebral arteries are codominant. Both
vertebral arteries originate from the subclavians without
significant stenosis. No significant stenosis or vascular injury is
present to either vertebral artery in the neck.

Skeleton: Vertebral body heights and alignment are normal. No focal
osseous lesions are present.

Other neck: Soft tissues the neck are otherwise unremarkable.
Salivary glands are within normal limits. Thyroid is normal. No
significant adenopathy is present. No focal mucosal or submucosal
lesions are present.

Upper chest: Lung apices demonstrate minimal dependent atelectasis.
No airspace disease or nodule is present. Thoracic inlet is within
normal limits.

Review of the MIP images confirms the above findings

CTA HEAD FINDINGS

Anterior circulation: The internal carotid arteries are within
normal limits from the skull base through the ICA termini
bilaterally. The A1 and M1 segments are normal. The anterior
communicating artery is patent. MCA bifurcations are within normal
limits. ACA and MCA branch vessels are unremarkable.

Posterior circulation: The vertebral arteries are codominant. PICA
origins are visualized and normal. Vertebrobasilar junction is
normal. The basilar artery is within normal limits. Both posterior
cerebral arteries originate from the basilar tip. The PCA branch
vessels are within normal limits bilaterally.

Venous sinuses: The dural sinuses are patent. The straight sinus
deep cerebral veins are intact. Cortical veins are within normal
limits. No significant vascular malformation is evident.

Anatomic variants: None

Review of the MIP images confirms the above findings
IMPRESSION: 1. Mild tortuosity of the cervical internal carotid arteries
bilaterally without significant stenosis. This is nonspecific, but
most likely related to hypertension.
2. Otherwise normal CTA of the neck.
3. Normal CTA Circle of Willis without significant proximal
stenosis, aneurysm, or branch vessel occlusion.

## 2022-01-29 IMAGING — CT CT VENOGRAM HEAD
1 of 7 series · 2 of 33 positions shown · IV contrast (OMNI 350)
Comparison: None.

CLINICAL DATA: Severe headache. Slurred speech. Question dural
sinus thrombosis.

EXAM:
CT VENOGRAM HEAD
TECHNIQUE: Venographic phase images of the brain were obtained following the
administration of intravenous contrast. Multiplanar reformats and
maximum intensity projections were generated.

[Series 5: head with · axial · 0.40mm/px · z∈[-150,-94]mm · 2 of 84 slices shown]
[im 28/84  soft-tissue]
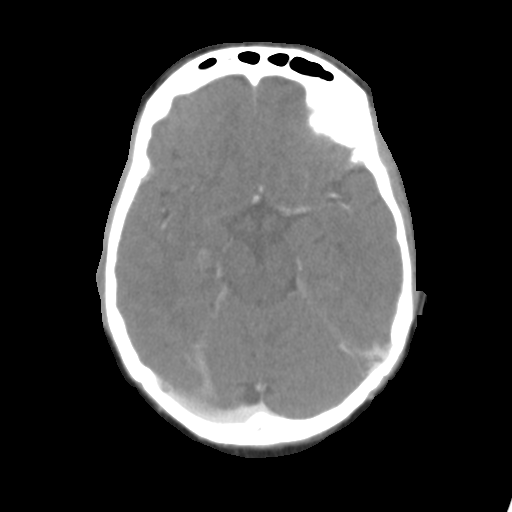
[im 56/84  bone]
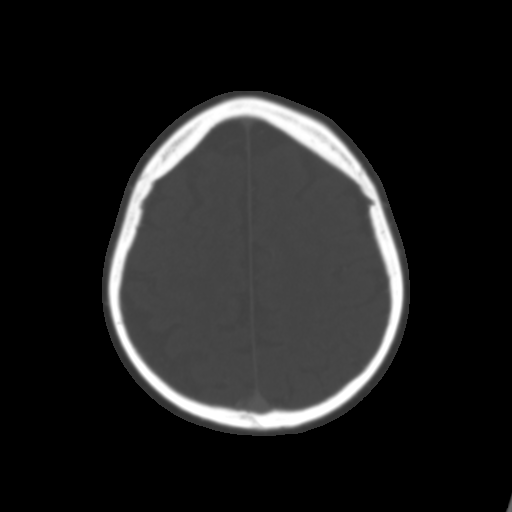

[2 of 33 positions shown; findings below may reference images not displayed]

RADIATION DOSE REDUCTION: This exam was performed according to the
departmental dose-optimization program which includes automated
exposure control, adjustment of the mA and/or kV according to
patient size and/or use of iterative reconstruction technique.

CONTRAST:  100mL OMNIPAQUE IOHEXOL 350 MG/ML SOLN
FINDINGS: Delayed images demonstrate patent dural sinuses bilaterally.
Bilateral occipital veins noted. No significant focal stenosis or
thrombus.
IMPRESSION: Normal CT venogram of the head.

## 2022-01-29 IMAGING — CT CT HEAD W/O CM
4 series · 15 of 47 positions shown, 17 images · non-contrast
Comparison: None.

CLINICAL DATA: Neuro deficit, acute stroke suspected



[Series 2: head wo · axial · 0.30mm/px · z∈[-637,-545]mm · 6 of 29 slices shown, 8 images]
[im 5/29  brain]
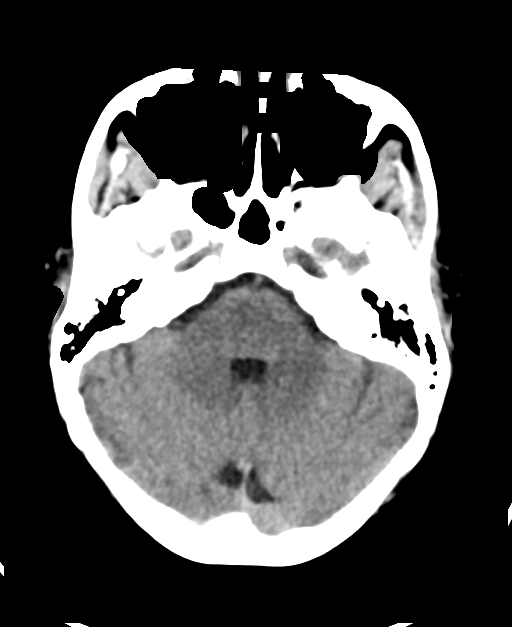
[im 5/29  bone]
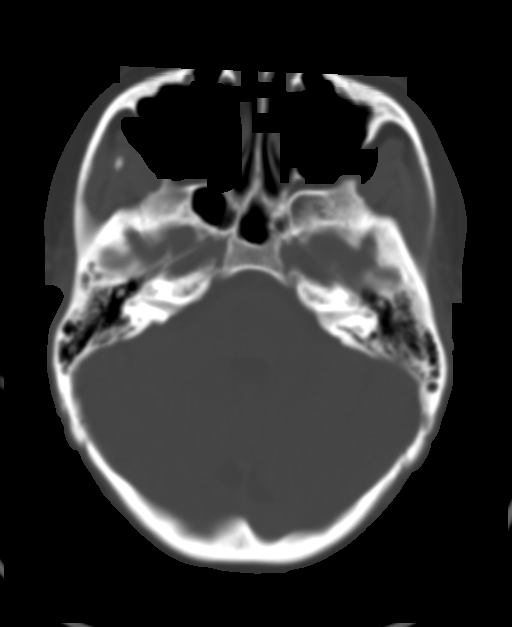
[im 9/29  brain]
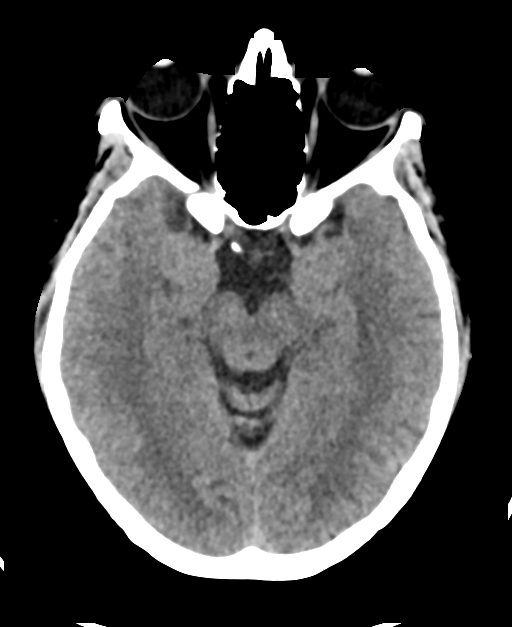
[im 13/29  brain]
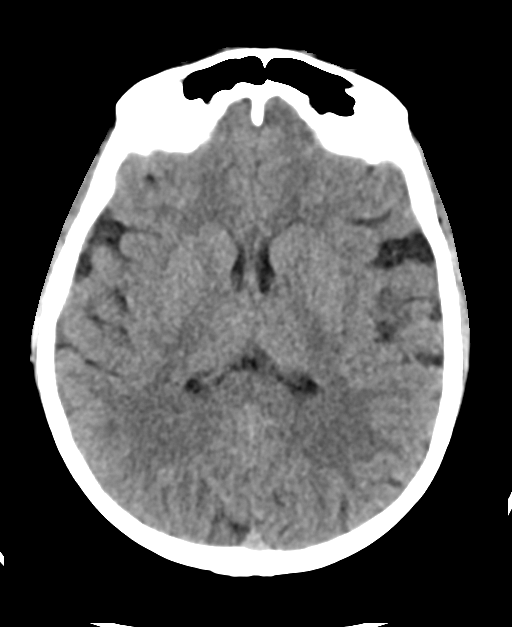
[im 17/29  brain]
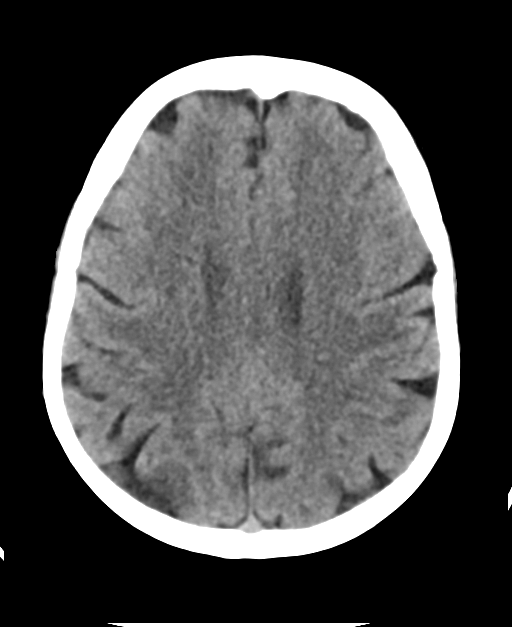
[im 21/29  brain]
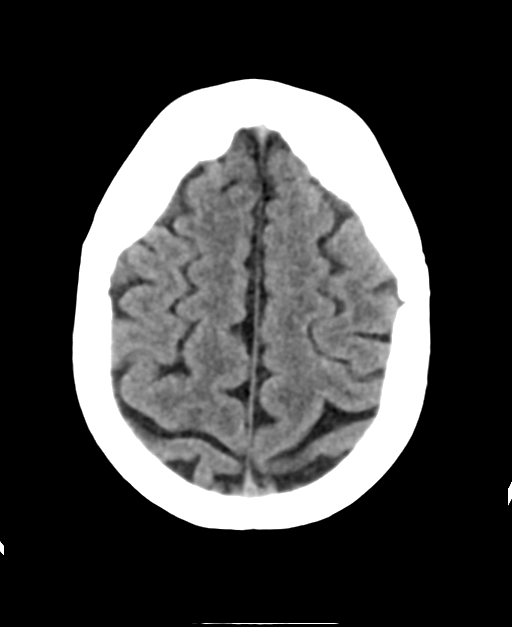
[im 21/29  bone]
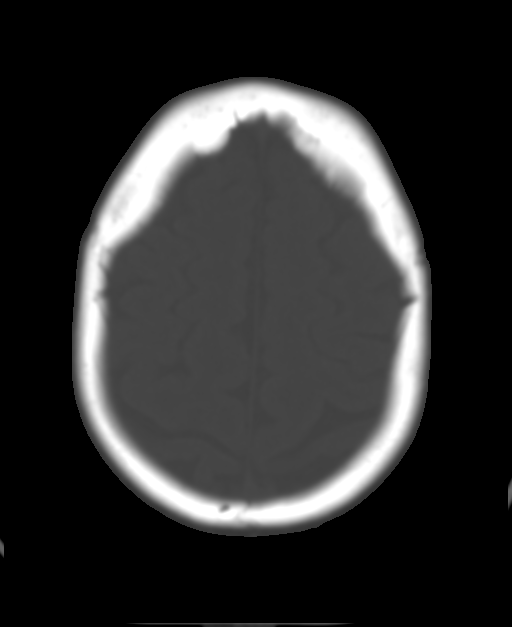
[im 25/29  brain]
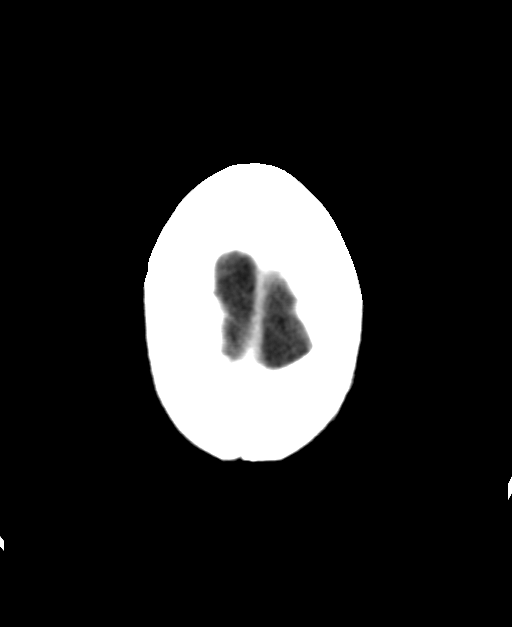

[Series 3: head bone · axial · 0.39mm/px · z∈[-613,-577]mm · 3 of 76 slices shown]
[im 8/76  bone]
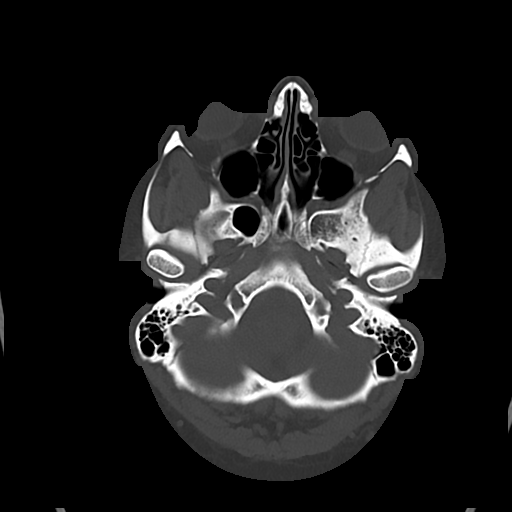
[im 15/76  bone]
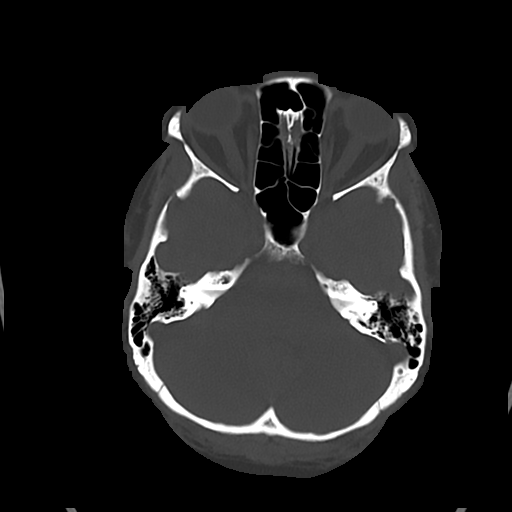
[im 26/76  bone]
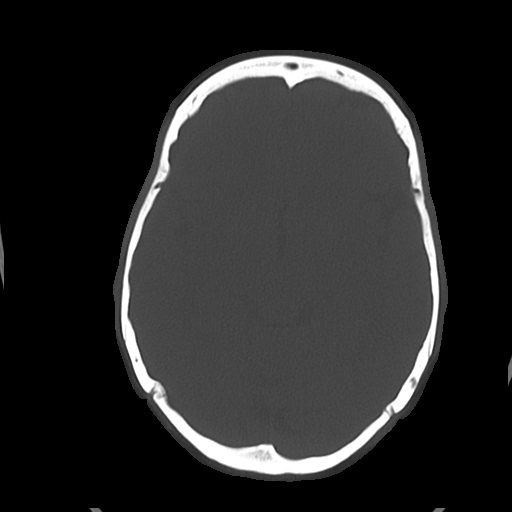

[Series 4: cor soft · coronal · 0.30mm/px · 3 of 63 slices shown]
[im 21/63  brain]
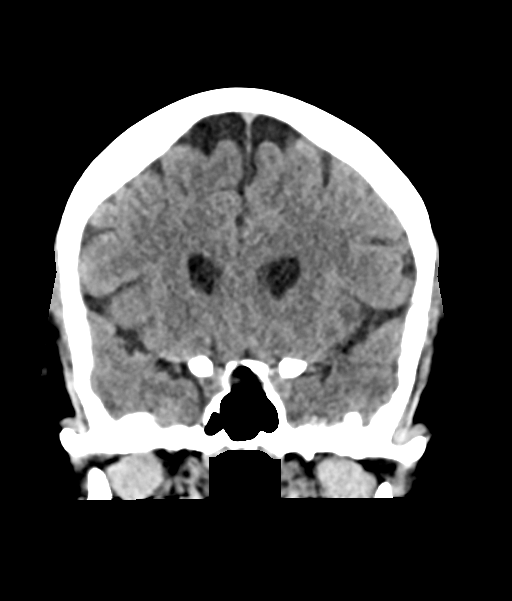
[im 28/63  brain]
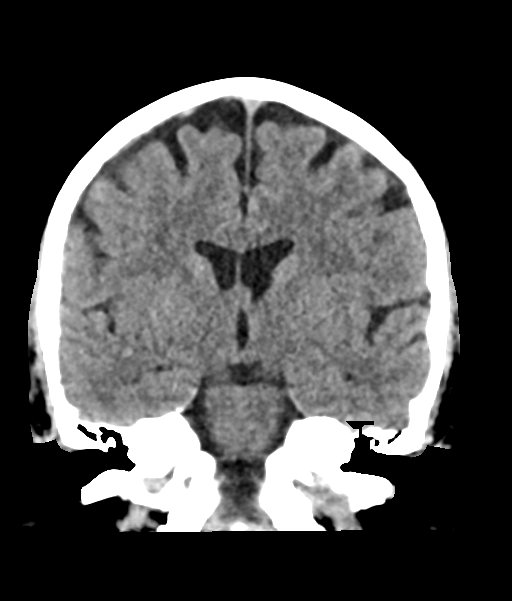
[im 35/63  brain]
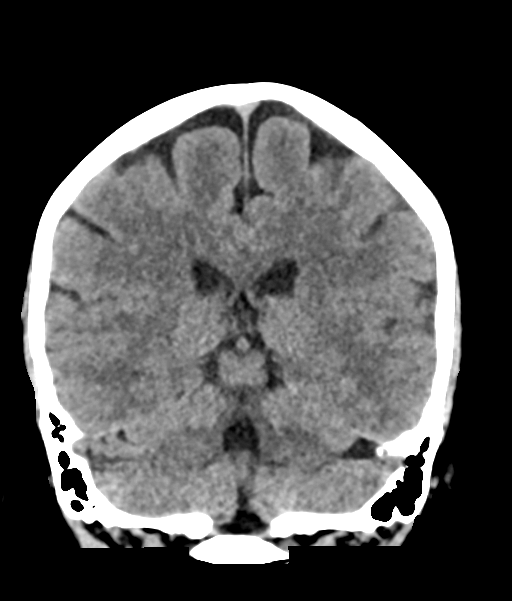

[Series 5: sag soft · sagittal · 0.32mm/px · 3 of 53 slices shown]
[im 18/53  brain]
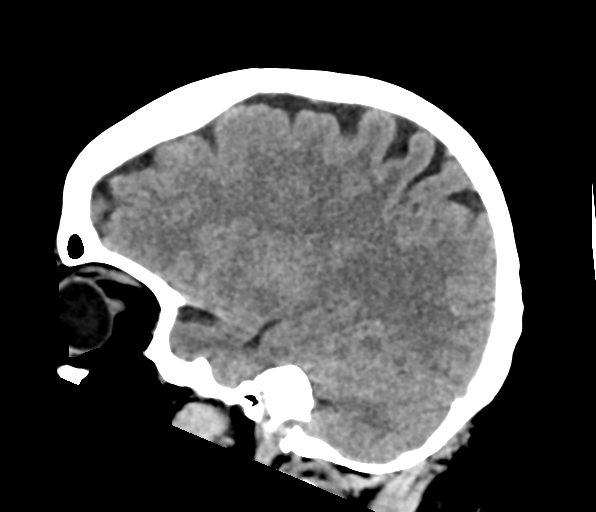
[im 27/53  brain]
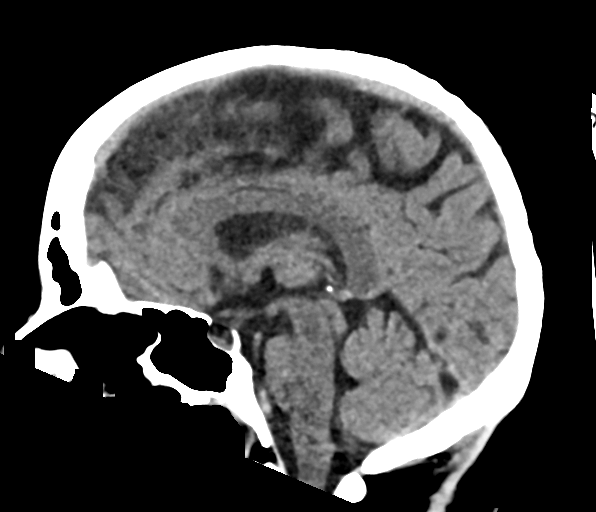
[im 35/53  brain]
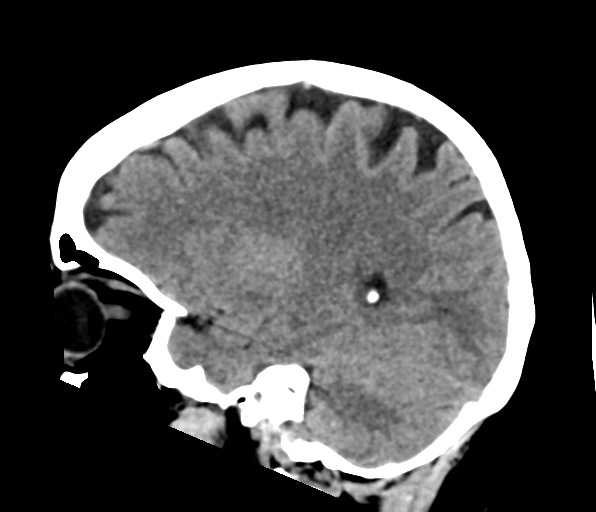

[15 of 47 positions shown; findings below may reference images not displayed]

FINDINGS: Brain: No evidence of acute infarction, hemorrhage, hydrocephalus,
extra-axial collection or mass lesion/mass effect.

Vascular: No hyperdense vessel or unexpected calcification.

Skull: Normal. Negative for fracture or focal lesion.

Sinuses/Orbits: No acute finding.

Other: None.
IMPRESSION: No acute intracranial pathology. No noncontrast CT evidence of acute
stroke or hemorrhage.

## 2022-01-29 MED ORDER — PROCHLORPERAZINE EDISYLATE 10 MG/2ML IJ SOLN
10.0000 mg | Freq: Once | INTRAMUSCULAR | Status: AC
  Administered 2022-01-29: 10 mg via INTRAVENOUS
  Filled 2022-01-29: qty 2

## 2022-01-29 MED ORDER — ACETAMINOPHEN 650 MG RE SUPP: 650.0000 mg | RECTAL | Status: DC | PRN

## 2022-01-29 MED ORDER — THIAMINE HCL 100 MG/ML IJ SOLN
500.0000 mg | Freq: Once | INTRAVENOUS | Status: AC
  Administered 2022-01-29: 500 mg via INTRAVENOUS
  Filled 2022-01-29: qty 5

## 2022-01-29 MED ORDER — ACETAMINOPHEN 160 MG/5ML PO SOLN: 650.0000 mg | ORAL | Status: DC | PRN

## 2022-01-29 MED ORDER — IOHEXOL 350 MG/ML SOLN
100.0000 mL | Freq: Once | INTRAVENOUS | Status: AC | PRN
Start: 2022-01-29 — End: 2022-01-29
  Administered 2022-01-29: 100 mL via INTRAVENOUS

## 2022-01-29 MED ORDER — DEXAMETHASONE SODIUM PHOSPHATE 10 MG/ML IJ SOLN
10.0000 mg | Freq: Once | INTRAMUSCULAR | Status: AC
  Administered 2022-01-29: 10 mg via INTRAVENOUS
  Filled 2022-01-29: qty 1

## 2022-01-29 MED ORDER — LORAZEPAM 2 MG/ML IJ SOLN
INTRAMUSCULAR | Status: AC
  Administered 2022-01-29: 1 mg
  Filled 2022-01-29: qty 1

## 2022-01-29 MED ORDER — SODIUM CHLORIDE 0.9 % IV BOLUS
1000.0000 mL | Freq: Once | INTRAVENOUS | Status: AC
  Administered 2022-01-29: 1000 mL via INTRAVENOUS

## 2022-01-29 MED ORDER — CYANOCOBALAMIN 1000 MCG/ML IJ SOLN
1000.0000 ug | Freq: Once | INTRAMUSCULAR | Status: AC
  Administered 2022-01-29: 1000 ug via INTRAMUSCULAR
  Filled 2022-01-29: qty 1

## 2022-01-29 MED ORDER — ACETAMINOPHEN 325 MG PO TABS
650.0000 mg | ORAL_TABLET | ORAL | Status: DC | PRN
  Administered 2022-01-30 – 2022-01-31 (×2): 650 mg via ORAL
  Filled 2022-01-29 (×2): qty 2

## 2022-01-29 MED ORDER — STROKE: EARLY STAGES OF RECOVERY BOOK: Freq: Once | Status: DC

## 2022-01-29 MED ORDER — KETOROLAC TROMETHAMINE 15 MG/ML IJ SOLN: 15.0000 mg | Freq: Once | INTRAMUSCULAR | Status: DC

## 2022-01-29 MED ORDER — SENNOSIDES-DOCUSATE SODIUM 8.6-50 MG PO TABS: 1.0000 | ORAL_TABLET | Freq: Every evening | ORAL | Status: DC | PRN

## 2022-01-29 MED ORDER — VENLAFAXINE HCL ER 75 MG PO CP24
75.0000 mg | ORAL_CAPSULE | Freq: Every day | ORAL | Status: DC
  Administered 2022-01-30 – 2022-01-31 (×2): 75 mg via ORAL
  Filled 2022-01-29 (×2): qty 1

## 2022-01-29 MED ORDER — DIPHENHYDRAMINE HCL 50 MG/ML IJ SOLN
25.0000 mg | Freq: Once | INTRAMUSCULAR | Status: AC
  Administered 2022-01-29: 25 mg via INTRAVENOUS
  Filled 2022-01-29: qty 1

## 2022-01-29 NOTE — ED Provider Notes (Signed)
Signout received on this 46 year old female for evaluation of thunderclap headache, altered mental status with generalized weakness and dysarthria.  Imaging has been done including CTA head and neck with without contrast, CT venogram which did not show any acute findings.  At the time of signout patient is awaiting consult to neurology. ?In brief patient had sudden onset headache at 4 AM.  She took ibuprofen fell asleep and husband at 8 AM had a hard time waking her up and noticed she was having difficulty forming words, had generalized weakness.  Patient has ongoing deficits with generalized weakness and somnolence. ?Physical Exam  ?BP 118/90   Pulse 85   Temp 98.8 ?F (37.1 ?C) (Oral)   Resp 19   SpO2 96%  ? ?Physical Exam ? ?Procedures  ?Procedures ? ?ED Course / MDM  ? ?Clinical Course as of 01/29/22 1642  ?Sat Jan 29, 2022  ?1206 Concern for acute intracranial abnormality, dural venous thrombosis, posterior circulation vertebral dissection, vertebral artery embolus versus other.  Possible atypical seizure activity although unlikely, patient does not show postictal state or true seizure activity on my evaluation.  CT head clear, however patient still with clear neurologic deficits although none focal, not on stroke appearing.  We will obtain CT angio head and neck, and CT venogram of the head.  Based on results we will consult with neuro regarding further work-up. [CP]  ?1546 B12, thiamine, MR Brain, MR C Spine, MR T Spine, assess reflexes -- Per Saliqdina. Will consult when he has the chance. [CP]  ?  ?Clinical Course User Index ?[CP] Prosperi, Christian H, PA-C  ? ?Medical Decision Making ?Amount and/or Complexity of Data Reviewed ?Labs: ordered. ?Radiology: ordered. ? ?Risk ?Prescription drug management. ? ? ?Case was discussed with neurology by sign out team.  They recommend MRI brain without contrast, cervical spine, thoracic spine.  Case discussed with hospitalist Dr. Rogers Blocker who will evaluate patient for  admission. ? ? ? ? ?  ?Evlyn Courier, PA-C ?01/29/22 1644 ? ?  ?Blanchie Dessert, MD ?01/30/22 0000 ? ?

## 2022-01-29 NOTE — ED Notes (Signed)
Patient transported to CT 

## 2022-01-29 NOTE — ED Notes (Signed)
Observed pts lip start to twitch and then pts left arm started twitching followed by upper body convulsing. Pt unresponsive and unable to follow commands. EDP Tyrone Nine called to the bedside. Pt responsive and able to follow EDPs commands. Pt given 1 mg of ativan  ?

## 2022-01-29 NOTE — H&P (Addendum)
History and Physical    Patient: Megan Roach WGN:[id number] DOB: Jan 14, 1976 DOA: 01/29/2022 DOS: the patient was seen and examined on 01/29/2022 PCP: Janeece Agee, NP  Patient coming from: Home - lives with her husband and 3 kids    Chief Complaint: thunderclap headache, weakness, abnormal speech   HPI: Megan Roach is a 46 y.o. female with medical history significant of asthma and peri menopause who presented to ED with complaints Around 4AM she had suddent onset of severe headache, worst of her life, that woke her up. Husband states the headache was all over and throbbing. She took some advil and sent back to sleep. 8AM he tried to wake her up and she wasn't waking her up. He made her sit up and she wasn't able to speak. Her legs and arms were also heavy and she couldn't really move them. She was able to move them with a lot of effort. She was awake. He called 911.   She was her normal self last night and he states they went shopping last night. She did tell him she was feeling overwhelmed and sad about her dad. They went to [place] about one month ago and [place] a week ago.   She has been feeling good. Denies any fever/chills, vision changes/headaches, chest pain or palpitations, shortness of breath or cough, abdominal pain, N/V/D, dysuria or leg swelling.   She has been very fatigued lately and has intermittent spells of dizziness. No loss of sensation or weakness. No vision changes.    No hx of smoking or regular alcohol use. Her father passed away suddenly about 3 months ago. She has been grieving and stressed.  No hx of tick bites or other insect bites. No family hx of neurological disease.    ER Course:  vitals: afebrile, bp: 144/87, HR: 76, RR: 20, oxygen: 100%RA Pertinent labs: moderate hgb, rbc: 6-10,  CT head: no acute finding CTA head/neck: no acute finding CT venogram: normal In ED: neurology consulted.    Review of Systems: As mentioned in the history of  present illness. All other systems reviewed and are negative. Past Medical History:  Diagnosis Date   Asthma    Past Surgical History:  Procedure Laterality Date   TOTAL VAGINAL HYSTERECTOMY     Social History:  reports that she has never smoked. She has never used smokeless tobacco. She reports current alcohol use. She reports that she does not use drugs.  Allergies  Allergen Reactions   Sulfa Antibiotics Swelling    Family History  Problem Relation Age of Onset   Bladder Cancer Father    Idiopathic pulmonary fibrosis Father    Cervical cancer Other     Prior to Admission medications   Medication Sig Start Date End Date Taking? Authorizing Provider  fluticasone (FLONASE) 50 MCG/ACT nasal spray Place 2 sprays into both nostrils daily. 12/03/21   Janeece Agee, NP  meclizine (ANTIVERT) 25 MG tablet Take 1 tablet (25 mg total) by mouth 3 (three) times daily as needed for dizziness. 11/24/21   Janeece Agee, NP  ondansetron (ZOFRAN-ODT) 4 MG disintegrating tablet Take 1 tablet (4 mg total) by mouth every 8 (eight) hours as needed for nausea or vomiting. 11/24/21   Janeece Agee, NP  pantoprazole (PROTONIX) 40 MG tablet Take 1 tablet (40 mg total) by mouth daily. 12/03/21   Janeece Agee, NP  venlafaxine XR (EFFEXOR-XR) 75 MG 24 hr capsule Take 1 capsule (75 mg total) by mouth daily with breakfast. 01/06/22   Kateri Plummer,  Richard, NP    Physical Exam: Vitals:   01/29/22 1330 01/29/22 1400 01/29/22 1600 01/29/22 1700  BP: 123/77 118/90 123/77 122/80  Pulse: 85 85 82 81  Resp: 17 19 20 20   Temp:      TempSrc:      SpO2: 98% 96% 97% 97%   General:  Appears calm and comfortable and is in NAD. Sleeping, but easily arouses.  Eyes:  PERRL, EOMI, normal lids, iris ENT:  grossly normal hearing, lips & tongue, mmm; appropriate dentition Neck:  no LAD, masses or thyromegaly; no carotid bruits Cardiovascular:  RRR, no m/r/g. No LE edema.  Respiratory:   CTA bilaterally with no  wheezes/rales/rhonchi.  Normal respiratory effort. Abdomen:  soft, NT, ND, NABS Back:   normal alignment, no CVAT Skin:  no rash or induration seen on limited exam Musculoskeletal:  globally weak BUE/BLE. Can not lift legs off of bed bilaterally. Bilateral arms she can raise, but no resistance. Hand grip 2/5. Poor ROM. no bony abnormality Lower extremity:  No LE edema.  Limited foot exam with no ulcerations.  2+ distal pulses. Psychiatric:  sleepy, speech dysarthric, but appropriate, AOx3 Neurologic:  CN 2-6, limited. Can not keep her eyes open to do full exam. No nystagmus. CN 7: dull sensation over chin and right lower side of cheek. CN 11: poor movement of shoulders.   Radiological Exams on Admission: Independently reviewed - see discussion in A/P where applicable  CT ANGIO HEAD NECK W WO CM  Result Date: 01/29/2022 CLINICAL DATA:  Neuro deficit, acute stroke suspected. Severe headache. Slurred speech EXAM: CT ANGIOGRAPHY HEAD AND NECK TECHNIQUE: Multidetector CT imaging of the head and neck was performed using the standard protocol during bolus administration of intravenous contrast. Multiplanar CT image reconstructions and MIPs were obtained to evaluate the vascular anatomy. Carotid stenosis measurements (when applicable) are obtained utilizing NASCET criteria, using the distal internal carotid diameter as the denominator. RADIATION DOSE REDUCTION: This exam was performed according to the departmental dose-optimization program which includes automated exposure control, adjustment of the mA and/or kV according to patient size and/or use of iterative reconstruction technique. CONTRAST:  OMNIPAQUE IOHEXOL 350 MG/ML SOLN COMPARISON:  CT head without contrast 01/29/2022 FINDINGS: CTA NECK FINDINGS Aortic arch: A 3 vessel arch configuration is present. No significant vascular calcifications are present. No stenosis or aneurysm present. Right carotid system: Right common carotid artery is within  normal limits. Bifurcation is unremarkable. Mild tortuosity is present cervical right ICA without significant stenosis. Left carotid system: The left common carotid artery is within normal limits. Bifurcation is unremarkable. Mild tortuosity is present cervical left ICA without significant stenosis. Vertebral arteries: The vertebral arteries are codominant. Both vertebral arteries originate from the subclavians without significant stenosis. No significant stenosis or vascular injury is present to either vertebral artery in the neck. Skeleton: Vertebral body heights and alignment are normal. No focal osseous lesions are present. Other neck: Soft tissues the neck are otherwise unremarkable. Salivary glands are within normal limits. Thyroid is normal. No significant adenopathy is present. No focal mucosal or submucosal lesions are present. Upper chest: Lung apices demonstrate minimal dependent atelectasis. No airspace disease or nodule is present. Thoracic inlet is within normal limits. Review of the MIP images confirms the above findings CTA HEAD FINDINGS Anterior circulation: The internal carotid arteries are within normal limits from the skull base through the ICA termini bilaterally. The A1 and M1 segments are normal. The anterior communicating artery is patent. MCA bifurcations are within  normal limits. ACA and MCA branch vessels are unremarkable. Posterior circulation: The vertebral arteries are codominant. PICA origins are visualized and normal. Vertebrobasilar junction is normal. The basilar artery is within normal limits. Both posterior cerebral arteries originate from the basilar tip. The PCA branch vessels are within normal limits bilaterally. Venous sinuses: The dural sinuses are patent. The straight sinus deep cerebral veins are intact. Cortical veins are within normal limits. No significant vascular malformation is evident. Anatomic variants: None Review of the MIP images confirms the above findings  IMPRESSION: 1. Mild tortuosity of the cervical internal carotid arteries bilaterally without significant stenosis. This is nonspecific, but most likely related to hypertension. 2. Otherwise normal CTA of the neck. 3. Normal CTA Circle of Willis without significant proximal stenosis, aneurysm, or branch vessel occlusion. Electronically Signed   By: Marin Roberts M.D.   On: 01/29/2022 15:16   CT HEAD WO CONTRAST  Result Date: 01/29/2022 CLINICAL DATA:  Neuro deficit, acute stroke suspected EXAM: CT HEAD WITHOUT CONTRAST TECHNIQUE: Contiguous axial images were obtained from the base of the skull through the vertex without intravenous contrast. RADIATION DOSE REDUCTION: This exam was performed according to the departmental dose-optimization program which includes automated exposure control, adjustment of the mA and/or kV according to patient size and/or use of iterative reconstruction technique. COMPARISON:  None. FINDINGS: Brain: No evidence of acute infarction, hemorrhage, hydrocephalus, extra-axial collection or mass lesion/mass effect. Vascular: No hyperdense vessel or unexpected calcification. Skull: Normal. Negative for fracture or focal lesion. Sinuses/Orbits: No acute finding. Other: None. IMPRESSION: No acute intracranial pathology. No noncontrast CT evidence of acute stroke or hemorrhage. Electronically Signed   By: Jearld Lesch M.D.   On: 01/29/2022 11:31   CT VENOGRAM HEAD  Result Date: 01/29/2022 CLINICAL DATA:  Severe headache. Slurred speech. Question dural sinus thrombosis. EXAM: CT VENOGRAM HEAD TECHNIQUE: Venographic phase images of the brain were obtained following the administration of intravenous contrast. Multiplanar reformats and maximum intensity projections were generated. RADIATION DOSE REDUCTION: This exam was performed according to the departmental dose-optimization program which includes automated exposure control, adjustment of the mA and/or kV according to patient size  and/or use of iterative reconstruction technique. CONTRAST:  OMNIPAQUE IOHEXOL 350 MG/ML SOLN COMPARISON:  None. FINDINGS: Delayed images demonstrate patent dural sinuses bilaterally. Bilateral occipital veins noted. No significant focal stenosis or thrombus. IMPRESSION: Normal CT venogram of the head. Electronically Signed   By: Marin Roberts M.D.   On: 01/29/2022 15:22    EKG: Independently reviewed.  NSR with rate 75; nonspecific ST changes with no evidence of acute ischemia No previous ekg.   Labs on Admission: I have personally reviewed the available labs and imaging studies at the time of the admission.  Pertinent labs:   UA: moderate hgb. 6-10 RBC   Assessment and Plan: Principal Problem:   Thunderclap headache with stroke like symptoms  Active Problems:   Stroke-like symptoms   Peri-menopause   Microscopic hematuria    Assessment and Plan: * Thunderclap headache with stroke like symptoms  46 year old female presenting with acute onset of thunderclap headache with dysarthric speech, global weakness -obs to tele -neurology consulted-f/u on recs  -MRI brain, cervical and thoracic spine pending -folate/B12 pending -UDS wnl, mag wnl. TSH wnl one month ago  -other differentials include MS (hx of fatigue, dizziness), complex migraine, conversion. Neuro exam inconsistent at times. Denies history of tick bites. grieving sudden loss of her father  -routine neuro checks -failed swallow screen, but had  good laryngeal elevation and normal swallow when I was in room. Asked nurse to do again as she is asking for food -PT/OT/ST eval   Microscopic hematuria Repeat outpatient and refer to urology if needed   Peri-menopause Continue effexor. Had dose increased to 75mg  about 2+weeks ago, can cause  Headaches. Will continue as do not want withdrawal symptoms, consider dose decrease if work up negative.     Advance Care Planning:   Code Status: Full Code   Consults:  neurology: Dr. Derry Lory   DVT Prophylaxis: SCDs   Family Communication: husband at bedside: Louis Matte  Severity of Illness: The appropriate patient status for this patient is OBSERVATION. Observation status is judged to be reasonable and necessary in order to provide the required intensity of service to ensure the patient's safety. The patient's presenting symptoms, physical exam findings, and initial radiographic and laboratory data in the context of their medical condition is felt to place them at decreased risk for further clinical deterioration. Furthermore, it is anticipated that the patient will be medically stable for discharge from the hospital within 2 midnights of admission.   Author: Orland Mustard, MD 01/29/2022 7:50 PM  For on call review [email or web address].

## 2022-01-29 NOTE — ED Notes (Signed)
Report given to Larena Glassman, RN (3W). ?

## 2022-01-29 NOTE — ED Notes (Signed)
Pt sleeping until I awakened her for the nih  when I asked her yo speak the husband at the bedside reports that she cannot speak   I asked her to say mama and she made a partial sound then made a noise.  No effort to follow commands.  No movement in her legs  ?

## 2022-01-29 NOTE — Consult Note (Addendum)
Neurology Consultation ?Reason for Consult: Headache with neurological symptoms ?Referring Physician: Rogers Blocker, A ? ?CC: stuttering ? ?History is obtained from: Patient, husband. ? ?HPI: Megan Roach is a 46 y.o. female who woke up around 4 AM with a bifrontal severe headache.  At 8 AM, she was somnolent and hard to arouse.  Her husband states that she was having difficulty with speaking and was not very responsive.  She was brought into the emergency department where head CT was normal.  She had a CT/CTA/CTV all of which were normal.  Of note she has a history of B12 deficiency, though she does take supplementation. ? ? ?LKW: Prior to bed 4/14 ?tpa given?: no, outside of window ? ? ?ROS: A 14 point ROS was performed and is negative except as noted in the HPI.  ? ?Past Medical History:  ?Diagnosis Date  ? Asthma   ? ? ? ?Family History  ?Problem Relation Age of Onset  ? Bladder Cancer Father   ? Idiopathic pulmonary fibrosis Father   ? Cervical cancer Other   ? ? ? ?Social History:  reports that she has never smoked. She has never used smokeless tobacco. She reports current alcohol use. She reports that she does not use drugs. ? ? ?Exam: ?Current vital signs: ?BP 126/89 (BP Location: Left Arm)   Pulse 100   Temp 98.4 ?F (36.9 ?C) (Oral)   Resp 18   SpO2 100%  ?Vital signs in last 24 hours: ?Temp:  [98.4 ?F (36.9 ?C)-98.8 ?F (37.1 ?C)] 98.4 ?F (36.9 ?C) (04/15 2118) ?Pulse Rate:  [70-100] 100 (04/15 2118) ?Resp:  [15-24] 18 (04/15 2023) ?BP: (118-144)/(77-90) 126/89 (04/15 2118) ?SpO2:  [96 %-100 %] 100 % (04/15 2118) ? ? ?Physical Exam  ?Constitutional: Appears well-developed and well-nourished.  ?Psych: Affect appropriate to situation ?Eyes: No scleral injection ?HENT: No OP obstruction ?MSK: no joint deformities.  ?Cardiovascular: Normal rate and regular rhythm.  ?Respiratory: Effort normal, non-labored breathing ?GI: Soft.  No distension. There is no tenderness.  ?Skin: WDI ? ?Neuro: ?Mental  Status: ?Patient is awake, alert, oriented to person, place, month, year, and situation. ?Patient is able to give a clear and coherent history. ?No signs of aphasia or neglect.  She has stuttering that appears nonphysiologic in origin. ?Cranial Nerves: ?II: Visual Fields are full. Pupils are equal, round, and reactive to light.   ?III,IV, VI: EOMI without ptosis or diploplia.  ?V: Facial sensation is symmetric to temperature ?VII: Facial movement is symmetric.  ?VIII: hearing is intact to voice ?X: Uvula elevates symmetrically ?XI: Shoulder shrug is symmetric. ?XII: tongue is midline without atrophy or fasciculations.  ?Motor: ?Tone is normal. Bulk is normal. 5/5 strength was present in all four extremities.  ?Sensory: ?Sensation is diminished in the right leg. ?Cerebellar: ?FNF and HKS are intact bilaterally ? ? ? ?I have reviewed labs in epic and the results pertinent to this consultation are: ?Cmp - unremarkable ? ?I have reviewed the images obtained: ct/cta/ctv - negative ? ?Impression: 46 year old female with acute onset headache associated with photophobia and decreased responsiveness.  Her stuttering appears nonphysiological, and I suspect there is some psychogenic overlay, but I suspect that she suffered from a migraine this morning which is consistent with most of her symptoms.  With improvement in her headache, I encouraged her to continue to improve.  To rule out other pathology, an MRI has been ordered but if this is negative then no further testing is needed.  I discussed this with the  patient and her husband. ? ?Recommendations: ?1) MRI brain, cervical spine, thoracic spine ?2) if these are negative, then neurology will be available on an as-needed basis. ? ?Roland Rack, MD ?[facility] ?[phone] ? ?If 7pm- 7am, please page neurology on call as listed in [place]. ? ?

## 2022-01-29 NOTE — Assessment & Plan Note (Addendum)
46 year old female presenting with acute onset of thunderclap headache with dysarthric speech, global weakness ?-obs to tele ?-neurology consulted-f/u on recs  ?-MRI brain, cervical and thoracic spine pending ?-folate/B12 pending ?-UDS wnl, mag wnl. TSH wnl one month ago  ?-other differentials include MS (hx of fatigue, dizziness), complex migraine, conversion. Neuro exam inconsistent at times. Denies history of tick bites. grieving sudden loss of her father  ?-routine neuro checks ?-failed swallow screen, but had good laryngeal elevation and normal swallow when I was in room. Asked nurse to do again as she is asking for food ?-PT/OT/ST eval  ?

## 2022-01-29 NOTE — ED Triage Notes (Signed)
Pt coming from home via [facility]. EMS called for stroke like symptoms. Pt woke up at 4 am this morning with a severe headache. Pts husband states her speech was normal at that time. Speech is now difficult to understand. PT slide over to stretcher without assistance. EMS reports pt moving all extremities. Pt states all extremities feel heavy. No PMH. 20 g in right and left AC ? ?BP 150/90 ?99% RA ?CBG 121  ? ? ?

## 2022-01-29 NOTE — ED Notes (Signed)
Mri called to take the pt there  dr Rogers Blocker reported not to take her yet  she wants to examine her first  delayed for now ?

## 2022-01-29 NOTE — ED Provider Notes (Signed)
[name] ?Provider Note ? ? ?CSN: [id number] ?Arrival date & time: 01/29/22  0934 ? ?  ? ?History ? ?Chief Complaint  ?Patient presents with  ? Altered Mental Status  ? ? ?Megan Roach is a 46 y.o. female with no seeming past medical history who presents with concern for severe headache 10 out of 10 at 4 AM this morning that woke her up from sleep.  Patient husband reports that her speech was normal at the time.  Patient then went back to sleep, husband had to wake her up at 8 AM.  Patient was difficult to arouse at this time, reports that she had difficulty with her speech, weakness, is endorsing some pain with eye movements, sensitivity to light, difficulty moving all extremities and feeling that all extremities are heavy/weak.  Patient denies numbness, vision changes.  Patient with no history of alcohol abuse, previous MS, she did have a mildly decreased B12 on previous neurologic evaluation, but has been taking B12 supplementation since then.  No history of vegetarianism or veganism. ? ? ?Altered Mental Status ?Associated symptoms: headaches and weakness   ? ?  ? ?Home Medications ?Prior to Admission medications   ?Medication Sig Start Date End Date Taking? Authorizing Provider  ?fluticasone (FLONASE) 50 MCG/ACT nasal spray Place 2 sprays into both nostrils daily. 12/03/21   Maximiano Coss, NP  ?meclizine (ANTIVERT) 25 MG tablet Take 1 tablet (25 mg total) by mouth 3 (three) times daily as needed for dizziness. 11/24/21   Maximiano Coss, NP  ?ondansetron (ZOFRAN-ODT) 4 MG disintegrating tablet Take 1 tablet (4 mg total) by mouth every 8 (eight) hours as needed for nausea or vomiting. 11/24/21   Maximiano Coss, NP  ?pantoprazole (PROTONIX) 40 MG tablet Take 1 tablet (40 mg total) by mouth daily. 12/03/21   Maximiano Coss, NP  ?venlafaxine XR (EFFEXOR-XR) 75 MG 24 hr capsule Take 1 capsule (75 mg total) by mouth daily with breakfast. 01/06/22   Maximiano Coss, NP  ?    ? ?Allergies    ?Sulfa antibiotics   ? ?Review of Systems   ?Review of Systems  ?Constitutional:   ?     AMS  ?Neurological:  Positive for speech difficulty, weakness, numbness and headaches.  ?All other systems reviewed and are negative. ? ?Physical Exam ?Updated Vital Signs ?BP 118/90   Pulse 85   Temp 98.8 ?F (37.1 ?C) (Oral)   Resp 19   SpO2 96%  ?Physical Exam ?Vitals and nursing note reviewed.  ?Constitutional:   ?   General: She is not in acute distress. ?   Appearance: Normal appearance.  ?HENT:  ?   Head: Normocephalic and atraumatic.  ?Eyes:  ?   General:     ?   Right eye: No discharge.     ?   Left eye: No discharge.  ?Cardiovascular:  ?   Rate and Rhythm: Normal rate and regular rhythm.  ?   Heart sounds: No murmur heard. ?  No friction rub. No gallop.  ?Pulmonary:  ?   Effort: Pulmonary effort is normal.  ?   Breath sounds: Normal breath sounds.  ?Abdominal:  ?   General: Bowel sounds are normal.  ?   Palpations: Abdomen is soft.  ?Skin: ?   General: Skin is warm and dry.  ?   Capillary Refill: Capillary refill takes less than 2 seconds.  ?Neurological:  ?   Mental Status: She is alert.  ?   Comments: Patient with  clear dysarthria versus expressive aphasia, has difficulty speaking appropriately.  She has some strength deficits of bilateral upper and lower extremities, lower extremities greater than upper extremities, however they do not seem to have any unilateral weakness 1 limb greater than the other.  She has some abnormal coordination with ataxic movements of the arms however she is able to perform finger-nose.  Unable to perform heel-to-shin secondary to leg weakness.  Unable to perform Romberg or gait secondary to leg weakness.  Patient has equal round reactive pupils, otherwise intact cranial nerves.  She appears to have some spasmodic activity of the jaw, feeling of tightness in the lower face.  No appreciable facial droop.  ?Psychiatric:     ?   Mood and Affect: Mood normal.     ?    Behavior: Behavior normal.  ? ? ?ED Results / Procedures / Treatments   ?Labs ?(all labs ordered are listed, but only abnormal results are displayed) ?Labs Reviewed  ?APTT - Abnormal; Notable for the following components:  ?    Result Value  ? aPTT 40 (*)   ? All other components within normal limits  ?COMPREHENSIVE METABOLIC PANEL - Abnormal; Notable for the following components:  ? Glucose, Bld 107 (*)   ? All other components within normal limits  ?URINALYSIS, ROUTINE W REFLEX MICROSCOPIC - Abnormal; Notable for the following components:  ? Hgb urine dipstick MODERATE (*)   ? Bacteria, UA RARE (*)   ? All other components within normal limits  ?CBG MONITORING, ED - Abnormal; Notable for the following components:  ? Glucose-Capillary 115 (*)   ? All other components within normal limits  ?I-STAT CHEM 8, ED - Abnormal; Notable for the following components:  ? Glucose, Bld 104 (*)   ? All other components within normal limits  ?RESP PANEL BY RT-PCR (FLU A&B, COVID) ARPGX2  ?ETHANOL  ?PROTIME-INR  ?CBC  ?DIFFERENTIAL  ?RAPID URINE DRUG SCREEN, HOSP PERFORMED  ?MAGNESIUM  ?VITAMIN B12  ?FOLATE  ?I-STAT BETA HCG BLOOD, ED ([facility], [facility], [facility] ONLY)  ? ? ?EKG ?EKG Interpretation ? ?Date/Time:  Saturday January 29 2022 09:39:18 EDT ?Ventricular Rate:  75 ?PR Interval:  153 ?QRS Duration: 103 ?QT Interval:  388 ?QTC Calculation: 434 ?R Axis:   53 ?Text Interpretation: Sinus rhythm no wpw, prolonged qt or brugada No old tracing to compare Confirmed by Deno Etienne [phone]) on 01/29/2022 10:43:31 AM ? ?Radiology ?CT ANGIO HEAD NECK W WO CM ? ?Result Date: 01/29/2022 ?CLINICAL DATA:  Neuro deficit, acute stroke suspected. Severe headache. Slurred speech EXAM: CT ANGIOGRAPHY HEAD AND NECK TECHNIQUE: Multidetector CT imaging of the head and neck was performed using the standard protocol during bolus administration of intravenous contrast. Multiplanar CT image reconstructions and MIPs were obtained to evaluate the vascular anatomy. Carotid  stenosis measurements (when applicable) are obtained utilizing NASCET criteria, using the distal internal carotid diameter as the denominator. RADIATION DOSE REDUCTION: This exam was performed according to the departmental dose-optimization program which includes automated exposure control, adjustment of the mA and/or kV according to patient size and/or use of iterative reconstruction technique. CONTRAST:  130m OMNIPAQUE IOHEXOL 350 MG/ML SOLN COMPARISON:  CT head without contrast 01/29/2022 FINDINGS: CTA NECK FINDINGS Aortic arch: A 3 vessel arch configuration is present. No significant vascular calcifications are present. No stenosis or aneurysm present. Right carotid system: Right common carotid artery is within normal limits. Bifurcation is unremarkable. Mild tortuosity is present cervical right ICA without significant stenosis. Left carotid system: The left  common carotid artery is within normal limits. Bifurcation is unremarkable. Mild tortuosity is present cervical left ICA without significant stenosis. Vertebral arteries: The vertebral arteries are codominant. Both vertebral arteries originate from the subclavians without significant stenosis. No significant stenosis or vascular injury is present to either vertebral artery in the neck. Skeleton: Vertebral body heights and alignment are normal. No focal osseous lesions are present. Other neck: Soft tissues the neck are otherwise unremarkable. Salivary glands are within normal limits. Thyroid is normal. No significant adenopathy is present. No focal mucosal or submucosal lesions are present. Upper chest: Lung apices demonstrate minimal dependent atelectasis. No airspace disease or nodule is present. Thoracic inlet is within normal limits. Review of the MIP images confirms the above findings CTA HEAD FINDINGS Anterior circulation: The internal carotid arteries are within normal limits from the skull base through the ICA termini bilaterally. The A1 and M1  segments are normal. The anterior communicating artery is patent. MCA bifurcations are within normal limits. ACA and MCA branch vessels are unremarkable. Posterior circulation: The vertebral arteries are codominant. PICA

## 2022-01-29 NOTE — ED Notes (Signed)
The pt is snoring sleeping  the husband at bedside  reports that she has been sleeping since she received medicine earlier ?

## 2022-01-29 NOTE — Assessment & Plan Note (Signed)
Repeat outpatient and refer to urology if needed  ?

## 2022-01-29 NOTE — Assessment & Plan Note (Addendum)
Continue effexor. Had dose increased to 48m about 2+weeks ago, can cause  Headaches. Will continue as do not want withdrawal symptoms, consider dose decrease if work up negative.  ?

## 2022-01-30 ENCOUNTER — Observation Stay (HOSPITAL_COMMUNITY): Payer: BC Managed Care – PPO

## 2022-01-30 DIAGNOSIS — F4321 Adjustment disorder with depressed mood: Secondary | ICD-10-CM | POA: Diagnosis not present

## 2022-01-30 DIAGNOSIS — G4453 Primary thunderclap headache: Secondary | ICD-10-CM | POA: Diagnosis not present

## 2022-01-30 DIAGNOSIS — Z634 Disappearance and death of family member: Secondary | ICD-10-CM | POA: Diagnosis present

## 2022-01-30 LAB — HIV ANTIBODY (ROUTINE TESTING W REFLEX): HIV Screen 4th Generation wRfx: NONREACTIVE

## 2022-01-30 LAB — LIPID PANEL
Cholesterol: 195 mg/dL (ref 0–200)
HDL: 64 mg/dL (ref >40)
LDL Cholesterol: 120 mg/dL — ABNORMAL HIGH (ref 0–99)
Total CHOL/HDL Ratio: 3 RATIO
Triglycerides: 54 mg/dL (ref <150)
VLDL: 11 mg/dL (ref 0–40)

## 2022-01-30 LAB — VITAMIN D 25 HYDROXY (VIT D DEFICIENCY, FRACTURES): Vit D, 25-Hydroxy: 35.78 ng/mL (ref 30–100)

## 2022-01-30 IMAGING — MR MR CERVICAL SPINE W/ CM
2 series · 19 of 46 positions shown · IV contrast (7ML GAD)
Comparison: Noncontrast cervical MRI [DATE].

CLINICAL DATA: 45-year-old female with headache and neurologic
deficit. Abnormal patchy and indistinct spinal cord signal at C7-T1
on noncontrast exam yesterday, nonspecific but suspicious for
demyelination.

EXAM:
MRI CERVICAL SPINE WITH CONTRAST
TECHNIQUE: Multiplanar, multisequence MR imaging of the cervical spine was
performed following the administration of intravenous contrast.

[Series 2: T1 fat-sat post-contrast · sagittal · 3.0mm · 0.35mm/px · 10 of 15 slices shown]
[im 1/15]
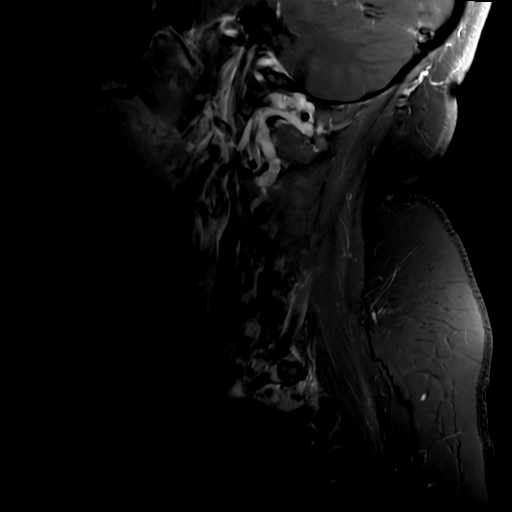
[im 2/15]
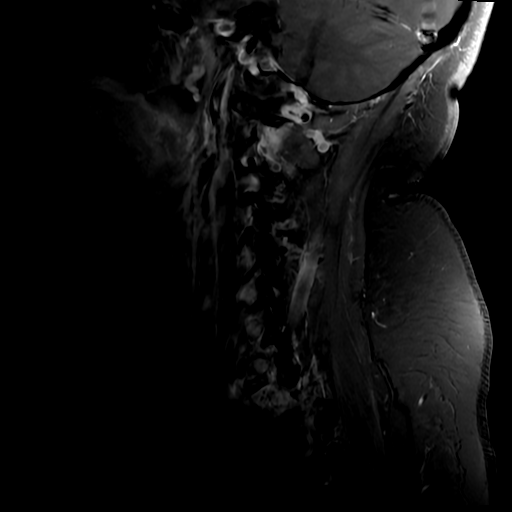
[im 3/15]
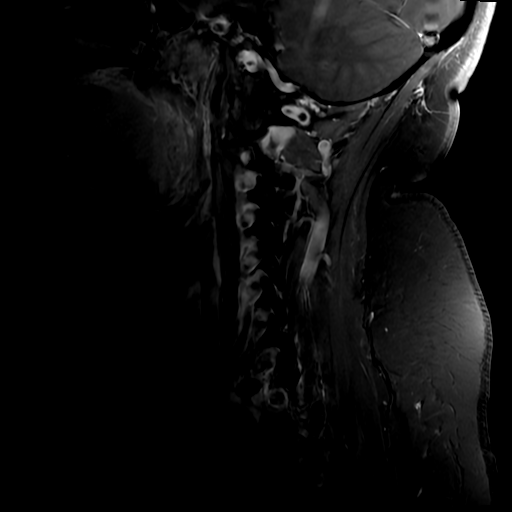
[im 5/15]
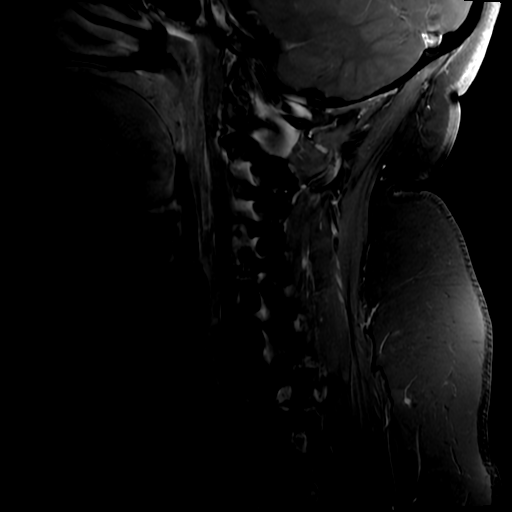
[im 7/15]
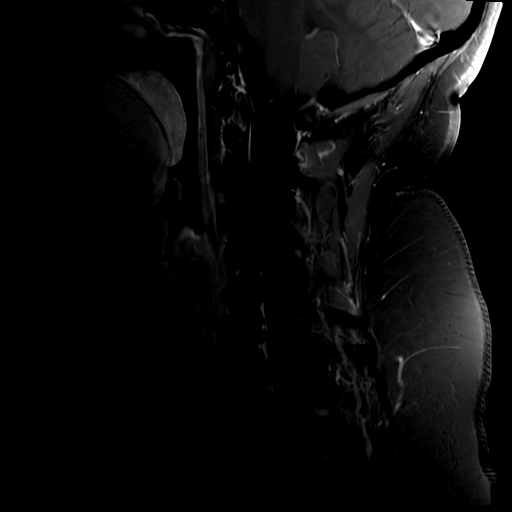
[im 8/15]
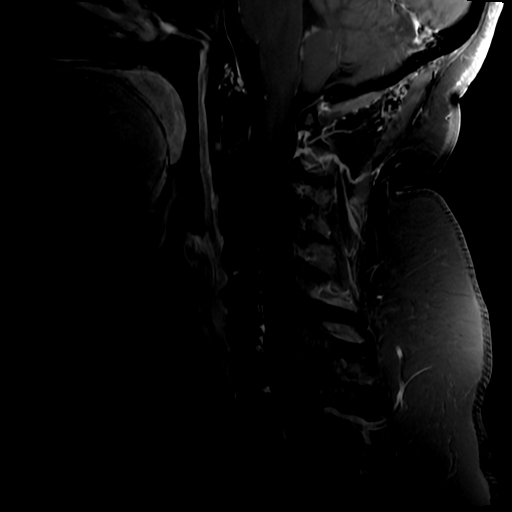
[im 9/15]
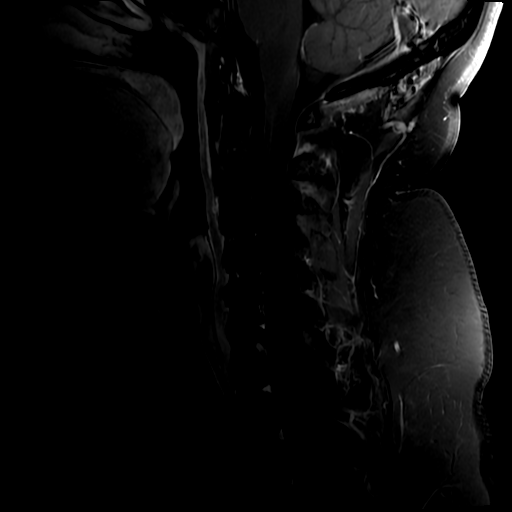
[im 11/15]
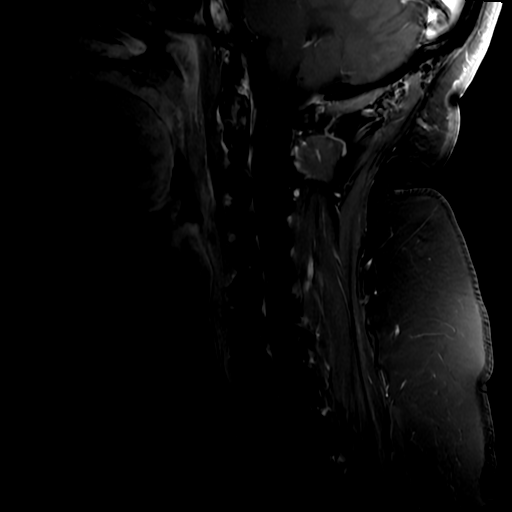
[im 13/15]
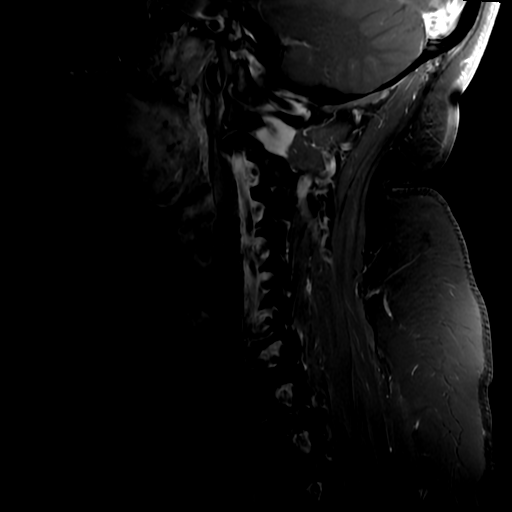
[im 15/15]
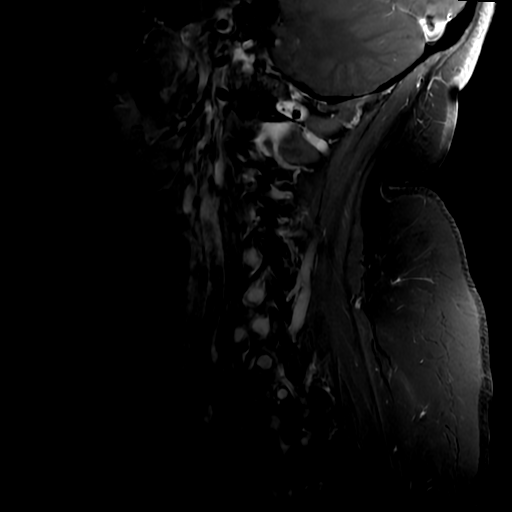

[Series 3: T1 post-contrast · axial · 3.0mm · 0.35mm/px · z∈[-78,+16]mm · 9 of 31 slices shown]
[im 2/31]
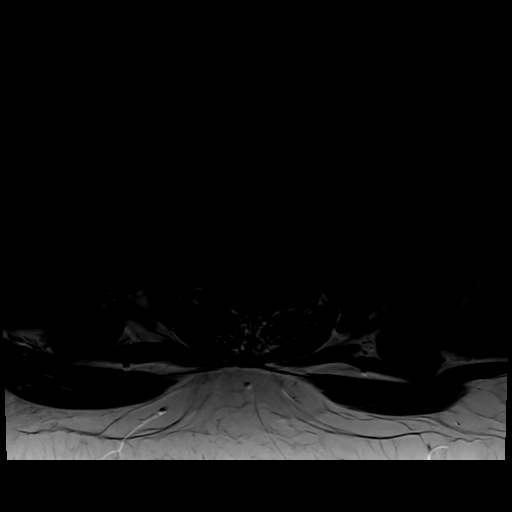
[im 6/31]
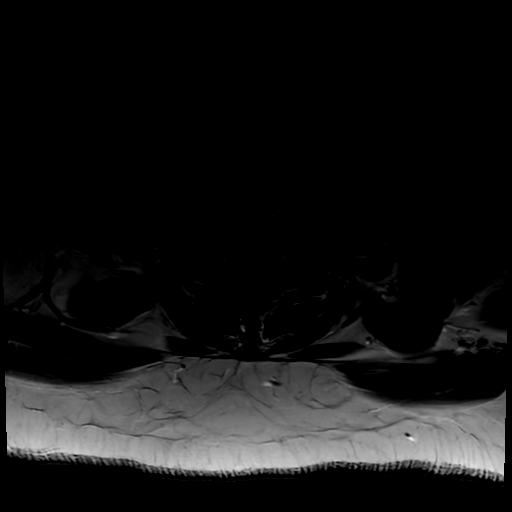
[im 10/31]
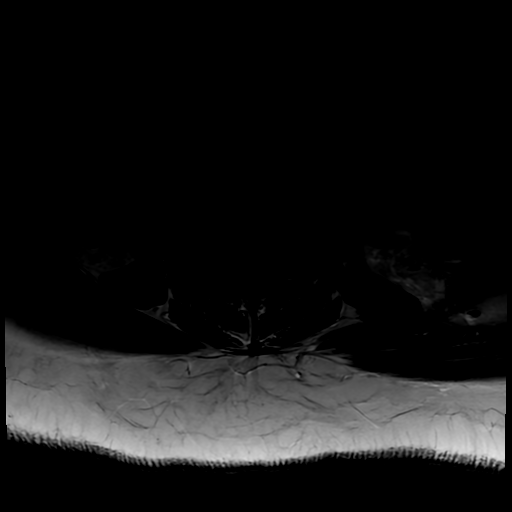
[im 14/31]
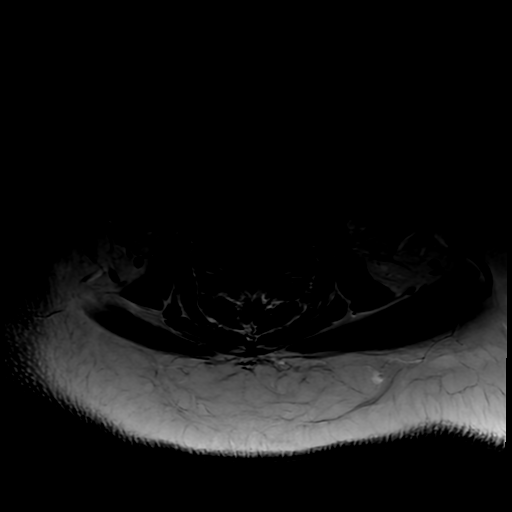
[im 16/31]
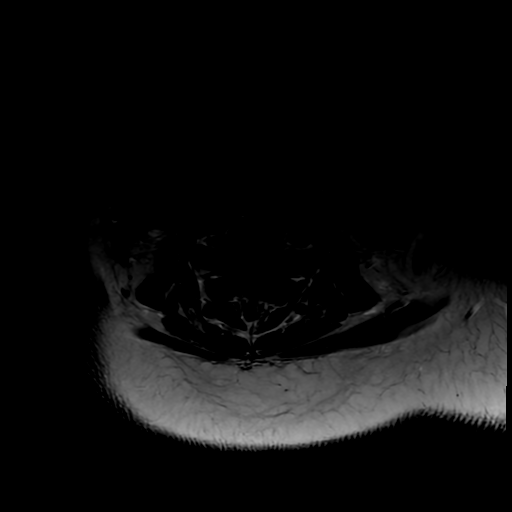
[im 18/31]
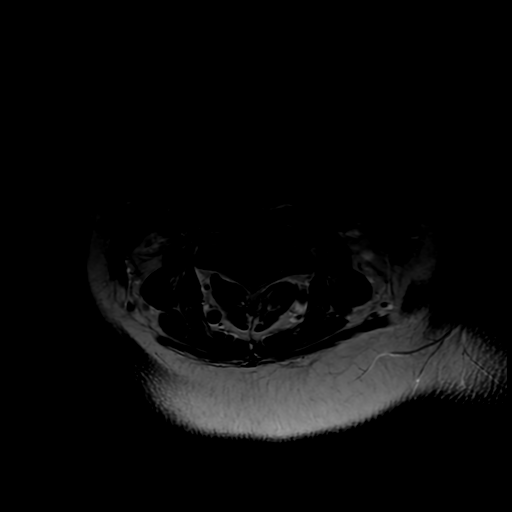
[im 22/31]
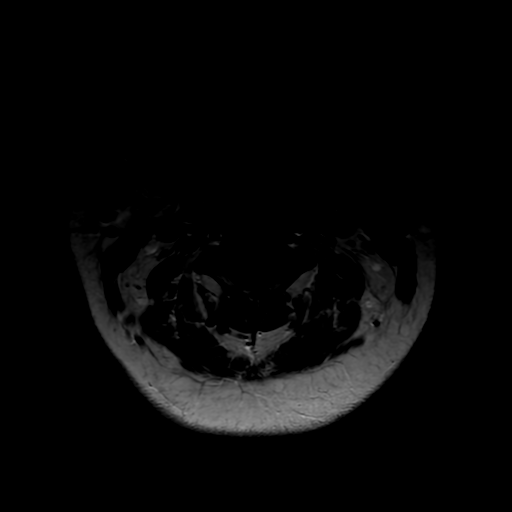
[im 26/31]
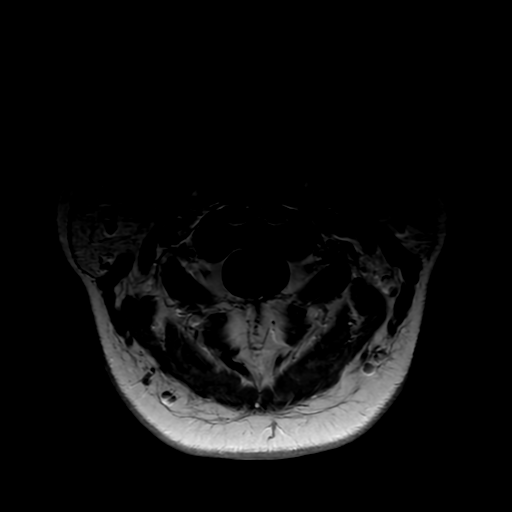
[im 30/31]
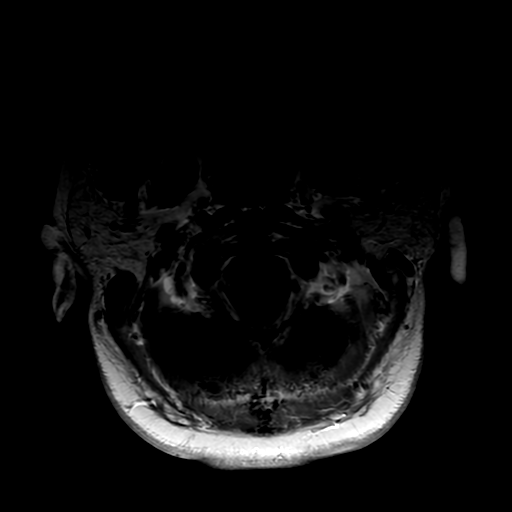

[19 of 46 positions shown; findings below may reference images not displayed]

FINDINGS: Alignment: Stable straightening of lordosis.

Vertebrae: No abnormal marrow enhancement.

Cord: No definite cord expansion corresponding to the C7-T1 T2 and
STIR hyperintense lesion, and no abnormal cord enhancement there or
elsewhere. No dural thickening or enhancement.

Posterior Fossa, vertebral arteries, paraspinal tissues: No abnormal
enhancement. Major vascular flow voids in the neck are preserved.

Disc levels:

Unchanged.
IMPRESSION: Negative, no abnormal spinal cord enhancement at the C7-T1 cord
lesion or elsewhere.

## 2022-01-30 IMAGING — MR MR THORACIC SPINE W/ CM
3 series · 19 of 48 positions shown · IV contrast (7ML GADAVIST)
Comparison: Cervical and thoracic noncontrast MRI yesterday.

CLINICAL DATA: 45-year-old female with headache and neurologic
deficit. Abnormal patchy and indistinct spinal cord signal at C7-T1
on noncontrast exam yesterday and also at T2-T3, nonspecific but
suspicious for
demyelination.

EXAM:
MRI THORACIC SPINE WITH CONTRAST
TECHNIQUE: Multiplanar, multisequence MR imaging of the thoracic spine was
performed following the administration of intravenous contrast.
CONTRAST:  7mL GADAVIST GADOBUTROL 1 MMOL/ML IV SOLN in conjunction
with contrast enhanced imaging of the cervical spine today reported
separately.

[Series 2: T1 · sagittal · 3.0mm · 0.90mm/px · 6 of 12 slices shown]
[im 1/12]
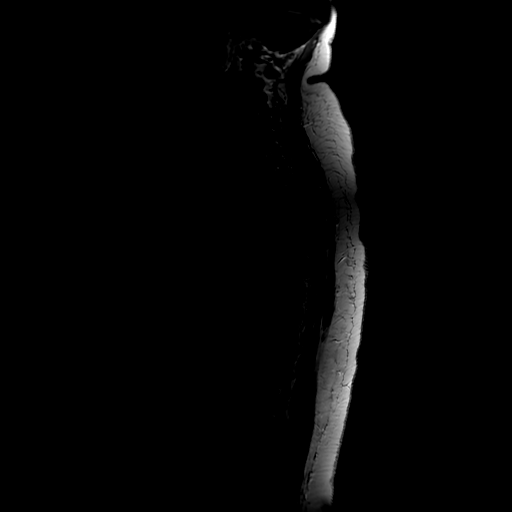
[im 3/12]
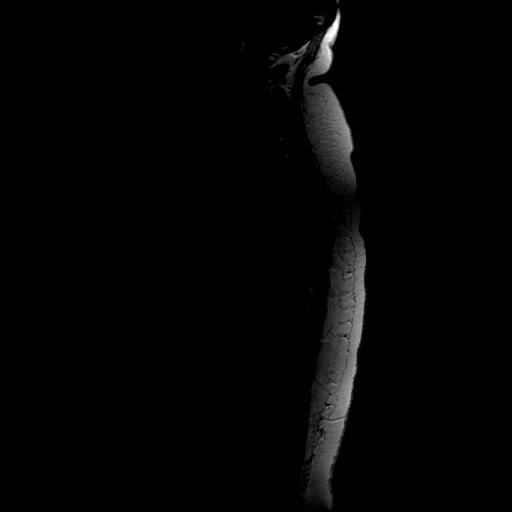
[im 5/12]
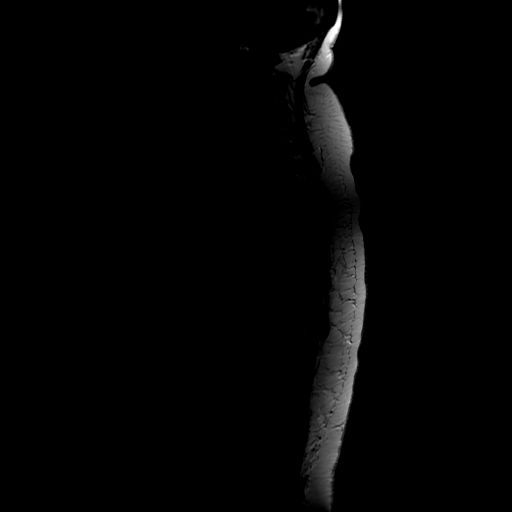
[im 7/12]
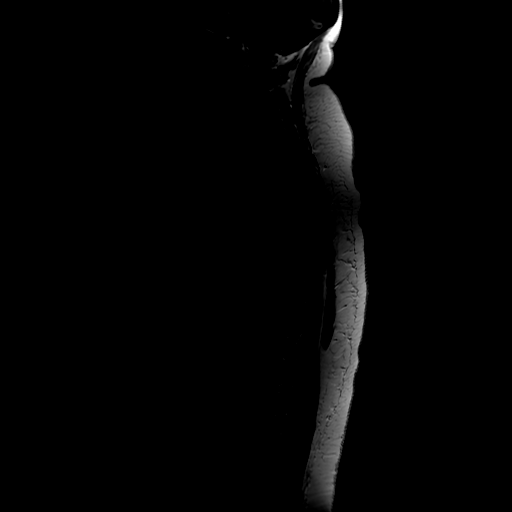
[im 9/12]
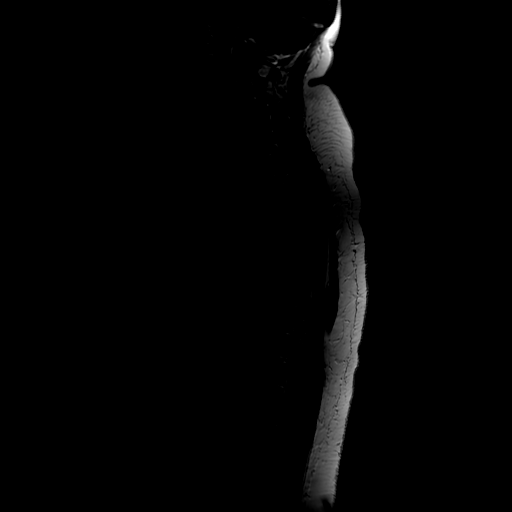
[im 12/12]
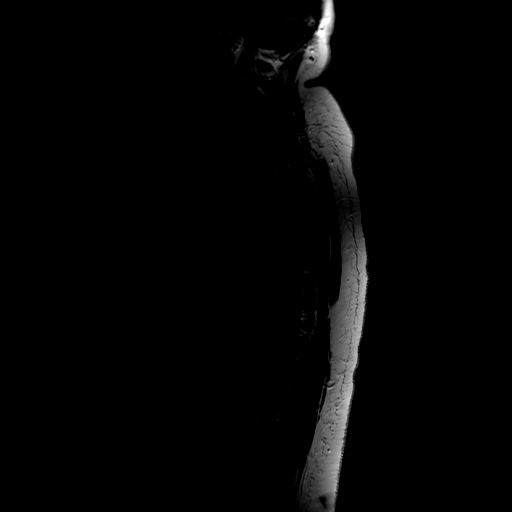

[Series 3: T1 fat-sat post-contrast · sagittal · 3.0mm · 0.51mm/px · 8 of 17 slices shown]
[im 1/17]
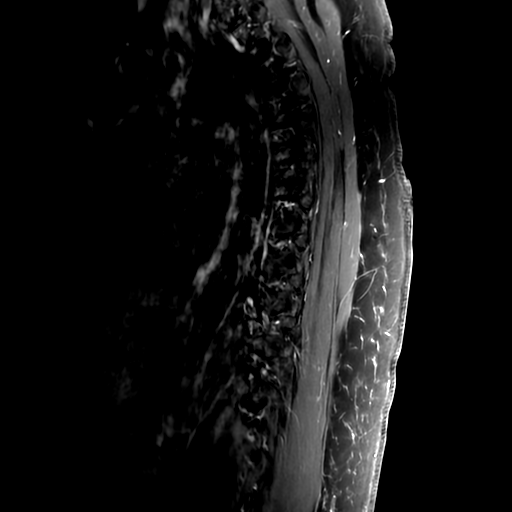
[im 3/17]
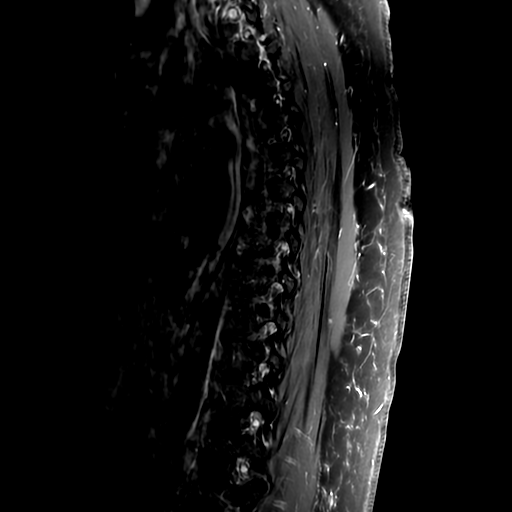
[im 5/17]
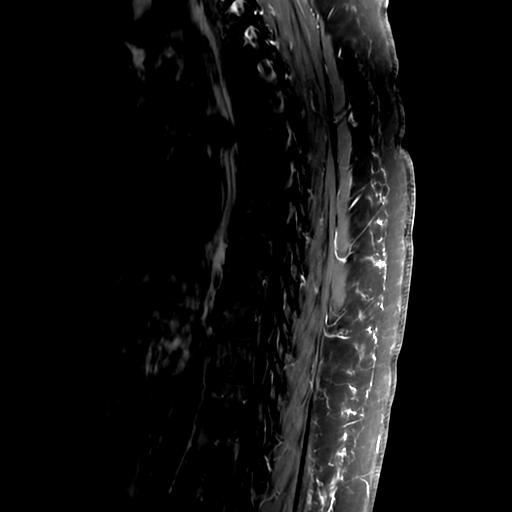
[im 7/17]
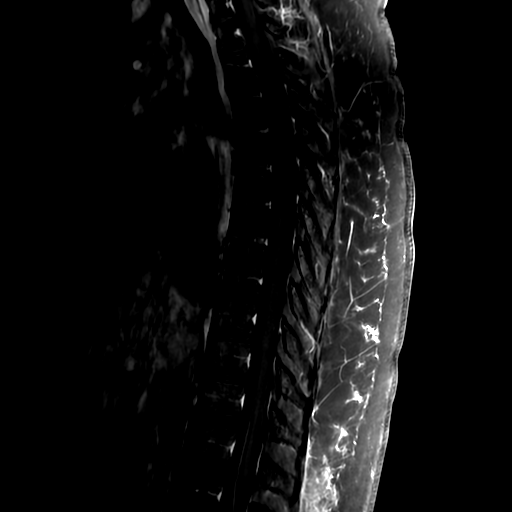
[im 10/17]
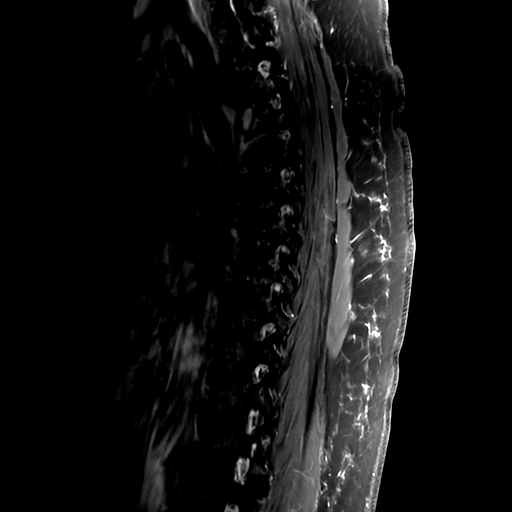
[im 12/17]
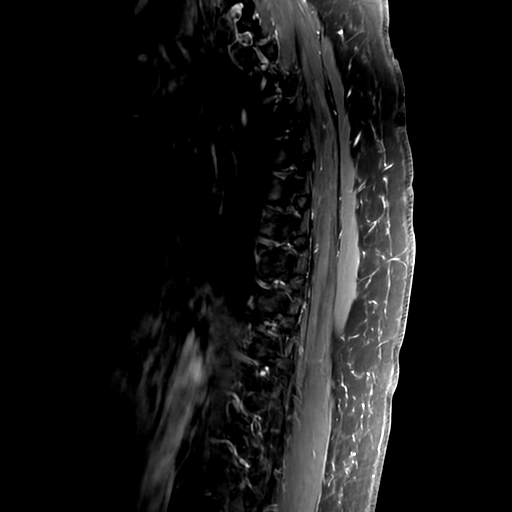
[im 14/17]
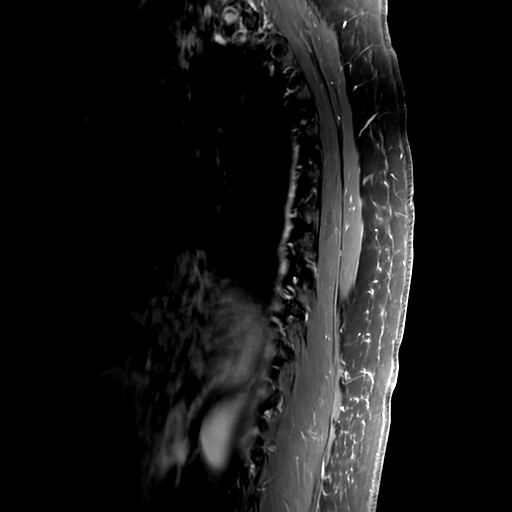
[im 17/17]
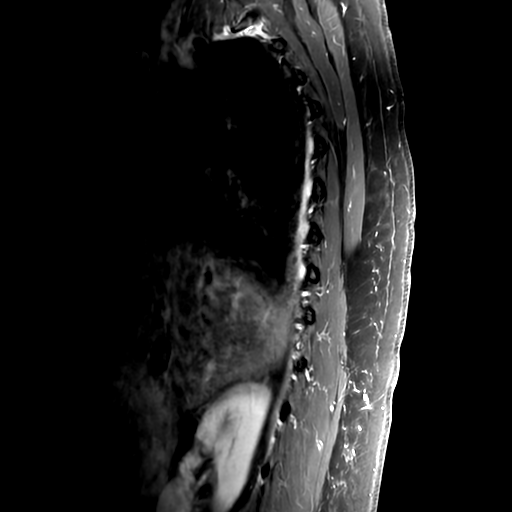

[Series 4: T1 post-contrast · axial · 3.0mm · 0.39mm/px · z∈[-275,-90]mm · 5 of 69 slices shown]
[im 5/69]
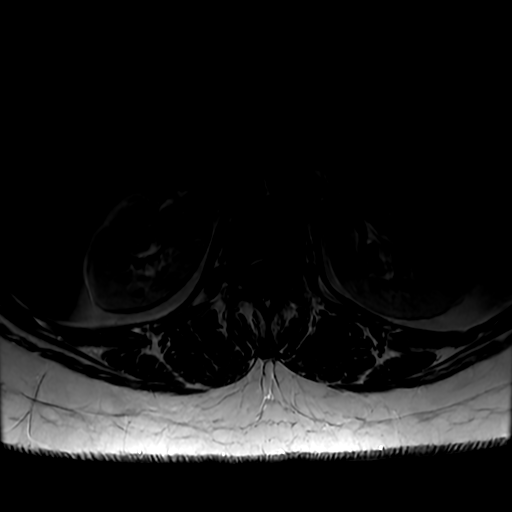
[im 11/69]
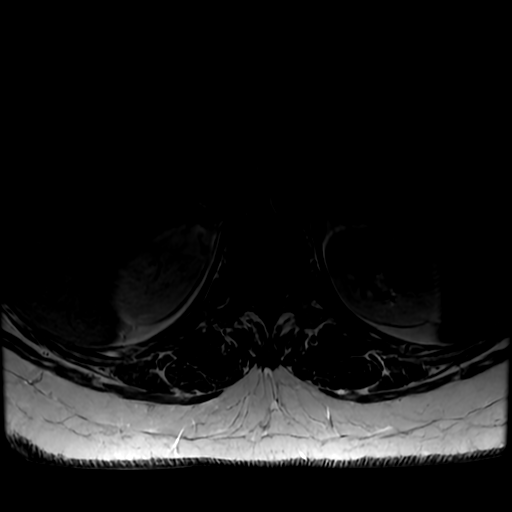
[im 13/69]
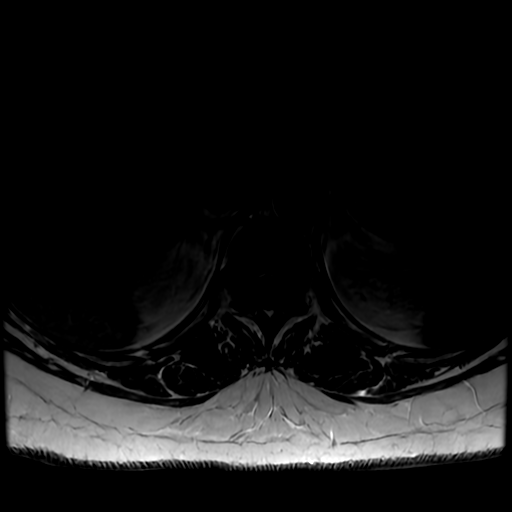
[im 36/69]
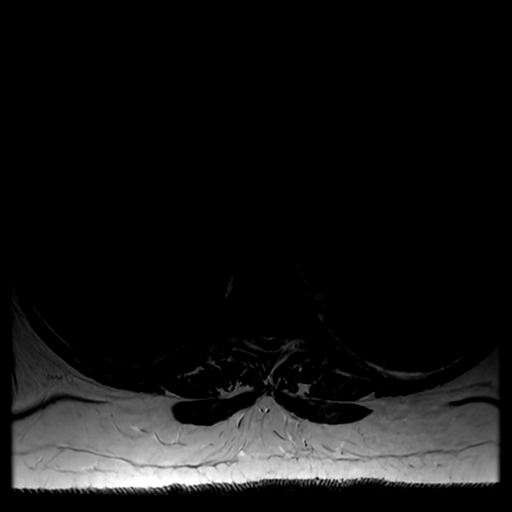
[im 58/69]
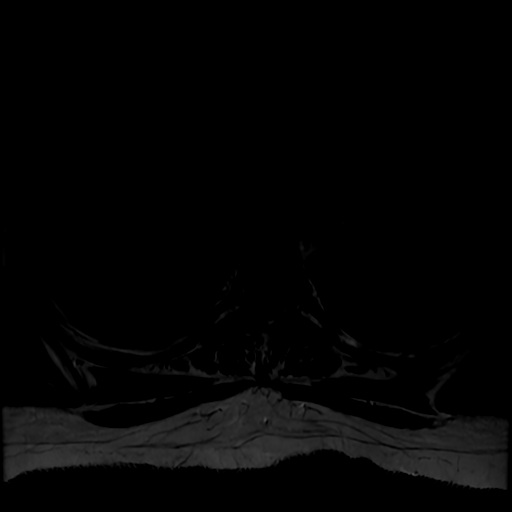

[19 of 48 positions shown; findings below may reference images not displayed]

FINDINGS: Limited cervical spine imaging:  Reported separately today.

Thoracic spine segmentation:  Appears to be normal.

Alignment:  Stable thoracic kyphosis.

Vertebrae: No abnormal vertebral enhancement.

Cord: Negative. No abnormal thoracic spinal cord enhancement.
Leptomeningeal enhancement along the lower cord and visible conus at
T12 and L1 is felt related to physiologic spinal artery. No dural
thickening or enhancement. T2-T3 level spinal cord lesion best
depicted on series 28, image 9 yesterday. Conus medullaris partially
visible at L1 today.

Paraspinal and other soft tissues: Negative.

Disc levels:

Unchanged.
IMPRESSION: Negative.  No abnormal thoracic spinal cord enhancement.

## 2022-01-30 MED ORDER — GADOBUTROL 1 MMOL/ML IV SOLN
7.0000 mL | Freq: Once | INTRAVENOUS | Status: AC | PRN
  Administered 2022-01-30: 7 mL via INTRAVENOUS

## 2022-01-30 MED ORDER — ONDANSETRON 4 MG PO TBDP
ORAL_TABLET | ORAL | Status: AC
  Filled 2022-01-30: qty 1

## 2022-01-30 MED ORDER — ONDANSETRON 4 MG PO TBDP
4.0000 mg | ORAL_TABLET | Freq: Once | ORAL | Status: AC
  Administered 2022-01-30: 4 mg via ORAL

## 2022-01-30 MED ORDER — TRAMADOL HCL 50 MG PO TABS
50.0000 mg | ORAL_TABLET | Freq: Four times a day (QID) | ORAL | Status: DC | PRN
  Administered 2022-01-30 – 2022-01-31 (×3): 50 mg via ORAL
  Filled 2022-01-30 (×3): qty 1

## 2022-01-30 MED ORDER — ADULT MULTIVITAMIN W/MINERALS CH
1.0000 | ORAL_TABLET | Freq: Every day | ORAL | Status: DC
  Administered 2022-01-30 – 2022-01-31 (×2): 1 via ORAL
  Filled 2022-01-30 (×2): qty 1

## 2022-01-30 NOTE — Evaluation (Signed)
Occupational Therapy Evaluation ?Patient Details ?Name: Megan Roach ?MRN: [id number] ?DOB: 24-Jul-1976 ?Today's Date: 01/30/2022 ? ? ?History of Present Illness pt is a 46 y/o female presenting to ED with severe HA, global weakness and inability to speak.  MRI imaging of brain cervical and thracic spne were negative or inconclusive.   PMHx: non contributory.  ? ?Clinical Impression ?  ?Pt admitted for concerns listed above. PTA pt reported that she was independent with all ADL's and IADL's. At this time, pt is limited by dizziness. She is able to complete ADL's and minimal amounts of functional mobility with mod I, however after standing for approximately 25 seconds, pt became dizzy and required mod assist to balance, before quickly needing to return to supine. Most likely pt will return to full independence once dizziness is resolved. OT will follow acutely.   ?   ? ?Recommendations for follow up therapy are one component of a multi-disciplinary discharge planning process, led by the attending physician.  Recommendations may be updated based on patient status, additional functional criteria and insurance authorization.  ? ?Follow Up Recommendations ? No OT follow up  ?  ?Assistance Recommended at Discharge Set up Supervision/Assistance  ?Patient can return home with the following A little help with walking and/or transfers;A little help with bathing/dressing/bathroom;Assistance with cooking/housework ? ?  ?Functional Status Assessment ? Patient has had a recent decline in their functional status and demonstrates the ability to make significant improvements in function in a reasonable and predictable amount of time.  ?Equipment Recommendations ? None recommended by OT  ?  ?Recommendations for Other Services   ? ? ?  ?Precautions / Restrictions Precautions ?Precautions: Fall ?Precaution Comments: dizziness ?Restrictions ?Weight Bearing Restrictions: No  ? ?  ? ?Mobility Bed Mobility ?Overal bed mobility: Modified  Independent ?  ?  ?  ?  ?  ?  ?General bed mobility comments: No assist needed ?  ? ?Transfers ?Overall transfer level: Needs assistance ?  ?Transfers: Sit to/from Stand, Bed to chair/wheelchair/BSC ?Sit to Stand: Modified independent (Device/Increase time) ?  ?  ?  ?  ?  ?General transfer comment: Pt stood with no assist, was steady until dizziness increased causing her to loose her balance. ?  ? ?  ?Balance Overall balance assessment: Needs assistance ?Sitting-balance support: No upper extremity supported, Feet supported ?Sitting balance-Leahy Scale: Good ?Sitting balance - Comments: No asssist needed. ?  ?Standing balance support: Single extremity supported ?Standing balance-Leahy Scale: Fair ?Standing balance comment: Independent until pt became dizzy, then required max-total assist . ?  ?  ?  ?  ?  ?  ?  ?  ?  ?  ?  ?   ? ?ADL either performed or assessed with clinical judgement  ? ?ADL Overall ADL's : Modified independent ?  ?  ?  ?  ?  ?  ?  ?  ?  ?  ?  ?  ?  ?  ?  ?  ?  ?  ?  ?General ADL Comments: in sitting, pt able to complete all ADL's with no assist. As dizziness discipates, pt will be independent with all ADL's in standing  ? ? ? ?Vision Baseline Vision/History: 0 No visual deficits ?Ability to See in Adequate Light: 0 Adequate ?Patient Visual Report: No change from baseline ?Vision Assessment?: No apparent visual deficits ?Additional Comments: Dizziness in standing, not related to orthostatic hypotension  ?   ?Perception   ?  ?Praxis   ?  ? ?Pertinent  Vitals/Pain Pain Assessment ?Pain Assessment: Faces ?Faces Pain Scale: Hurts a little bit ?Pain Location: head ?Pain Descriptors / Indicators: Aching, Throbbing ?Pain Intervention(s): Monitored during session, Repositioned  ? ? ? ?Hand Dominance Right ?  ?Extremity/Trunk Assessment Upper Extremity Assessment ?Upper Extremity Assessment: Generalized weakness ?  ?Lower Extremity Assessment ?Lower Extremity Assessment: Defer to PT evaluation ?  ?Cervical /  Trunk Assessment ?Cervical / Trunk Assessment: Normal ?  ?Communication Communication ?Communication: Other (comment) (stuttering presently which is not baseline.) ?  ?Cognition Arousal/Alertness: Awake/alert ?Behavior During Therapy: [facility] for tasks assessed/performed ?Overall Cognitive Status: Within Functional Limits for tasks assessed ?  ?  ?  ?  ?  ?  ?  ?  ?  ?  ?  ?  ?  ?  ?  ?  ?  ?  ?  ?General Comments  VSS on RA ? ?  ?Exercises   ?  ?Shoulder Instructions    ? ? ?Home Living Family/patient expects to be discharged to:: Private residence ?Living Arrangements: Spouse/significant other;Children ?Available Help at Discharge: Family;Available PRN/intermittently ?Type of Home: House ?Home Access: Stairs to enter ?Entrance Stairs-Number of Steps: 3 ?Entrance Stairs-Rails: Right;Left ?Home Layout: Multi-level ?Alternate Level Stairs-Number of Steps: flight ?Alternate Level Stairs-Rails: Right;Left ?Bathroom Shower/Tub: Tub/shower unit;Walk-in shower ?  ?Bathroom Toilet: Standard (comfort) ?Bathroom Accessibility: Yes ?  ?Home Equipment: None ?  ?  ?  ? ?  ?Prior Functioning/Environment Prior Level of Function : Independent/Modified Independent ?  ?  ?  ?  ?  ?  ?  ?  ?  ? ?  ?  ?OT Problem List: Decreased activity tolerance;Impaired balance (sitting and/or standing);Impaired vision/perception ?  ?   ?OT Treatment/Interventions: Self-care/ADL training;Therapeutic exercise;Energy conservation;DME and/or AE instruction;Therapeutic activities;Patient/family education;Balance training;Visual/perceptual remediation/compensation  ?  ?OT Goals(Current goals can be found in the care plan section) Acute Rehab OT Goals ?Patient Stated Goal: To return to independence ?OT Goal Formulation: With patient ?Time For Goal Achievement: 02/13/22 ?Potential to Achieve Goals: Good ?ADL Goals ?Additional ADL Goal #1: Pt will tolerate transfers indepednently with no dizziness. ?Additional ADL Goal #2: Pt and OT will work together on  techniques to lessen dizziness and make mobility safer.  ?OT Frequency: Min 1X/week ?  ? ?Co-evaluation   ?  ?  ?  ?  ? ?  ?AM-PAC OT "6 Clicks" Daily Activity     ?Outcome Measure Help from another person eating meals?: None ?Help from another person taking care of personal grooming?: None ?Help from another person toileting, which includes using toliet, bedpan, or urinal?: None ?Help from another person bathing (including washing, rinsing, drying)?: None ?Help from another person to put on and taking off regular upper body clothing?: None ?Help from another person to put on and taking off regular lower body clothing?: None ?6 Click Score: 24 ?  ?End of Session Equipment Utilized During Treatment: Gait belt ?Nurse Communication: Mobility status ? ?Activity Tolerance: Patient tolerated treatment well ?Patient left: in bed;with call bell/phone within reach;with family/visitor present ? ?OT Visit Diagnosis: Unsteadiness on feet (R26.81);Other abnormalities of gait and mobility (R26.89);Muscle weakness (generalized) (M62.81)  ?              ?Time: 4709-6283 ?OT Time Calculation (min): 12 min ?Charges:  OT General Charges ?$OT Visit: 1 Visit ?OT Evaluation ?$OT Eval Moderate Complexity: 1 Mod ? ?Sharece Fleischhacker H., OTR/L ?Acute Rehabilitation ? ?Lavaeh Bau Elane Yolanda Bonine ?01/30/2022, 6:47 PM ?

## 2022-01-30 NOTE — Progress Notes (Signed)
NEUROLOGY CONSULTATION PROGRESS NOTE  ? ?Date of service: January 30, 2022 ?Patient Name: Megan Roach ?MRN:  [id number] ?DOB:  [birth year] ? ?Brief HPI  ?Megan Roach is a 46 y.o. female with PMH significant for asthma who woke up with 10/10 biparietal throbbing headache at 0400.  Took 2 Advil's, went to the bathroom and was able to walk fine.  She then went to sleep.  She got up at 0800 headache was significantly improved.  However, she was unable to move her arms or her legs and unable to talk.  Headache was holocephalic and still throbbing with some associated photophobia, phonophobia, no nausea or vomiting, no neck stiffness.  She felt that she was able to mouth words.  Husband reports that her eyes were partially open and she was able to make brief eye contact. ? ?No prior similar episodes.  No fever.  Recent travel to [place] for 2 weeks. ? ?Family called EMS and she was brought into the emergency department.  Her headache briefly worsen in the ED and she was given headache cocktail which helped her rest and symptoms have gradually been improving since. ? ?Initial work-up with CT head without contrast and CT angio head and neck and CT venogram with no acute intracranial abnormality, no significant stenosis or large vessel occlusion, no dural sinus venous thrombosis. ? ?Further work-up with MRI brain, C-spine and T-spine without contrast with subtle foci of cord signal abnormality at C7-T1 and T2-T3 concerning for potential transverse myelitis.  Further work-up with MRI cervical and thoracic spine with contrast with no abnormal cord signal or enhancement at C7-T1 cord lesion or elsewhere. ? ?She does endorse significant stress from recent death of a loved one.  She endorses globus sensation in the back of her throat along with headache for the last couple weeks.  Frowns, tears up and avoids eye contact when talking about this. She is also going through menopause which she feels is adding to this. ?   ?Interval Hx  ? ?Symptoms continue to improve. Now moving all extremities spontaneously and antigravity. Stuttering speech is improved and resolves when engaging in a conversation before reappearing. ? ?Vitals  ? ?Vitals:  ? 01/30/22 0035 01/30/22 0135 01/30/22 0333 01/30/22 0733  ?BP: 113/79 110/75 104/67 108/70  ?Pulse:    74  ?Resp: 19 20 20 20   ?Temp: 98 ?F (36.7 ?C)  98 ?F (36.7 ?C) 98 ?F (36.7 ?C)  ?TempSrc: Oral  Oral Oral  ?SpO2: 99% 100% 100% 100%  ?  ? ?There is no height or weight on file to calculate BMI. ? ?Physical Exam  ? ?General: Laying comfortably in bed; in no acute distress.  ?HENT: Normal oropharynx and mucosa. Normal external appearance of ears and nose.  ?Neck: Supple, no pain or tenderness  ?CV: No JVD. No peripheral edema.  ?Pulmonary: Symmetric Chest rise. Normal respiratory effort.  ?Abdomen: Soft to touch, non-tender.  ?Ext: No cyanosis, edema, or deformity  ?Skin: No rash. Normal palpation of skin.   ?Musculoskeletal: Normal digits and nails by inspection. No clubbing.  ? ?Neurologic Examination  ?Mental status/Cognition: Alert, oriented to self, place, month and year, good attention.  ?Speech/language: intermittently stutters, mostly at the beginning of the conversation but this improves as we continue with conversation. Fluent, comprehension intact, object naming intact, repetition intact.  ?Cranial nerves:  ? CN II Pupils equal and reactive to light, no VF deficits   ? CN III,IV,VI EOM intact, no gaze preference or deviation, no nystagmus   ?  CN V Decreased to 80 to 90% on right face compared to the left face.  ? CN VII no asymmetry, no nasolabial fold flattening   ? CN VIII normal hearing to speech   ? CN IX & X normal palatal elevation, no uvular deviation   ? CN XI 5/5 head turn and 5/5 shoulder shrug bilaterally   ? CN XII midline tongue protrusion   ? ?Motor:  ?Muscle bulk: normal, tone normal, pronator drift none tremor none ?Mvmt Root Nerve  Muscle Right Left Comments  ?SA  C5/6 Ax Deltoid 5 5   ?EF C5/6 Mc Biceps 5 5   ?EE C6/7/8 Rad Triceps 5 5   ?WF C6/7 Med FCR     ?WE C7/8 PIN ECU     ?F Ab C8/T1 U ADM/FDI 5 5 Some giveaway component to her weakness but strength 5/5 in RUE on testing again.  ?HF L1/2/3 Fem Illopsoas 5 5   ?KE L2/3/4 Fem Quad 5 5   ?DF L4/5 D Peron Tib Ant 5 5   ?PF S1/2 Tibial Grc/Sol 5 5   ? ?Reflexes: ? Right Left Comments  ?Pectoralis     ? Biceps (C5/6) 2 2   ?Brachioradialis (C5/6) 2 2   ? Triceps (C6/7) 2 2   ? Patellar (L3/4) 2 2   ? Achilles (S1)     ? Hoffman     ? Plantar     ?Jaw jerk   ? ?Sensation: ? Light touch Decreased to 80-90% in RUE and RLE compared to LUE and LLE.  ? Pin prick   ? Temperature   ? Vibration   ?Proprioception   ? ?Coordination/Complex Motor:  ?- Finger to Nose intact BL ?- Heel to shin intact BL ?- Rapid alternating movement are slowed in RUE ?- Gait: Swaying to the side and R leg gives out when attempting to stand her up. Deferred further gait assessment for patient safety. ? ?Labs  ? ?Basic Metabolic Panel:  ?Lab Results  ?Component Value Date  ? NA 141 01/29/2022  ? K 4.2 01/29/2022  ? CO2 25 01/29/2022  ? GLUCOSE 104 (H) 01/29/2022  ? BUN 14 01/29/2022  ? CREATININE 0.80 01/29/2022  ? CALCIUM 9.1 01/29/2022  ? GFRNONAA >60 01/29/2022  ? ?HbA1c:  ?Lab Results  ?Component Value Date  ? HGBA1C 5.4 12/03/2021  ? ?LDL:  ?Lab Results  ?Component Value Date  ? LDLCALC 120 (H) 01/30/2022  ? ?Urine Drug Screen:  ?   ?Component Value Date/Time  ? LABOPIA NONE DETECTED 01/29/2022 1021  ? COCAINSCRNUR NONE DETECTED 01/29/2022 1021  ? LABBENZ NONE DETECTED 01/29/2022 1021  ? AMPHETMU NONE DETECTED 01/29/2022 1021  ? THCU NONE DETECTED 01/29/2022 1021  ? LABBARB NONE DETECTED 01/29/2022 1021  ?  ?Alcohol Level  ?   ?Component Value Date/Time  ? ETH <10 01/29/2022 1021  ? ?No results found for: PHENYTOIN, ZONISAMIDE, LAMOTRIGINE, LEVETIRACETA ?No results found for: PHENYTOIN, PHENOBARB, VALPROATE, CBMZ ? ?Imaging and Diagnostic studies   ? ?MRI Brain, C, T spine without contrast: ?1. Subtle foci of cord signal abnormality at the levels of C7-T1 and ?T2-3, nonspecific, but suspicious for possible demyelinating ?disease/multiple sclerosis. There is question of subtle associated ?cord expansion about the lesion at C7-T1, which could reflect active ?demyelination. Correlation with postcontrast imaging suggested. ?2. Mild noncompressive disc bulging at C5-6 and C6-7 without ?stenosis. ?3. Small layering bilateral pleural effusions. ? ? ?MRI C and T spine with contrast: ?Negative, no abnormal spinal cord  enhancement at the C7-T1 cord ?lesion or elsewhere. ? ?Impression  ? ?Megan Roach is a 46 y.o. female with acute onset headache associated with photophobia and decreased responsiveness. Headache has resolved and unresponsiveness improved  with now awake, alert, following commands. Intermittent stuttering of her speech which is most prominent at the beginning of conversation along R facial, RUE and RLE numbness. Workup with MRI brain negative. MRI C spine with a C7-T1 T2/STIR hyperintense lesion with no contrast enhancement. 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 The lesion would not explain her speech and unresponsiveness and I think this is probably an incidental finding. My suspicion for a potential active CNS viral and autoimmune causes of transverse myelitis is low given no contrast enhancement. It is reasonable to evaluate for nutritional causes of Transverse Myelitis and will also have them follow up with neurology outpatient. ? ?Recommendations  ?- Serum copper with serial plasmin, vitamin D, NMO antibody panel, Mog antibody. ?- vit B12 is low from a neurological stand point. Would recommend keeping at or above 500. Recommend daily multivitamin ?- Discussed with patient and husband about follow up with Psychiatry to help with  medications for underlying stress and to see a psychotherapist to help patient cope better with the stress. This can be done outpatient. ?- Recommend follow up with Neurologist outpatient for further monitoring

## 2022-01-30 NOTE — Evaluation (Signed)
Physical Therapy Evaluation ?Patient Details ?Name: Megan Roach ?MRN: [id number] ?DOB: 22-Jul-1976 ?Today's Date: 01/30/2022 ? ?History of Present Illness ? pt is a 46 y/o female presenting to ED with severe HA, global weakness and inability to speak.  MRI imaging of brain cervical and thracic spne were negative or inconclusive.   PMHx: non contributory.  ?Clinical Impression ? Pt admitted with/for stroke and presently needing min assist of 1-2 persons for basic mobility and gait.  Pt currently limited functionally due to the problems listed below.  (see problems list.)  Pt will benefit from PT to maximize function and safety to be able to get home safely with available assist. ?   ?   ? ?Recommendations for follow up therapy are one component of a multi-disciplinary discharge planning process, led by the attending physician.  Recommendations may be updated based on patient status, additional functional criteria and insurance authorization. ? ?Follow Up Recommendations Outpatient PT ? ?  ?Assistance Recommended at Discharge    ?Patient can return home with the following ? A little help with walking and/or transfers;A little help with bathing/dressing/bathroom ? ?  ?Equipment Recommendations None recommended by PT  ?Recommendations for Other Services ?    ?  ?Functional Status Assessment Patient has had a recent decline in their functional status and demonstrates the ability to make significant improvements in function in a reasonable and predictable amount of time.  ? ?  ?Precautions / Restrictions Precautions ?Precautions: Fall ?Precaution Comments: dizziness  ? ?  ? ?Mobility ? Bed Mobility ?Overal bed mobility: Needs Assistance ?Bed Mobility: Supine to Sit, Sit to Sidelying ?  ?  ?Supine to sit: Min guard ?  ?Sit to sidelying: Min guard ?General bed mobility comments: Initially with minimal assis, but then afterward min guard ?  ? ?Transfers ?Overall transfer level: Needs assistance ?  ?Transfers: Sit to/from  Stand, Bed to chair/wheelchair/BSC ?Sit to Stand: Min assist ?Stand pivot transfers: Min guard ?  ?  ?  ?  ?General transfer comment: cues for hand placement, assist forward and to boost minimally ?  ? ?Ambulation/Gait ?Ambulation/Gait assistance: Min assist ?Gait Distance (Feet): 50 Feet ?Assistive device: 2 person hand held assist ?Gait Pattern/deviations: Step-through pattern ?Gait velocity: slower ?Gait velocity interpretation: <1.8 ft/sec, indicate of risk for recurrent falls ?  ?General Gait Details: pt with general unsteadiness, drift and list R, 2 episodes where pt stopped and leaned heavily against this therapis. ? ?Stairs ?  ?  ?  ?  ?  ? ?Wheelchair Mobility ?  ? ?Modified Rankin (Stroke Patients Only) ?Modified Rankin (Stroke Patients Only) ?Pre-Morbid Rankin Score: No symptoms ?Modified Rankin: Moderately severe disability ? ?  ? ?Balance Overall balance assessment: Needs assistance ?Sitting-balance support: No upper extremity supported, Feet supported ?Sitting balance-Leahy Scale: Fair ?Sitting balance - Comments: No asssist needed. ?  ?Standing balance support: Bilateral upper extremity supported, Single extremity supported ?Standing balance-Leahy Scale: Poor ?  ?  ?  ?  ?  ?  ?  ?  ?  ?  ?  ?  ?   ? ? ? ?Pertinent Vitals/Pain Pain Assessment ?Pain Assessment: Faces ?Pain Score: 5  ?Faces Pain Scale: Hurts a little bit ?Pain Location: head ?Pain Descriptors / Indicators: Aching, Throbbing ?Pain Intervention(s): Monitored during session  ? ? ?Home Living Family/patient expects to be discharged to:: Private residence ?Living Arrangements: Spouse/significant other;Children ?Available Help at Discharge: Family;Available PRN/intermittently ?Type of Home: House ?Home Access: Stairs to enter ?Entrance Stairs-Rails: Right;Left ?Entrance Stairs-Number of  Steps: 3 ?Alternate Level Stairs-Number of Steps: flight ?Home Layout: Multi-level ?  ?   ?  ?Prior Function Prior Level of Function : Independent/Modified  Independent ?  ?  ?  ?  ?  ?  ?  ?  ?  ? ? ?Hand Dominance  ?   ? ?  ?Extremity/Trunk Assessment  ? Upper Extremity Assessment ?Upper Extremity Assessment: Defer to OT evaluation ?  ? ?Lower Extremity Assessment ?Lower Extremity Assessment: Generalized weakness ?  ? ?   ?Communication  ? Communication: Other (comment) (stuttering presently which is not baseline.)  ?Cognition Arousal/Alertness: Awake/alert ?Behavior During Therapy: [facility] for tasks assessed/performed ?Overall Cognitive Status: Within Functional Limits for tasks assessed ?  ?  ?  ?  ?  ?  ?  ?  ?  ?  ?  ?  ?  ?  ?  ?  ?  ?  ?  ? ?  ?General Comments   ? ?  ?Exercises    ? ?Assessment/Plan  ?  ?PT Assessment Patient needs continued PT services  ?PT Problem List   ? ?   ?  ?PT Treatment Interventions DME instruction;Gait training;Stair training;Functional mobility training;Therapeutic activities;Therapeutic exercise;Balance training;Neuromuscular re-education;Patient/family education   ? ?PT Goals (Current goals can be found in the Care Plan section)  ?Acute Rehab PT Goals ?Patient Stated Goal: back to PLOF and work ?PT Goal Formulation: With patient ?Time For Goal Achievement: 02/13/22 ?Potential to Achieve Goals: Good ? ?  ?Frequency Min 4X/week ?  ? ? ?Co-evaluation   ?  ?  ?  ?  ? ? ?  ?AM-PAC PT "6 Clicks" Mobility  ?Outcome Measure Help needed turning from your back to your side while in a flat bed without using bedrails?: A Little ?Help needed moving from lying on your back to sitting on the side of a flat bed without using bedrails?: A Little ?Help needed moving to and from a bed to a chair (including a wheelchair)?: A Little ?Help needed standing up from a chair using your arms (e.g., wheelchair or bedside chair)?: A Little ?Help needed to walk in hospital room?: None ?Help needed climbing 3-5 steps with a railing? : A Little ?6 Click Score: 19 ? ?  ?End of Session   ?Activity Tolerance: Patient tolerated treatment well;Patient limited by  fatigue ?Patient left: with call bell/phone within reach;in bed;with family/visitor present;with bed alarm set ?Nurse Communication: Mobility status ?PT Visit Diagnosis: Unsteadiness on feet (R26.81);Muscle weakness (generalized) (M62.81) ?  ? ?Time: 8381-8403 ?PT Time Calculation (min) (ACUTE ONLY): 33 min ? ? ?Charges:   PT Evaluation ?$PT Eval Moderate Complexity: 1 Mod ?PT Treatments ?$Gait Training: 8-22 mins ?  ?   ? ? ?01/30/2022 ? ?Ginger Carne., PT ?Acute Rehabilitation Services ?[phone]  (pager) ?[phone]  (office) ? ?Tessie Fass Bernyce Brimley ?01/30/2022, 5:48 PM ? ?
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
## 2022-01-30 NOTE — Discharge Instructions (Addendum)
Please contact one of the following outpatient psychiatry/counseling facilities to start medication management and therapy services:  ? ?[facility] [place] ?[address]  ?[place], [place] [address] ?[phone]  ? ?[facility]  ?[place], [place] [address] ?[phone] ? ?[facility]  ?[place], [place], [place] [address] ?[phone] ? ?[facility]  ?[address]  ?[place], [place] [address] ?[phone] ? ?[facility]  ?[address] ?[place], [place] [address] ?[phone] ? ?[facility]  ?[address],  ?[place], [place] [address] ?[phone] ? ?

## 2022-01-30 NOTE — Progress Notes (Addendum)
?PROGRESS NOTE ? ? ? ?Megan Roach  LNL:[id number] DOB: 31-Mar-1976 DOA: 01/29/2022 ?PCP: Maximiano Coss, NP  ? ? ? ?Brief Narrative:  ?H/o asthma, GERD  hot flashes on effexor for peri menopause, reports having a hard time grieving the loss of her father several months ago , she presents with thunderclap headache, weakness, abnormal speech  ? ? ?Subjective: ? ?Speech is still slurred , still have headache and weakness ?Husband at bedside  ? ?Assessment & Plan: ? Principal Problem: ?  Thunderclap headache with stroke like symptoms  ?Active Problems: ?  Stroke-like symptoms ?  Peri-menopause ?  Microscopic hematuria ? ? ? ?Assessment and Plan: ? ?thunderclap headache, weakness, abnormal speech  ?Brain imaging CT head/CTA head neck /CT venogram head /brain MRI no no acute findings ?-MRI C spine with a C7-T1 T2/STIR hyperintense lesion with no contrast enhancement, seen by neurology who donot think these lesions are acute ?-further work up per neurology , will follow recommendation ?-PT/OT/speech, CIR ? Symptoms appear are improving  ?-patient desires to talk to psychiatry while here , order placed ? ? ?Microscopic hematuria ?Repeat outpatient and refer to urology if needed  ? ?Peri-menopause ?Continue effexor. Had dose increased to 35m about 2+weeks ago, can cause  Headaches. Will continue as do not want withdrawal symptoms. ? ? ? ?I have Reviewed nursing notes, Vitals, pain scores, I/o's, Lab results and  imaging results since pt's last encounter, details please see discussion above  ?I ordered the following labs:  ?Unresulted Labs (From admission, onward)  ? ?  Start     Ordered  ? 01/30/22 1032  VITAMIN D 25 Hydroxy (Vit-D Deficiency, Fractures)  Once,   R       ?Question:  Specimen collection method  Answer:  Lab=Lab collect  ? 01/30/22 1032  ? 01/30/22 1026  Methylmalonic acid, serum  Once,   R       ? 01/30/22 1025  ? 01/30/22 1025  Vitamin E  Once,   R       ? 01/30/22 1025  ? 01/30/22 1025  Copper, serum   Once,   R       ? 01/30/22 1025  ? 01/30/22 1025  Ceruloplasmin  Once,   R       ? 01/30/22 1025  ? 01/30/22 1025  Neuromyelitis optica autoab, IgG  Once,   R       ? 01/30/22 1025  ? 01/30/22 1025  Miscellaneous [facility] test (send-out)  Once,   R       ?Comments: Anti MOG Ab ?  ?Question:  Test name / description:  Answer:  Anti MOG Ab  ? 01/30/22 1025  ? ?  ?  ? ?  ? ? ? ?DVT prophylaxis: SCD's Start: 01/29/22 1817 ? ? ?Code Status:   Code Status: Full Code ? ?Family Communication: husband at bedside  ?Disposition:  ? ?Status is: Observation ? ? ?Dispo: The patient is from: home ?             Anticipated d/c is to: home vs CIR? ?             Anticipated d/c date is: likely on 4/17 ? ? ? ? ?Objective: ?Vitals:  ? 01/30/22 0035 01/30/22 0135 01/30/22 0333 01/30/22 0733  ?BP: 113/79 110/75 104/67 108/70  ?Pulse:    74  ?Resp: 19 20 20 20   ?Temp: 98 ?F (36.7 ?C)  98 ?F (36.7 ?C) 98 ?F (36.7 ?C)  ?TempSrc: Oral  Oral Oral  ?SpO2: 99% 100% 100% 100%  ? ? ?Intake/Output Summary (Last 24 hours) at 01/30/2022 1055 ?Last data filed at 01/30/2022 0900 ?Gross per 24 hour  ?Intake 1099 ml  ?Output --  ?Net 1099 ml  ? ?There were no vitals filed for this visit. ? ?Examination: ? ?General exam: aaox3, appear weak, slight dysarthria  ?Respiratory system: Clear to auscultation. Respiratory effort normal. ?Cardiovascular system:  RRR.  ?Gastrointestinal system: Abdomen is nondistended, soft and nontender.  Normal bowel sounds heard. ?Central nervous system: Alert and orientedx3, No focal neurological deficits. ?Extremities:  no edema ?Skin: No rashes, lesions or ulcers ?Psychiatry: Judgement and insight appear normal. Mood & affect appropriate.  ? ? ? ?Data Reviewed: I have personally reviewed  labs and visualized  imaging studies since the last encounter and formulate the plan  ? ? ? ? ? ? ?Scheduled Meds: ?  stroke: early stages of recovery book   Does not apply Once  ? venlafaxine XR  75 mg Oral Q breakfast  ? ?Continuous  Infusions: ? ? LOS: 0 days  ? ? ? ?Florencia Reasons, MD PhD FACP ?[facility] ? ?Available via Epic secure chat 7am-7pm for nonurgent issues ?Please page for urgent issues ?To page the attending provider between 7A-7P or the covering provider during after hours 7P-7A, please log into the web site [email or web address] and access using universal [place] password for that web site. If you do not have the password, please call the hospital operator. ? ? ? ?01/30/2022, 10:55 AM  ? ? ?

## 2022-01-30 NOTE — Evaluation (Signed)
Speech Language Pathology Evaluation ?Patient Details ?Name: Megan Roach ?MRN: [id number] ?DOB: [birth year] ?Today's Date: 01/30/2022 ?Time: 9242-6834 ?SLP Time Calculation (min) (ACUTE ONLY): 19 min ? ?Problem List:  ?Patient Active Problem List  ? Diagnosis Date Noted  ? Thunderclap headache with stroke like symptoms  01/29/2022  ? Stroke-like symptoms 01/29/2022  ? Peri-menopause 01/29/2022  ? Microscopic hematuria 01/29/2022  ? ?Past Medical History:  ?Past Medical History:  ?Diagnosis Date  ? Asthma   ? ?Past Surgical History:  ?Past Surgical History:  ?Procedure Laterality Date  ? TOTAL VAGINAL HYSTERECTOMY    ? ?HPI:  ?Megan Roach is a 46 y.o. female with PMH significant for asthma who woke up with 10/10 biparietal throbbing headache at 0400.  Took 2 Advil's, went to the bathroom and was able to walk fine.  She then went to sleep.  She got up at 0800 headache was significantly improved.  However, she was unable to move her arms or her legs and unable to talk.  Headache was holocephalic and still throbbing with some associated photophobia, phonophobia, no nausea or vomiting, no neck stiffness.  She felt that she was able to mouth words.  Husband reports that her eyes were partially open and she was able to make brief eye contact. She was brought in by EMS and testing ensued. HEAD CT was WNL.  Further work-up with MRI brain, C-spine and T-spine without contrast with subtle foci of cord signal abnormality at C7-T1 and T2-T3 concerning for potential transverse myelitis. Stuttering comes and goes.  ? ?Assessment / Plan / Recommendation ?Clinical Impression ? 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 Husband was present for evaluation and reported that her speech is much improved compared to yesterday and even compared to this morning. She was completely unable to speak upon initial presentation to EMS, but is now speaking coherently  without word finding errors. Fluency is characterized by initial phoneme prolongations with some intermittent hesitations and repetitions. These dysfluencies were observed in conversation, with automatic speech tasks, and in repetition tasks. They did appear to be most significant in conversational speech. Cognition was briefly assessed and determined to be WNL for orientation, attention, memory, problem solving, and judgment.  ? ?Recommend: outpatient SLP if fluency/speech disorder persists ?   ?SLP Assessment ? SLP Recommendation/Assessment: All further Speech Lanaguage Pathology  needs can be addressed in the next venue of care ?SLP Visit Diagnosis: Dysarthria and anarthria (R47.1)  ?  ?Recommendations for follow up therapy are one component of a multi-disciplinary discharge planning process, led by the attending physician.  Recommendations may be updated based on patient status, additional functional criteria and insurance authorization. ?   ?Follow Up Recommendations ? Outpatient SLP  ?  ?Functional Status Assessment Patient has had a recent decline in their functional status and demonstrates the ability to make significant improvements in function in a reasonable and predictable amount of time.  ?   ?SLP Evaluation ?Cognition ? Overall Cognitive Status: Within Functional Limits for tasks assessed ?Arousal/Alertness: Awake/alert ?Orientation Level: Oriented X4 ?Memory: Appears intact ?Awareness: Appears intact ?Problem Solving: Appears intact ?Safety/Judgment: Appears intact  ?  ?   ?Comprehension ? Auditory Comprehension ?Overall Auditory Comprehension: Appears within functional limits for tasks assessed  ?  ?Expression Expression ?Primary Mode of Expression: Verbal ?Verbal Expression ?Overall Verbal Expression: Appears within functional limits for tasks assessed ?Naming: No impairment ?Pragmatics: No impairment   ?Oral / Motor ? Oral Motor/Sensory Function ?Overall Oral Motor/Sensory Function:  Within  functional limits ?Motor Speech ?Overall Motor Speech: Impaired ?Respiration: Within functional limits ?Phonation: Normal ?Resonance: Within functional limits ?Articulation: Within functional limitis ?Intelligibility: Intelligible ?Motor Planning: Witnin functional limits ?Motor Speech Errors: Aware ?Effective Techniques: Slow rate;Pacing   ?Larita Deremer P. Mikella Linsley, M.S., CCC-SLP ?Speech-Language Pathologist ?Acute Rehabilitation Services ?Pager: [phone] ?        ? ?[place] ?01/30/2022, 3:44 PM ? ?

## 2022-01-31 DIAGNOSIS — F4321 Adjustment disorder with depressed mood: Secondary | ICD-10-CM | POA: Diagnosis not present

## 2022-01-31 DIAGNOSIS — R2990 Unspecified symptoms and signs involving the nervous system: Secondary | ICD-10-CM | POA: Diagnosis not present

## 2022-01-31 LAB — COPPER, SERUM: Copper: 143 ug/dL (ref 80–158)

## 2022-01-31 MED ORDER — METOCLOPRAMIDE HCL 5 MG/ML IJ SOLN
10.0000 mg | Freq: Once | INTRAMUSCULAR | Status: AC
  Administered 2022-01-31: 10 mg via INTRAVENOUS
  Filled 2022-01-31: qty 2

## 2022-01-31 MED ORDER — KETOROLAC TROMETHAMINE 15 MG/ML IJ SOLN
15.0000 mg | Freq: Once | INTRAMUSCULAR | Status: AC
  Administered 2022-01-31: 15 mg via INTRAVENOUS
  Filled 2022-01-31: qty 1

## 2022-01-31 MED ORDER — EXCEDRIN MIGRAINE 250-250-65 MG PO TABS: 1.0000 | ORAL_TABLET | Freq: Four times a day (QID) | ORAL | 0 refills | Status: DC | PRN

## 2022-01-31 MED ORDER — DIPHENHYDRAMINE HCL 25 MG PO CAPS: 25.0000 mg | ORAL_CAPSULE | Freq: Four times a day (QID) | ORAL | 0 refills | Status: DC | PRN

## 2022-01-31 MED ORDER — DIPHENHYDRAMINE HCL 50 MG/ML IJ SOLN
50.0000 mg | Freq: Once | INTRAMUSCULAR | Status: AC
  Administered 2022-01-31: 50 mg via INTRAVENOUS
  Filled 2022-01-31: qty 1

## 2022-01-31 NOTE — Progress Notes (Signed)
Pt spouse called RN due to pt having same sxs as on admission,pt was having difficulty speaking, severe headache and shakiness, MD called and notified, MD and NP at bedside assessing pt immediately, new orders obtained for migraine related symptoms. ?

## 2022-01-31 NOTE — Progress Notes (Signed)
Neurology Progress Note ? ? ?S:// ?Called back to see patient this am for severe headache and trouble speaking. Per RN and husband she developed a headache this am around 300 am was given tramadol with some improvement in headache. Then this am her headache became very severe with trouble speaking. These are similar symptoms which happened prior on Saturday. She endorses holocephalic headache with light and sound sensitivity and nausea. Her speech is more pressured than dysarthric.  ? ? ?O:// ?Current vital signs: ?BP 116/81 (BP Location: Right Arm)   Pulse 73   Temp 98.1 ?F (36.7 ?C) (Oral)   Resp 16   SpO2 97%  ?Vital signs in last 24 hours: ?Temp:  [97.9 ?F (36.6 ?C)-98.9 ?F (37.2 ?C)] 98.1 ?F (36.7 ?C) (04/17 0756) ?Pulse Rate:  [71-95] 73 (04/17 0756) ?Resp:  [16-20] 16 (04/17 0756) ?BP: (115-134)/(64-84) 116/81 (04/17 0756) ?SpO2:  [96 %-100 %] 97 % (04/17 0756) ? ?GENERAL: Awake, alert in NAD ?HEENT: - Normocephalic and atraumatic, dry mm ?LUNGS - Clear to auscultation bilaterally with no wheezes ?CV - S1S2 RRR, no m/r/g, equal pulses bilaterally. ?ABDOMEN - Soft, nontender, nondistended with normoactive BS ?Ext: warm, well perfused, intact peripheral pulses, no edema ? ?NEURO:  ?Mental Status: AA&Ox4 ?Speech: pressured speech with stuttering, no dysarthria ?Visual: full to threat  ?Pupils: equal and reactive  ?EOM: intact ?Corneal Reflex: Not tested in the awake patient ?Face: Symmetric  ?Cough/Gag: Not tested in this awake patient ?Tone: is normal and bulk is normal ?Sensation- decreased sensation on right side with splitting of vibratory sensation on the forehead ?Coordination: FTN intact bilaterally, no ataxia in BLE. ?Gait- deferred ? ? ?Medications ? ?Current Facility-Administered Medications:  ?   stroke: early stages of recovery book, , Does not apply, Once, Orma Flaming, MD ?  acetaminophen (TYLENOL) tablet 650 mg, 650 mg, Oral, Q4H PRN, 650 mg at 01/31/22 0734 **OR** acetaminophen (TYLENOL)  160 MG/5ML solution 650 mg, 650 mg, Per Tube, Q4H PRN **OR** acetaminophen (TYLENOL) suppository 650 mg, 650 mg, Rectal, Q4H PRN, Orma Flaming, MD ?  multivitamin with minerals tablet 1 tablet, 1 tablet, Oral, Daily, Donnetta Simpers, MD, 1 tablet at 01/31/22 0844 ?  senna-docusate (Senokot-S) tablet 1 tablet, 1 tablet, Oral, QHS PRN, Orma Flaming, MD ?  traMADol Veatrice Bourbon) tablet 50 mg, 50 mg, Oral, Q6H PRN, Florencia Reasons, MD, 50 mg at 01/31/22 0844 ?  venlafaxine XR (EFFEXOR-XR) 24 hr capsule 75 mg, 75 mg, Oral, Q breakfast, Orma Flaming, MD, 75 mg at 01/31/22 0734 ?Labs ?CBC ?   ?Component Value Date/Time  ? WBC 6.2 01/29/2022 1021  ? RBC 4.44 01/29/2022 1021  ? HGB 12.9 01/29/2022 1103  ? HCT 38.0 01/29/2022 1103  ? PLT 307 01/29/2022 1021  ? MCV 89.2 01/29/2022 1021  ? MCH 30.0 01/29/2022 1021  ? MCHC 33.6 01/29/2022 1021  ? RDW 13.4 01/29/2022 1021  ? LYMPHSABS 1.7 01/29/2022 1021  ? MONOABS 0.4 01/29/2022 1021  ? EOSABS 0.1 01/29/2022 1021  ? BASOSABS 0.0 01/29/2022 1021  ? ? ?CMP  ?   ?Component Value Date/Time  ? NA 141 01/29/2022 1103  ? K 4.2 01/29/2022 1103  ? CL 104 01/29/2022 1103  ? CO2 25 01/29/2022 1021  ? GLUCOSE 104 (H) 01/29/2022 1103  ? BUN 14 01/29/2022 1103  ? CREATININE 0.80 01/29/2022 1103  ? CALCIUM 9.1 01/29/2022 1021  ? PROT 6.8 01/29/2022 1021  ? ALBUMIN 3.6 01/29/2022 1021  ? AST 20 01/29/2022 1021  ? ALT 19 01/29/2022  1021  ? ALKPHOS 51 01/29/2022 1021  ? BILITOT 0.6 01/29/2022 1021  ? GFRNONAA >60 01/29/2022 1021  ? ? ?glycosylated hemoglobin ? ?Lipid Panel  ?   ?Component Value Date/Time  ? CHOL 195 01/30/2022 0322  ? TRIG 54 01/30/2022 0322  ? HDL 64 01/30/2022 0322  ? CHOLHDL 3.0 01/30/2022 0322  ? VLDL 11 01/30/2022 0322  ? [place] 120 (H) 01/30/2022 0322  ? ? ? ?Imaging ?I have reviewed images in epic and the results pertinent to this consultation are: ? ?CT-scan of the brain 4/15: ?No acute intracranial pathology ? ?CTA head/neck 4/15: ?1. Mild tortuosity of the cervical  internal carotid arteries bilaterally without significant stenosis. This is nonspecific, but most likely related to hypertension. ?2. Otherwise normal CTA of the neck. ?3. Normal CTA Circle of Willis without significant proximal stenosis, aneurysm, or branch vessel occlusion MRI examination of the brain ? ?CTV head 4/15: ?Normal ? ?MRI Brain 4/15: ?No acute intracranial abnormality ? ?Assessment:  ?46 y.o. female with past medical history of asthma and B12 deficiency who presented with acute onset headache associated with photophobia and decreased responsiveness as well as having difficulty with speaking on 4/15. ? ?This is more than likely a complex migraine versus functional neurological disorder than acute stroke and will treat as such for now  ? ?Recommendations: ?- give migraine cocktail of 42m IV Toradol, 170mIV reglan and 5012mV Benadryl  ? ?DenBeulah GandyP, ACNPC-AG ? ? ?Attending addendum ?Patient seen and examined ?Clinical exam with multiple nonorganic findings concerning for functional etiology.  That said, with a history of concomitant headache, will treat as complex migraine. ?Will benefit from psychiatry consultation ?Please call with questions as needed ? ?-- ?AshAmie PortlandD ?Neurologist ?[facility] ?Pager: [phone]?  ?

## 2022-01-31 NOTE — Progress Notes (Signed)
Physical Therapy Treatment ?Patient Details ?Name: Megan Roach ?MRN: [id number] ?DOB: 03-25-1976 ?Today's Date: 01/31/2022 ? ? ?History of Present Illness pt is a 46 y/o female presenting to ED with severe HA, global weakness and inability to speak.  MRI imaging of brain cervical and thracic spne were negative or inconclusive.   PMHx: non contributory. ? ?  ?PT Comments  ? ? Progressing well, but still symptomatic.  Emphasis on gait stability and safe negotiation of stairs. ?   ?Recommendations for follow up therapy are one component of a multi-disciplinary discharge planning process, led by the attending physician.  Recommendations may be updated based on patient status, additional functional criteria and insurance authorization. ? ?Follow Up Recommendations ? Outpatient PT ?  ?  ?Assistance Recommended at Discharge    ?Patient can return home with the following A little help with walking and/or transfers;A little help with bathing/dressing/bathroom ?  ?Equipment Recommendations ? None recommended by PT  ?  ?Recommendations for Other Services   ? ? ?  ?Precautions / Restrictions Precautions ?Precautions: Fall ?Precaution Comments: dizziness ?Restrictions ?Weight Bearing Restrictions: No  ?  ? ?Mobility ? Bed Mobility ?Overal bed mobility: Modified Independent ?  ?  ?  ?  ?  ?  ?  ?  ? ?Transfers ?Overall transfer level: Needs assistance ?  ?Transfers: Sit to/from Stand ?Sit to Stand: Min assist ?  ?  ?  ?  ?  ?General transfer comment: stability assist only ?  ? ?Ambulation/Gait ?Ambulation/Gait assistance: Min assist ?Gait Distance (Feet): 200 Feet ?Assistive device: 2 person hand held assist ?Gait Pattern/deviations: Step-through pattern ?Gait velocity: slower ?Gait velocity interpretation: <1.8 ft/sec, indicate of risk for recurrent falls ?  ?General Gait Details: mildly unsteady overall with drift, listing L/R to regain balance ? ? ?Stairs ?Stairs: Yes ?Stairs assistance: Min assist ?Stair Management: One  rail Right, One rail Left, Two rails, Alternating pattern, Forwards ?Number of Stairs: 5 ?General stair comments: generally safe with rail (s) for support.  Pt still should have assist on stairs while so vertiginous/dizzy. ? ? ?Wheelchair Mobility ?  ? ?Modified Rankin (Stroke Patients Only) ?Modified Rankin (Stroke Patients Only) ?Pre-Morbid Rankin Score: No symptoms ?Modified Rankin: Moderately severe disability ? ? ?  ?Balance Overall balance assessment: Needs assistance ?Sitting-balance support: No upper extremity supported, Feet supported ?Sitting balance-Leahy Scale: Good ?  ?  ?Standing balance support: Single extremity supported ?Standing balance-Leahy Scale: Fair ?  ?  ?  ?  ?  ?  ?  ?  ?  ?  ?  ?  ?  ? ?  ?Cognition Arousal/Alertness: Awake/alert ?Behavior During Therapy: [facility] for tasks assessed/performed ?Overall Cognitive Status: Within Functional Limits for tasks assessed ?  ?  ?  ?  ?  ?  ?  ?  ?  ?  ?  ?  ?  ?  ?  ?  ?  ?  ?  ? ?  ?Exercises   ? ?  ?General Comments General comments (skin integrity, edema, etc.): husband participated and witness pt's responses to gait and stair negoriation. ?  ?  ? ?Pertinent Vitals/Pain Pain Assessment ?Pain Assessment: Faces ?Faces Pain Scale: Hurts a little bit ?Pain Location: head ?Pain Intervention(s): Monitored during session  ? ? ?Home Living   ?  ?  ?  ?  ?  ?  ?  ?  ?  ?   ?  ?Prior Function    ?  ?  ?   ? ?  PT Goals (current goals can now be found in the care plan section) Acute Rehab PT Goals ?Patient Stated Goal: back to PLOF and work ?PT Goal Formulation: With patient ?Time For Goal Achievement: 02/13/22 ?Potential to Achieve Goals: Good ?Progress towards PT goals: Progressing toward goals ? ?  ?Frequency ? ? ? Min 4X/week ? ? ? ?  ?PT Plan Current plan remains appropriate  ? ? ?Co-evaluation   ?  ?  ?  ?  ? ?  ?AM-PAC PT "6 Clicks" Mobility   ?Outcome Measure ? Help needed turning from your back to your side while in a flat bed without using bedrails?:  None ?Help needed moving from lying on your back to sitting on the side of a flat bed without using bedrails?: None ?Help needed moving to and from a bed to a chair (including a wheelchair)?: A Little ?Help needed standing up from a chair using your arms (e.g., wheelchair or bedside chair)?: A Little ?Help needed to walk in hospital room?: A Little ?Help needed climbing 3-5 steps with a railing? : A Little ?6 Click Score: 20 ? ?  ?End of Session   ?  ?Patient left: with call bell/phone within reach;in bed;with family/visitor present;with bed alarm set ?Nurse Communication: Mobility status ?PT Visit Diagnosis: Unsteadiness on feet (R26.81);Muscle weakness (generalized) (M62.81) ?  ? ? ?Time: 9562-1308 ?PT Time Calculation (min) (ACUTE ONLY): 16 min ? ?Charges:             ?          ? ?01/31/2022 ? ?Ginger Carne., PT ?Acute Rehabilitation Services ?[phone]  (pager) ?[phone]  (office) ? ? ?Tessie Fass Mikea Quadros ?01/31/2022, 12:30 PM ? ?

## 2022-01-31 NOTE — Discharge Summary (Signed)
?Physician Discharge Summary  ?Megan Roach NGE:[id number] DOB: 1976/02/14 DOA: 01/29/2022 ? ?PCP: Maximiano Coss, NP ? ?Admit date: 01/29/2022 ?Discharge date: 01/31/2022 ? ?Admitted From: Home ?Disposition: Home ? ?Recommendations for Outpatient Follow-up:  ?Follow up with PCP in 1-2 weeks ?Recommend referral to outpatient behavioral health for counseling/therapy ?Ambulatory referral placed for neurology evaluation for complex migraine headache ?Continue Benadryl/Excedrin migraine headache as needed ? ?Home Health: No ?Equipment/Devices: None ? ?Discharge Condition: Stable ?CODE STATUS: Full code ?Diet recommendation:  ? ?History of present illness: ? ?Megan Roach is a 46 year old female with past medical history significant for asthma, GERD, hot flashes on Effexor for perimenopause symptoms who presented to [facility] ED on 4/15 with sudden onset severe headache.  Husband reports headache was all over and throbbing in nature.  Patient took some Advil and went back to sleep.  At 8 AM, patient's husband tried to wake her up and she was not responding well with difficulty with speech.  EMS was activated.  Patient was her normal self the previous night.  Patient did tell her husband she was feeling overwhelmed and sad about her father who recently passed.  Patient also reports very fatigued lately hence intermittent spells of dizziness, no loss of sensation or weakness; no visual changes.  Recently traveled to [facility] 1 week ago in [place] 1 month ago. ? ?In the ED, afebrile, bp: 144/87, HR: 76, RR: 20, SpO2100% on room air. CT head: no acute finding ?CTA head/neck: no acute finding. CT venogram: normal.  Neurology was consulted.  Hospitalist service consulted for further evaluation management. ? ?Hospital course: ? ?Complex migraine headache ?Patient presenting to ED with acute onset thunderclap headache with dysarthric speech, global weakness.  CT head without contrast unrevealing.  CTA head/neck  with no acute findings.  CT venogram normal.  Neurology was consulted and followed during hospital course.  MR brain with no acute findings.  MRI C-spine with C7-T1 T2/STIR hyperintense lesion with no contrast-enhancement; per neurology, do not think these lesions are acute and no further work-up warranted.  Neurology believes her symptoms likely related to complex migraine headaches in the setting of significant internal stress with menopause symptoms as well as external stressors with recent loss/grieving of her father.  Seen by PT/OT/speech therapy with recommendations of outpatient follow-up.  Seen by psychiatry with recommendations of outpatient follow-up with behavioral health/therapist.  Ambulatory referral placed to neurology for outpatient follow-up as well.  Recommend continue supportive care, as needed migraine treatment with Benadryl, Excedrin. ? ?Perimenopause ?Continue Effexor 75 mg p.o. daily.  May need to wean/discontinue as this may contributing factor for her underlying headaches as well ? ? ?Discharge Diagnoses:  ?Principal Problem: ?  Grief reaction ?Active Problems: ?  Thunderclap headache with stroke like symptoms  ?  Stroke-like symptoms ? ? ? ?Discharge Instructions ? ?Discharge Instructions   ? ? Ambulatory referral to Neurology   Complete by: As directed ?  ? An appointment is requested in approximately: 2 weeks  ? Ambulatory referral to Physical Therapy   Complete by: As directed ?  ? Ambulatory referral to Physical Therapy   Complete by: As directed ?  ? Ambulatory referral to Speech Therapy   Complete by: As directed ?  ? Call MD for:  difficulty breathing, headache or visual disturbances   Complete by: As directed ?  ? Call MD for:  extreme fatigue   Complete by: As directed ?  ? Call MD for:  persistant dizziness or light-headedness   Complete  by: As directed ?  ? Call MD for:  persistant nausea and vomiting   Complete by: As directed ?  ? Call MD for:  severe uncontrolled pain    Complete by: As directed ?  ? Call MD for:  temperature >100.4   Complete by: As directed ?  ? Diet - low sodium heart healthy   Complete by: As directed ?  ? Increase activity slowly   Complete by: As directed ?  ? ?  ? ?Allergies as of 01/31/2022   ? ?   Reactions  ? Sulfa Antibiotics Swelling  ? ?  ? ?  ?Medication List  ?  ? ?TAKE these medications   ? ?diphenhydrAMINE 25 mg capsule ?Commonly known as: BENADRYL ?Take 1 capsule (25 mg total) by mouth every 6 (six) hours as needed (headache). ?  ?Excedrin Migraine 250-250-65 MG tablet ?Generic drug: aspirin-acetaminophen-caffeine ?Take 1 tablet by mouth every 6 (six) hours as needed for headache or migraine. ?  ?fluticasone 50 MCG/ACT nasal spray ?Commonly known as: FLONASE ?Place 2 sprays into both nostrils daily. ?  ?MAGNESIUM PO ?Take 1 tablet by mouth daily. ?  ?meclizine 25 MG tablet ?Commonly known as: ANTIVERT ?Take 1 tablet (25 mg total) by mouth 3 (three) times daily as needed for dizziness. ?  ?ondansetron 4 MG disintegrating tablet ?Commonly known as: ZOFRAN-ODT ?Take 1 tablet (4 mg total) by mouth every 8 (eight) hours as needed for nausea or vomiting. ?  ?pantoprazole 40 MG tablet ?Commonly known as: PROTONIX ?Take 1 tablet (40 mg total) by mouth daily. ?  ?venlafaxine XR 75 MG 24 hr capsule ?Commonly known as: EFFEXOR-XR ?Take 1 capsule (75 mg total) by mouth daily with breakfast. ?  ?VITAMIN B-12 PO ?Take 1 tablet by mouth daily. ?  ?VITAMIN D3 PO ?Take 1 capsule by mouth daily. ?  ? ?  ? ? Follow-up Information   ? ? [facility]. Schedule an appointment as soon as possible for a visit in 1 week(s).   ?Specialty: Rehabilitation ?Contact information: ?[address], [place] [address] ?[phone] mc ?[place] ?[phone] ? ?  ?  ? ? Maximiano Coss, NP. Schedule an appointment as soon as possible for a visit in 1 week(s).   ?Specialty: Adult Health Nurse Practitioner ?Contact information: ?[address] ?[place] [address] ?[phone] ? ? ?  ?  ? ?  ?  ? ?  ? ?Allergies  ?Allergen Reactions  ? Sulfa Antibiotics Swelling  ? ? ?Consultations: ?Neurology, Dr. Rory Percy, Dr. Jimmy Picket ?Psychiatry, Dr. Dwyane Dee ? ? ?Procedures/Studies: ?CT ANGIO HEAD NECK W WO CM ? ?Result Date: 01/29/2022 ?CLINICAL DATA:  Neuro deficit, acute stroke suspected. Severe headache. Slurred speech EXAM: CT ANGIOGRAPHY HEAD AND NECK TECHNIQUE: Multidetector CT imaging of the head and neck was performed using the standard protocol during bolus administration of intravenous contrast. Multiplanar CT image reconstructions and MIPs were obtained to evaluate the vascular anatomy. Carotid stenosis measurements (when applicable) are obtained utilizing NASCET criteria, using the distal internal carotid diameter as the denominator. RADIATION DOSE REDUCTION: This exam was performed according to the departmental dose-optimization program which includes automated exposure control, adjustment of the mA and/or kV according to patient size and/or use of iterative reconstruction technique. CONTRAST:  118m OMNIPAQUE IOHEXOL 350 MG/ML SOLN COMPARISON:  CT head without contrast 01/29/2022 FINDINGS: CTA NECK FINDINGS Aortic arch: A 3 vessel arch configuration is present. No significant vascular calcifications are present. No stenosis or  aneurysm present. Right carotid system: Right common carotid artery is within normal limits. Bifurcation is unremarkable. Mild tortuosity is present cervical right ICA without significant stenosis. Left carotid system: The left common carotid artery is within normal limits. Bifurcation is unremarkable. Mild tortuosity is present cervical left ICA without significant stenosis. Vertebral arteries: The vertebral arteries are codominant. Both vertebral arteries originate from the subclavians without significant stenosis. No significant stenosis or vascular injury is present to either vertebral artery in the neck. Skeleton:  Vertebral body heights and alignment are normal. No focal osseous lesions are present. Other neck: Soft tissues the neck are otherwise unremarkable. Salivary glands are within normal limits. Thyroid is normal. No s

## 2022-01-31 NOTE — TOC Transition Note (Signed)
Transition of Care (TOC) - CM/SW Discharge Note ? ? ?Patient Details  ?Name: Megan Roach ?MRN: [id number] ?Date of Birth: Sep 13, 1976 ? ?Transition of Care (TOC) CM/SW Contact:  ?Pollie Friar, RN ?Phone Number: ?01/31/2022, 11:46 AM ? ? ?Clinical Narrative:    ?Patient discharging home with outpatient therapy through [facility] outpatient therapy. Information on the AVS.  ?Pts spouse is able to provide needed transportation.  ?No DME needs. ?Spouse will transport home today. ? ? ?Final next level of care: OP Rehab ?Barriers to Discharge: No Barriers Identified ? ? ?Patient Goals and CMS Choice ?  ?  ?Choice offered to / list presented to : Patient, Spouse ? ?Discharge Placement ?  ?           ?  ?  ?  ?  ? ?Discharge Plan and Services ?  ?  ?           ?  ?  ?  ?  ?  ?  ?  ?  ?  ?  ? ?Social Determinants of Health (SDOH) Interventions ?  ? ? ?Readmission Risk Interventions ?   ? View : No data to display.  ?  ?  ?  ? ? ? ? ? ?

## 2022-02-01 ENCOUNTER — Ambulatory Visit: Payer: BC Managed Care – PPO

## 2022-02-01 LAB — CERULOPLASMIN: Ceruloplasmin: 29.8 mg/dL (ref 19.0–39.0)

## 2022-02-01 LAB — MISC LABCORP TEST (SEND OUT): Labcorp test code: 505310

## 2022-02-02 LAB — NEUROMYELITIS OPTICA AUTOAB, IGG: NMO-IgG: <1.5 U/mL (ref 0.0–3.0)

## 2022-02-02 LAB — VITAMIN E
Vitamin E (Alpha Tocopherol): 10.6 mg/L (ref 7.0–25.1)
Vitamin E(Gamma Tocopherol): 1.2 mg/L (ref 0.5–5.5)

## 2022-02-03 LAB — METHYLMALONIC ACID, SERUM: Methylmalonic Acid, Quantitative: 91 nmol/L (ref 0–378)

## 2022-03-11 ENCOUNTER — Ambulatory Visit (INDEPENDENT_AMBULATORY_CARE_PROVIDER_SITE_OTHER): Payer: BC Managed Care – PPO | Admitting: Psychiatry

## 2022-03-11 ENCOUNTER — Encounter: Payer: Self-pay | Admitting: Psychiatry

## 2022-03-11 VITALS — BP 122/78 | HR 84 | Ht 59.0 in | Wt 159.4 lb

## 2022-03-11 DIAGNOSIS — G43101 Migraine with aura, not intractable, with status migrainosus: Secondary | ICD-10-CM | POA: Diagnosis not present

## 2022-03-11 DIAGNOSIS — M542 Cervicalgia: Secondary | ICD-10-CM

## 2022-03-11 MED ORDER — RIZATRIPTAN BENZOATE 5 MG PO TABS: 5.0000 mg | ORAL_TABLET | ORAL | 6 refills | Status: DC | PRN

## 2022-03-11 MED ORDER — PROPRANOLOL HCL 20 MG PO TABS: 20.0000 mg | ORAL_TABLET | Freq: Two times a day (BID) | ORAL | 6 refills | Status: DC

## 2022-03-11 NOTE — Patient Instructions (Signed)
Start propranolol 20 mg twice a day for headache prevention Start rizatriptan as needed for migraine. Take at the onset of migraine. If headache recurs or does not fully resolve, you may take a second dose after 2 hours. Please avoid taking more than 2 days per week Referral to physical therapy

## 2022-03-11 NOTE — Progress Notes (Signed)
Referring:  [place] Eric J, DO [place] [employer],  [place] [address]  PCP: Derinda Late, MD  Neurology was asked to evaluate Megan Roach, a 46 year old female for a chief complaint of headaches.  Our recommendations of care will be communicated by shared medical record.    CC:  headaches  History provided from self  HPI:  Medical co-morbidities: asthma  The patient presents for evaluation of headaches. On 01/29/22 she woke up at 4 AM with a sudden severe headache. She took advil and went back to sleep. The next morning she had blurred vision in one eye (lasts for a few minutes then slowly returns), slurred speech, left facial numbness, and her whole body felt weak. She presented to the ED where MRI brain, CTV, and CTA head/neck were unremarkable.  MRI C spine and T spine without contrast showed cord signal abnormalities at C7-T1 and T2-3. Repeat imaging with contrast did not reveal any enhancing lesions or expansion of the spinal cord. B12, folate, Mog, NMO, copper, ceruloplasmin, vitamin E, and HIV were wnl. 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  She continues to have a daily headache, though none as severe as her initial headache. She recently hit her head on the steering wheel of her car recently which has led to a slight worsening of her headache. She has been taking Advil and Benadryl as needed. She takes it 2-3 days per week. Has taken a few Tramadol as well.   She denies a history of migraines, but does note that he had sinus headaches as a child. She also had vertigo as a child.  She has also noted mood swings recently. She is planning to see her OB/gyn to see if she may be perimenopausal.  Headache History: Onset: January 29, 2022 Triggers: wakes up with headaches Aura: no Location: unilateral or holocephalic Quality/Description: pressure, throbbing,  sharp Associated Symptoms:  Photophobia: yes  Phonophobia: yes  Nausea: yes Worse with activity?: yes Duration of headaches:  Headache days per month: 30 Headache free days per month: 0  Current Treatment: Abortive Advil Benadryl  Preventative none  Prior Therapies                                 Effexor 75 mg daily   LABS: CBC    Component Value Date/Time   WBC 6.2 01/29/2022 1021   RBC 4.44 01/29/2022 1021   HGB 12.9 01/29/2022 1103   HCT 38.0 01/29/2022 1103   PLT 307 01/29/2022 1021   MCV 89.2 01/29/2022 1021   MCH 30.0 01/29/2022 1021   MCHC 33.6 01/29/2022 1021   RDW 13.4 01/29/2022 1021   LYMPHSABS 1.7 01/29/2022 1021   MONOABS 0.4 01/29/2022 1021   EOSABS 0.1 01/29/2022 1021   BASOSABS 0.0 01/29/2022 1021      Latest Ref Rng & Units 01/29/2022   11:03 AM 01/29/2022   10:21 AM 12/03/2021    9:35 AM  CMP  Glucose 70 - 99 mg/dL 104   107   83    BUN 6 - 20 mg/dL 14   13   18     Creatinine 0.44 - 1.00 mg/dL 0.80   0.85   0.81    Sodium 135 - 145 mmol/L 141   139   136    Potassium 3.5 - 5.1 mmol/L 4.2  4.1   3.9    Chloride 98 - 111 mmol/L 104   108   102    CO2 22 - 32 mmol/L  25   29    Calcium 8.9 - 10.3 mg/dL  9.1   9.2    Total Protein 6.5 - 8.1 g/dL  6.8   6.8    Total Bilirubin 0.3 - 1.2 mg/dL  0.6   0.6    Alkaline Phos 38 - 126 U/L  51   51    AST 15 - 41 U/L  20   20    ALT 0 - 44 U/L  19   30      01/30/22: B12, folate, Mog, NMO, copper, ceruloplasmin, vitamin E, and HIV wnl  IMAGING:  CTA head/neck 01/29/22: 1. Mild tortuosity of the cervical internal carotid arteries bilaterally without significant stenosis. This is nonspecific, but most likely related to hypertension. 2. Otherwise normal CTA of the neck. 3. Normal CTA Circle of Willis without significant proximal stenosis, aneurysm, or branch vessel occlusion.   CTV 01/29/22: unremarkable  MRI brain 01/29/22: unremarkable  MRI C/T spine without contrast 01/29/22: 1. Subtle foci  of cord signal abnormality at the levels of C7-T1 and T2-3, nonspecific, but suspicious for possible demyelinating disease/multiple sclerosis. There is question of subtle associated cord expansion about the lesion at C7-T1, which could reflect active demyelination. Correlation with postcontrast imaging suggested. 2. Mild noncompressive disc bulging at C5-6 and C6-7 without stenosis. 3. Small layering bilateral pleural effusions.  MRI C/T spine with contrast 01/30/22: No abnormal spinal cord enhancement at the C7-T1 cord lesion or elsewhere. No abnormal thoracic spinal cord enhancement.  Imaging independently reviewed on Mar 11, 2022   Current Outpatient Medications on File Prior to Visit  Medication Sig Dispense Refill   Cholecalciferol (VITAMIN D3 PO) Take 1 capsule by mouth daily.     Cyanocobalamin (VITAMIN B-12 PO) Take 1 tablet by mouth daily.     MAGNESIUM PO Take 1 tablet by mouth daily.     meclizine (ANTIVERT) 25 MG tablet Take 1 tablet (25 mg total) by mouth 3 (three) times daily as needed for dizziness. 30 tablet 0   ondansetron (ZOFRAN-ODT) 4 MG disintegrating tablet Take 1 tablet (4 mg total) by mouth every 8 (eight) hours as needed for nausea or vomiting. 20 tablet 0   No current facility-administered medications on file prior to visit.     Allergies: Allergies  Allergen Reactions   Sulfa Antibiotics Swelling    Family History: Migraine or other headaches in the family:  daughter has migraines Aneurysms in a first degree relative:  no Brain tumors in the family:  no Other neurological illness in the family:   no  Past Medical History: Past Medical History:  Diagnosis Date   Asthma     Past Surgical History Past Surgical History:  Procedure Laterality Date   CESAREAN SECTION     total of 3   RECTAL PROLAPSE REPAIR     TOTAL VAGINAL HYSTERECTOMY      Social History: Social History   Tobacco Use   Smoking status: Never   Smokeless tobacco: Never   Vaping Use   Vaping Use: Never used  Substance Use Topics   Alcohol use: Yes    Comment: socially   Drug use: Never    ROS: Negative for fevers, chills. Positive for headaches. All other systems reviewed and negative unless stated otherwise in HPI.   Physical Exam:   Vital Signs:  BP 122/78   Pulse 84   Ht 4' 11"  (1.499 m)   Wt 159 lb 6.4 oz (72.3 kg)   BMI 32.19 kg/m  GENERAL: well appearing,in no acute distress,alert SKIN:  Color, texture, turgor normal. No rashes or lesions HEAD:  Normocephalic/atraumatic. CV:  RRR RESP: Normal respiratory effort MSK: +tenderness to palpation over L>R occiput  NEUROLOGICAL: Mental Status: Alert, oriented to person, place and time,Follows commands Cranial Nerves: PERRL, visual fields intact to confrontation, extraocular movements intact, facial sensation intact, no facial droop or ptosis, hearing grossly intact, no dysarthria Motor: muscle strength 5/5 both upper and lower extremities Reflexes: 2+ throughout Sensation: decreased sensation to light touch over left hand (states this is baseline for her) and left lower extremity Coordination: Finger-to- nose-finger intact bilaterally,Heel-to-shin intact bilaterally Gait: normal-based   IMPRESSION: 46 year old female with a history of asthma who presents for evaluation of headaches which began in April 2023. MRI, CTV, and CTA of the brain were unremarkable. MRI C/T spine with signal changes at C7-T1 and T2-3 which suggest a remote process (possibly remote transverse myelitis), but with no evidence of active inflammation. Metabolic/nutritional workup for transverse myelitis was negative. Her current symptoms are most consistent with migraine with aura. Denies history of migraines, though it's possible the sinus headaches she had as a child were actually migraine headaches. Discussed how migraines can be worse during perimenopause. Will start propranolol for headache prevention and Maxalt for  rescue. Her exam is also suggestive of L>R occipital neuralgia. Referral to neck PT placed.  PLAN: -Prevention: Start propranolol 20 mg BID -Rescue: Start Maxalt 5 mg PRN -Referral to neck PT placed  I spent a total of 64 minutes chart reviewing and counseling the patient. Headache education was done. Discussed treatment options including preventive and acute medications, and physical therapy. Discussed medication overuse headache and to limit use of acute treatments to no more than 2 days/week or 10 days/month. Discussed medication side effects, adverse reactions and drug interactions. Written educational materials and patient instructions outlining all of the above were given.  Follow-up: 5 months   Genia Harold, MD 03/11/2022   12:23 PM

## 2022-03-15 ENCOUNTER — Telehealth: Payer: Self-pay | Admitting: Psychiatry

## 2022-03-15 NOTE — Telephone Encounter (Signed)
Referral for Physical Therapy sent to [facility] [phone].

## 2022-04-11 ENCOUNTER — Ambulatory Visit: Payer: BC Managed Care – PPO | Attending: Psychiatry

## 2022-04-18 ENCOUNTER — Telehealth: Payer: Self-pay | Admitting: Psychiatry

## 2022-04-18 NOTE — Telephone Encounter (Signed)
Received call from [facility] at Dr. Lawernce Ion office-stated patient recently came into their office for a visit and is requesting a transfer of care to Dr. Jaynee Eagles for further treatment. Per patient "not comfortable with Dr. Billey Gosling" No specific reason why, I let her know that I could put in a request for a switch, but we do not do second opinions in office. Could you both review and advise if this is appropriate? Thank you

## 2022-04-20 NOTE — Telephone Encounter (Signed)
I don't mind either way

## 2022-06-17 ENCOUNTER — Ambulatory Visit (HOSPITAL_BASED_OUTPATIENT_CLINIC_OR_DEPARTMENT_OTHER): Payer: BC Managed Care – PPO | Admitting: Obstetrics & Gynecology

## 2022-07-08 ENCOUNTER — Encounter: Payer: Self-pay | Admitting: Psychiatry

## 2022-07-08 ENCOUNTER — Ambulatory Visit (INDEPENDENT_AMBULATORY_CARE_PROVIDER_SITE_OTHER): Payer: BC Managed Care – PPO | Admitting: Psychiatry

## 2022-07-08 VITALS — BP 108/63 | HR 82 | Temp 98.7°F | Wt 160.2 lb

## 2022-07-08 DIAGNOSIS — K623 Rectal prolapse: Secondary | ICD-10-CM | POA: Insufficient documentation

## 2022-07-08 DIAGNOSIS — F419 Anxiety disorder, unspecified: Secondary | ICD-10-CM | POA: Diagnosis not present

## 2022-07-08 DIAGNOSIS — J45909 Unspecified asthma, uncomplicated: Secondary | ICD-10-CM | POA: Insufficient documentation

## 2022-07-08 DIAGNOSIS — D259 Leiomyoma of uterus, unspecified: Secondary | ICD-10-CM | POA: Insufficient documentation

## 2022-07-08 DIAGNOSIS — Z634 Disappearance and death of family member: Secondary | ICD-10-CM

## 2022-07-08 DIAGNOSIS — F32A Depression, unspecified: Secondary | ICD-10-CM | POA: Insufficient documentation

## 2022-07-08 MED ORDER — ESCITALOPRAM OXALATE 5 MG PO TABS: 5.0000 mg | ORAL_TABLET | Freq: Every day | ORAL | 1 refills | Status: DC
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
## 2022-07-08 NOTE — Patient Instructions (Addendum)
[email or web address]  www.psychologytoday   [facility] - [place] [phone]  [facility] - [phone]    Insomnia Insomnia is a sleep disorder that makes it difficult to fall asleep or stay asleep. Insomnia can cause fatigue, low energy, difficulty concentrating, mood swings, and poor performance at work or school. There are three different ways to classify insomnia: Difficulty falling asleep. Difficulty staying asleep. Waking up too early in the morning. Any type of insomnia can be long-term (chronic) or short-term (acute). Both are common. Short-term insomnia usually lasts for 3 months or less. Chronic insomnia occurs at least three times a week for longer than 3 months. What are the causes? Insomnia may be caused by another condition, situation, or substance, such as: Having certain mental health conditions, such as anxiety and depression. Using caffeine, alcohol, tobacco, or drugs. Having gastrointestinal conditions, such as gastroesophageal reflux disease (GERD). Having certain medical conditions. These include: Asthma. Alzheimer's disease. Stroke. Chronic pain. An overactive thyroid gland (hyperthyroidism). Other sleep disorders, such as restless legs syndrome and sleep apnea. Menopause. Sometimes, the cause of insomnia may not be known. What increases the risk? Risk factors for insomnia include: Gender. Females are affected more often than males. Age. Insomnia is more common as people get older. Stress and certain medical and mental health conditions. Lack of exercise. Having an irregular work schedule. This may include working night shifts and traveling between different time zones. What are the signs or symptoms? If you have insomnia, the main symptom is having trouble falling asleep or having trouble staying asleep. This may lead to other symptoms, such as: Feeling tired or having low energy. Feeling  nervous about going to sleep. Not feeling rested in the morning. Having trouble concentrating. Feeling irritable, anxious, or depressed. How is this diagnosed? This condition may be diagnosed based on: Your symptoms and medical history. Your health care provider may ask about: Your sleep habits. Any medical conditions you have. Your mental health. A physical exam. How is this treated? Treatment for insomnia depends on the cause. Treatment may focus on treating an underlying condition that is causing the insomnia. Treatment may also include: Medicines to help you sleep. Counseling or therapy. Lifestyle adjustments to help you sleep better. Follow these instructions at home: Eating and drinking  Limit or avoid alcohol, caffeinated beverages, and products that contain nicotine and tobacco, especially close to bedtime. These can disrupt your sleep. Do not eat a large meal or eat spicy foods right before bedtime. This can lead to digestive discomfort that can make it hard for you to sleep. Sleep habits  Keep a sleep diary to help you and your health care provider figure out what could be causing your insomnia. Write down: When you sleep. When you wake up during the night. How well you sleep and how rested you feel the next day. Any side effects of medicines you are taking. What you eat and drink. Make your bedroom a dark, comfortable place where it is easy to fall asleep. Put up shades or blackout curtains to block light from outside. Use a white noise machine to block noise. Keep the temperature cool. Limit screen use before bedtime. This includes: Not watching TV. Not using your smartphone, tablet, or computer. Stick to a routine that includes going to bed and waking up at the same times every day and night. This can help you fall asleep faster. Consider making a quiet activity, such  as reading, part of your nighttime routine. Try to avoid taking naps during the day so that you  sleep better at night. Get out of bed if you are still awake after 15 minutes of trying to sleep. Keep the lights down, but try reading or doing a quiet activity. When you feel sleepy, go back to bed. General instructions Take over-the-counter and prescription medicines only as told by your health care provider. Exercise regularly as told by your health care provider. However, avoid exercising in the hours right before bedtime. Use relaxation techniques to manage stress. Ask your health care provider to suggest some techniques that may work well for you. These may include: Breathing exercises. Routines to release muscle tension. Visualizing peaceful scenes. Make sure that you drive carefully. Do not drive if you feel very sleepy. Keep all follow-up visits. This is important. Contact a health care provider if: You are tired throughout the day. You have trouble in your daily routine due to sleepiness. You continue to have sleep problems, or your sleep problems get worse. Get help right away if: You have thoughts about hurting yourself or someone else. Get help right away if you feel like you may hurt yourself or others, or have thoughts about taking your own life. Go to your nearest emergency room or: Call 911. Call the [facility] at [phone] or 988. This is open 24 hours a day. Text the Crisis Text Line at 212-378-4336. Summary Insomnia is a sleep disorder that makes it difficult to fall asleep or stay asleep. Insomnia can be long-term (chronic) or short-term (acute). Treatment for insomnia depends on the cause. Treatment may focus on treating an underlying condition that is causing the insomnia. Keep a sleep diary to help you and your health care provider figure out what could be causing your insomnia. This information is not intended to replace advice given to you by your health care provider. Make sure you discuss any questions you have with your health care  provider. Document Revised: 09/13/2021 Document Reviewed: 09/13/2021 Elsevier Patient Education  [place]. Escitalopram Tablets What is this medication? ESCITALOPRAM (es sye TAL oh pram) treats depression and anxiety. It increases the amount of serotonin in the brain, a hormone that helps regulate mood. It belongs to a group of medications called SSRIs. This medicine may be used for other purposes; ask your health care provider or pharmacist if you have questions. COMMON BRAND NAME(S): Lexapro What should I tell my care team before I take this medication? They need to know if you have any of these conditions: Bipolar disorder or a family history of bipolar disorder Diabetes Glaucoma Heart disease Kidney or liver disease Receiving electroconvulsive therapy Seizures Suicidal thoughts, plans, or attempt by you or a family member An unusual or allergic reaction to escitalopram, the related medication citalopram, other medications, foods, dyes, or preservatives Pregnant or trying to become pregnant Breast-feeding How should I use this medication? Take this medication by mouth with a glass of water. Follow the directions on the prescription label. You can take it with or without food. If it upsets your stomach, take it with food. Take your medication at regular intervals. Do not take it more often than directed. Do not stop taking this medication suddenly except upon the advice of your care team. Stopping this medication too quickly may cause serious side effects or your condition may worsen. A special MedGuide will be given to you by the pharmacist with each prescription and refill. Be  sure to read this information carefully each time. Talk to your care team regarding the use of this medication in children. Special care may be needed. Overdosage: If you think you have taken too much of this medicine contact a poison control center or emergency room at once. NOTE: This medicine is only  for you. Do not share this medicine with others. What if I miss a dose? If you miss a dose, take it as soon as you can. If it is almost time for your next dose, take only that dose. Do not take double or extra doses. What may interact with this medication? Do not take this medication with any of the following: Certain medications for fungal infections like fluconazole, itraconazole, ketoconazole, posaconazole, voriconazole Cisapride Citalopram Dronedarone Linezolid MAOIs like Carbex, Eldepryl, Marplan, Nardil, and Parnate Methylene blue (injected into a vein) Pimozide Thioridazine This medication may also interact with the following: Alcohol Amphetamines Aspirin and aspirin-like medications Carbamazepine Certain medications for depression, anxiety, or psychotic disturbances Certain medications for migraine headache like almotriptan, eletriptan, frovatriptan, naratriptan, rizatriptan, sumatriptan, zolmitriptan Certain medications for sleep Certain medications that treat or prevent blood clots like warfarin, enoxaparin, dalteparin Cimetidine Diuretics Dofetilide Fentanyl Furazolidone Isoniazid Lithium Metoprolol NSAIDs, medications for pain and inflammation, like ibuprofen or naproxen Other medications that prolong the QT interval (cause an abnormal heart rhythm) Procarbazine Rasagiline Supplements like St. John's wort, kava kava, valerian Tramadol Tryptophan Ziprasidone This list may not describe all possible interactions. Give your health care provider a list of all the medicines, herbs, non-prescription drugs, or dietary supplements you use. Also tell them if you smoke, drink alcohol, or use illegal drugs. Some items may interact with your medicine. What should I watch for while using this medication? Tell your care team if your symptoms do not get better or if they get worse. Visit your care team for regular checks on your progress. Because it may take several weeks to see  the full effects of this medication, it is important to continue your treatment as prescribed by your care team. Watch for new or worsening thoughts of suicide or depression. This includes sudden changes in mood, behaviors, or thoughts. These changes can happen at any time but are more common in the beginning of treatment or after a change in dose. Call your care team right away if you experience these thoughts or worsening depression. Manic episodes may happen in patients with bipolar disorder who take this medication. Watch for changes in feelings or behaviors such as feeling anxious, nervous, agitated, panicky, irritable, hostile, aggressive, impulsive, severely restless, overly excited and hyperactive, or trouble sleeping. These symptoms can happen at any time but are more common in the beginning of treatment or after a change in dose. Call your care team right away if you notice any of these symptoms. You may get drowsy or dizzy. Do not drive, use machinery, or do anything that needs mental alertness until you know how this medication affects you. Do not stand or sit up quickly, especially if you are an older patient. This reduces the risk of dizzy or fainting spells. Alcohol may interfere with the effect of this medication. Avoid alcoholic drinks. Your mouth may get dry. Chewing sugarless gum or sucking hard candy, and drinking plenty of water may help. Contact your care team if the problem does not go away or is severe. What side effects may I notice from receiving this medication? Side effects that you should report to your care team as soon as possible:  Allergic reactions--skin rash, itching, hives, swelling of the face, lips, tongue, or throat Bleeding--bloody or black, tar-like stools, red or dark brown urine, vomiting blood or brown material that looks like coffee grounds, small, red or purple spots on skin, unusual bleeding or bruising Heart rhythm changes--fast or irregular heartbeat,  dizziness, feeling faint or lightheaded, chest pain, trouble breathing Low sodium level--muscle weakness, fatigue, dizziness, headache, confusion Serotonin syndrome--irritability, confusion, fast or irregular heartbeat, muscle stiffness, twitching muscles, sweating, high fever, seizure, chills, vomiting, diarrhea Sudden eye pain or change in vision such as blurry vision, seeing halos around lights, vision loss Thoughts of suicide or self-harm, worsening mood, feelings of depression Side effects that usually do not require medical attention (report to your care team if they continue or are bothersome): Change in sex drive or performance Diarrhea Excessive sweating Nausea Tremors or shaking Upset stomach This list may not describe all possible side effects. Call your doctor for medical advice about side effects. You may report side effects to FDA at 1-800-FDA-1088. Where should I keep my medication? Keep out of reach of children and pets. Store at room temperature between 15 and 30 degrees C (59 and 86 degrees F). Throw away any unused medication after the expiration date. NOTE: This sheet is a summary. It may not cover all possible information. If you have questions about this medicine, talk to your doctor, pharmacist, or health care provider.  2023 Elsevier/Gold Standard (2020-09-21 00:00:00)

## 2022-07-13 ENCOUNTER — Encounter: Payer: Self-pay | Admitting: Podiatry

## 2022-07-13 ENCOUNTER — Ambulatory Visit (INDEPENDENT_AMBULATORY_CARE_PROVIDER_SITE_OTHER): Payer: BC Managed Care – PPO

## 2022-07-13 ENCOUNTER — Ambulatory Visit: Payer: BC Managed Care – PPO | Admitting: Podiatry

## 2022-07-13 DIAGNOSIS — M779 Enthesopathy, unspecified: Secondary | ICD-10-CM | POA: Diagnosis not present

## 2022-07-13 DIAGNOSIS — M7751 Other enthesopathy of right foot: Secondary | ICD-10-CM

## 2022-07-13 MED ORDER — DICLOFENAC SODIUM 75 MG PO TBEC: 75.0000 mg | DELAYED_RELEASE_TABLET | Freq: Two times a day (BID) | ORAL | 2 refills | Status: DC

## 2022-07-13 MED ORDER — TRIAMCINOLONE ACETONIDE 10 MG/ML IJ SUSP
10.0000 mg | Freq: Once | INTRAMUSCULAR | Status: AC
  Administered 2022-07-13: 10 mg

## 2022-07-14 NOTE — Progress Notes (Signed)
Subjective:   Patient ID: [name], female   DOB: 46 y.o.   MRN: [id number]   HPI Patient presents stating she has had a lot of pain in the ball of her right foot that is been going on for a number of months and its been a constant soreness aching and throbbing and has intensified over that time.  She is tried elevation and ice gets occasional numbness and tingling and patient does not smoke likes to be active   Review of Systems  All other systems reviewed and are negative.       Objective:  Physical Exam Vitals and nursing note reviewed.  Constitutional:      Appearance: She is well-developed.  Pulmonary:     Effort: Pulmonary effort is normal.  Musculoskeletal:        General: Normal range of motion.  Skin:    General: Skin is warm.  Neurological:     Mental Status: She is alert.     Neurovascular status intact with patient found to have inflammation and fluid around the second metatarsal phalangeal joint right that is painful when pressed and makes walking difficult.  Mild into the third not to the same degree and patient is found to have good digital perfusion well oriented x3     Assessment:  Acute inflammatory capsulitis most likely second MPJ right mild into the third cannot rule out arthritis or stress fracture     Plan:  H&P x-ray reviewed with patient.  I anesthetized the right forefoot with 60 mg Xylocaine Marcaine mixture sterile prep done and I then aspirated the second MPJ getting amount of clear fluid and injected with quarter cc dexamethasone Kenalog applied thick pad to take pressure off the area and advised on rigid bottom shoes.  Reappoint to recheck  X-rays indicate that there is no signs of arthritis or stress fracture surrounding the joint surface

## 2022-07-28 ENCOUNTER — Ambulatory Visit: Payer: BC Managed Care – PPO | Admitting: Podiatry

## 2022-08-01 ENCOUNTER — Other Ambulatory Visit: Payer: Self-pay | Admitting: Psychiatry

## 2022-08-01 DIAGNOSIS — F419 Anxiety disorder, unspecified: Secondary | ICD-10-CM

## 2022-08-01 DIAGNOSIS — F32A Depression, unspecified: Secondary | ICD-10-CM

## 2022-08-08 ENCOUNTER — Encounter (INDEPENDENT_AMBULATORY_CARE_PROVIDER_SITE_OTHER): Payer: BC Managed Care – PPO | Admitting: Podiatry

## 2022-08-08 NOTE — Progress Notes (Signed)
No Show This encounter was created in error - please disregard. 

## 2022-08-15 ENCOUNTER — Ambulatory Visit: Payer: BC Managed Care – PPO | Admitting: Psychiatry

## 2022-08-22 ENCOUNTER — Telehealth (INDEPENDENT_AMBULATORY_CARE_PROVIDER_SITE_OTHER): Payer: BC Managed Care – PPO | Admitting: Psychiatry

## 2022-08-22 ENCOUNTER — Encounter: Payer: Self-pay | Admitting: Psychiatry

## 2022-08-22 DIAGNOSIS — F330 Major depressive disorder, recurrent, mild: Secondary | ICD-10-CM

## 2022-08-22 DIAGNOSIS — Z634 Disappearance and death of family member: Secondary | ICD-10-CM | POA: Diagnosis not present

## 2022-08-22 DIAGNOSIS — F418 Other specified anxiety disorders: Secondary | ICD-10-CM | POA: Diagnosis not present

## 2022-08-22 MED ORDER — BUPROPION HCL 75 MG PO TABS: 75.0000 mg | ORAL_TABLET | Freq: Every day | ORAL | 1 refills | Status: DC

## 2022-08-22 NOTE — Progress Notes (Unsigned)
Virtual Visit via Telephone Note  I connected with Megan Roach on 08/23/22 at  2:00 PM EST by telephone and verified that I am speaking with the correct person using two identifiers.  Location Provider Location : [facility] Patient Location : Home  Participants: Patient , Provider    I discussed the limitations, risks, security and privacy concerns of performing an evaluation and management service by telephone and the availability of in person appointments. I also discussed with the patient that there may be a patient responsible charge related to this service. The patient expressed understanding and agreed to proceed.   I discussed the assessment and treatment plan with the patient. The patient was provided an opportunity to ask questions and all were answered. The patient agreed with the plan and demonstrated an understanding of the instructions.   The patient was advised to call back or seek an in-person evaluation if the symptoms worsen or if the condition fails to improve as anticipated.   [place] MD OP Progress Note  08/22/2022 2:25 PM Megan Roach  MRN:  [id number]  Chief Complaint:  Chief Complaint  Patient presents with   Follow-up   Anxiety   Depression   HPI: Megan Roach is a [place] female, married, stay-at-home mom, lives in [place], has a history of anxiety disorder, depression, gastroesophageal reflux disease, history of thyroid disease, history of endometriosis status post hysterectomy, migraine headaches, carpal tunnel syndrome, perimenopausal symptoms was evaluated by phone today.  Patient contacted the clinic reporting upper respiratory tract infection/flulike symptoms and hence wanted to do a phone call today.  Reports although the Lexapro has helped with her irritability she continues to struggle with low energy problems, concentration problems.  She also reports sleep is restless.  She has night sweats.  She reports she goes to bed however sleep  is interrupted after a couple of hours.  Likely that also affecting energy during the day.  Currently not taking any sleep medications.  Reports Lexapro likely causing sexual side effects.  Patient reports her foot pain is better, she is currently following up with her providers.  She was able to schedule an appointment with a therapist, has first visit coming up soon.  Looks forward to that.  Patient denies any suicidality, homicidality or perceptual disturbances.  Patient denies any other concerns today.  Visit Diagnosis:    ICD-10-CM   1. Other specified anxiety disorders  F41.8    with limited symptom attacks    2. Mild episode of recurrent major depressive disorder (HCC)  F33.0 buPROPion (WELLBUTRIN) 75 MG tablet    3. Bereavement  Z63.4       Past Psychiatric History: Reviewed past psych History from progress note on 07/08/2022.  Past trials of venlafaxine.  Past Medical History:  Past Medical History:  Diagnosis Date   Asthma     Past Surgical History:  Procedure Laterality Date   CESAREAN SECTION     total of 3   RECTAL PROLAPSE REPAIR     TOTAL VAGINAL HYSTERECTOMY      Family Psychiatric History: Reviewed family psychiatric history from progress note on 07/08/2022.  Family History:  Family History  Problem Relation Age of Onset   Depression Mother    Asthma Mother    Bladder Cancer Father    Idiopathic pulmonary fibrosis Father    Alcohol abuse Brother    Gallbladder disease Brother    Liver disease Brother    Cervical cancer Other    Anxiety disorder Son  ADD / ADHD Son     Social History: Reviewed social history from progress note on 07/08/2022. Social History   Socioeconomic History   Marital status: Married    Spouse name: sugith   Number of children: 3   Years of education: Not on file   Highest education level: Professional school degree (e.g., MD, DDS, DVM, JD)  Occupational History   Not on file  Tobacco Use   Smoking status: Never    Smokeless tobacco: Never  Vaping Use   Vaping Use: Never used  Substance and Sexual Activity   Alcohol use: Yes    Alcohol/week: 1.0 standard drink of alcohol    Types: 1 Glasses of wine per week    Comment: socially   Drug use: Never   Sexual activity: Yes  Other Topics Concern   Not on file  Social History Narrative   Lives at home with family   R handed   Caffeine: 2 C of coffee a day   Social Determinants of Health   Financial Resource Strain: Not on file  Food Insecurity: Not on file  Transportation Needs: Not on file  Physical Activity: Not on file  Stress: Not on file  Social Connections: Not on file    Allergies:  Allergies  Allergen Reactions   Sulfa Antibiotics Swelling    Metabolic Disorder Labs: Lab Results  Component Value Date   HGBA1C 5.4 12/03/2021   No results found for: "PROLACTIN" Lab Results  Component Value Date   CHOL 195 01/30/2022   TRIG 54 01/30/2022   HDL 64 01/30/2022   CHOLHDL 3.0 01/30/2022   VLDL 11 01/30/2022   LDLCALC 120 (H) 01/30/2022   LDLCALC 119 (H) 12/03/2021   Lab Results  Component Value Date   TSH 1.98 12/03/2021    Therapeutic Level Labs: No results found for: "LITHIUM" No results found for: "VALPROATE" No results found for: "CBMZ"  Current Medications: Current Outpatient Medications  Medication Sig Dispense Refill   buPROPion (WELLBUTRIN) 75 MG tablet Take 1 tablet (75 mg total) by mouth daily with breakfast. 30 tablet 1   Cholecalciferol (VITAMIN D3 PO) Take 1 capsule by mouth daily.     clonazePAM (KLONOPIN) 0.5 MG tablet Take by mouth.     diclofenac (VOLTAREN) 75 MG EC tablet Take 1 tablet (75 mg total) by mouth 2 (two) times daily. 50 tablet 2   escitalopram (LEXAPRO) 5 MG tablet TAKE 1 TABLET (5 MG TOTAL) BY MOUTH DAILY WITH SUPPER. 90 tablet 0   estradiol (MINIVELLE) 0.05 MG/24HR patch Apply 1 patch twice a week by transdermal route.     ibuprofen (ADVIL) 400 MG tablet Take 400 mg by mouth every 6  (six) hours as needed.     meloxicam (MOBIC) 15 MG tablet Take 15 mg by mouth daily as needed.     rizatriptan (MAXALT) 5 MG tablet Take 1 tablet (5 mg total) by mouth as needed for migraine. May repeat in 2 hours if needed 10 tablet 6   No current facility-administered medications for this visit.     Musculoskeletal: Strength & Muscle Tone:  UTA Gait & Station:  UTA Patient leans: N/A  Psychiatric Specialty Exam: Review of Systems  Constitutional:  Positive for fatigue.  Psychiatric/Behavioral:  Positive for decreased concentration and sleep disturbance.   All other systems reviewed and are negative.   There were no vitals taken for this visit.There is no height or weight on file to calculate BMI.  General Appearance:  UTA  Eye Contact:   UTA  Speech:  Clear and Coherent  Volume:  Normal  Mood:  Anxious and Depressed  Affect:   UTA  Thought Process:  Goal Directed and Descriptions of Associations: Intact  Orientation:  Full (Time, Place, and Person)  Thought Content: Logical   Suicidal Thoughts:  No  Homicidal Thoughts:  No  Memory:  Immediate;   Fair Recent;   Fair Remote;   Fair  Judgement:  Fair  Insight:  Fair  Psychomotor Activity:   UTA  Concentration:  Concentration: Fair and Attention Span: Fair  Recall:  [employer] of Knowledge: Fair  Language: Fair  Akathisia:  No  Handed:  Right  AIMS (if indicated): not done  Assets:  Communication Skills Desire for Improvement  ADL's:  Intact  Cognition: WNL  Sleep:   restless   Screenings: GAD-7    Flowsheet Row Office Visit from 07/08/2022 in [facility]  Total GAD-7 Score 9      PHQ2-9    [place] Visit from 07/08/2022 in [facility] Office Visit from 01/06/2022 in [facility] Video Visit from 11/24/2021 in [facility]  PHQ-2 Total Score 3 2 0  PHQ-9 Total Score  13 12 0      Flowsheet Row Video Visit from 08/22/2022 in [facility] Office Visit from 07/08/2022 in [facility] to Hosp-Admission (Discharged) from 01/29/2022 in [facility] No Risk No Risk No Risk        Assessment and Plan: Megan Roach is a 46 year old [place] female 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  Patient will benefit from medication readjustment and psychotherapy sessions.  Plan as noted below.  Plan Other specified anxiety disorder-limited symptom attack-improving Lexapro 5 mg p.o. daily with supper.  Will not readjust the dosage of Lexapro since she already has side effects of sexual dysfunction. Patient is awaiting psychotherapy session, will advise to continue CBT.  MDD-unstable Add Wellbutrin 75 mg p.o. daily with breakfast Lexapro 5 mg p.o. daily with supper. Referred for CBT-has upcoming appointment  [place] Patient to start CBT  Patient to follow up with primary care provider for her flulike symptoms.  Follow-up in clinic in 4 to 6 weeks or sooner if needed.   I have spent at least 14 minutes non face to face with patient today.     Collaboration of Care: Collaboration of Care: Referral or follow-up with counselor/therapist [facility] encouraged patient to follow-up with therapist.  Patient/Guardian was advised Release of Information must be obtained prior to any record release in order to collaborate their care with an outside provider. Patient/Guardian was advised if they have not already done so to contact the registration department to sign all necessary forms in order for [place] to release information regarding their care.   Consent: Patient/Guardian gives verbal  consent for treatment and assignment of benefits for services provided during this visit. Patient/Guardian expressed understanding and agreed to proceed.   This note was generated in part or whole with voice recognition software. Voice recognition is usually quite accurate but there are transcription errors that can and very often do occur. I apologize for any typographical errors that were not detected and corrected.  Ursula Alert, MD 08/23/2022, 9:00 AM

## 2022-08-22 NOTE — Patient Instructions (Signed)
Bupropion Tablets (Depression/Mood Disorders) What is this medication? BUPROPION (byoo PROE pee on) treats depression. It increases norepinephrine and dopamine in the brain, hormones that help regulate mood. It belongs to a group of medications called NDRIs. This medicine may be used for other purposes; ask your health care provider or pharmacist if you have questions. COMMON BRAND NAME(S): Wellbutrin What should I tell my care team before I take this medication? They need to know if you have any of these conditions: An eating disorder, such as anorexia or bulimia Bipolar disorder or psychosis Diabetes or high blood sugar, treated with medication Glaucoma Heart disease, previous heart attack, or irregular heart beat Head injury or brain tumor High blood pressure Kidney or liver disease Seizures Suicidal thoughts or a previous suicide attempt Tourette's syndrome Weight loss An unusual or allergic reaction to bupropion, other medications, foods, dyes, or preservatives Pregnant or trying to become pregnant Breast-feeding How should I use this medication? Take this medication by mouth with a glass of water. Follow the directions on the prescription label. You can take it with or without food. If it upsets your stomach, take it with food. Take your medication at regular intervals. Do not take your medication more often than directed. Do not stop taking this medication suddenly except upon the advice of your care team. Stopping this medication too quickly may cause serious side effects or your condition may worsen. A special MedGuide will be given to you by the pharmacist with each prescription and refill. Be sure to read this information carefully each time. Talk to your care team regarding the use of this medication in children. Special care may be needed. Overdosage: If you think you have taken too much of this medicine contact a poison control center or emergency room at once. NOTE: This  medicine is only for you. Do not share this medicine with others. What if I miss a dose? If you miss a dose, take it as soon as you can. If it is less than four hours to your next dose, take only that dose and skip the missed dose. Do not take double or extra doses. What may interact with this medication? Do not take this medication with any of the following: Linezolid MAOIs like Azilect, Carbex, Eldepryl, Marplan, Nardil, and Parnate Methylene blue (injected into a vein) Other medications that contain bupropion like Zyban This medication may also interact with the following: Alcohol Certain medications for anxiety or sleep Certain medications for blood pressure like metoprolol, propranolol Certain medications for depression or psychotic disturbances Certain medications for HIV or AIDS like efavirenz, lopinavir, nelfinavir, ritonavir Certain medications for irregular heart beat like propafenone, flecainide Certain medications for Parkinson's disease like amantadine, levodopa Certain medications for seizures like carbamazepine, phenytoin, phenobarbital Cimetidine Clopidogrel Cyclophosphamide Digoxin Furazolidone Isoniazid Nicotine Orphenadrine Procarbazine Steroid medications like prednisone or cortisone Stimulant medications for attention disorders, weight loss, or to stay awake Tamoxifen Theophylline Thiotepa Ticlopidine Tramadol Warfarin This list may not describe all possible interactions. Give your health care provider a list of all the medicines, herbs, non-prescription drugs, or dietary supplements you use. Also tell them if you smoke, drink alcohol, or use illegal drugs. Some items may interact with your medicine. What should I watch for while using this medication? Tell your care team if your symptoms do not get better or if they get worse. Visit your care team for regular checks on your progress. Because it may take several weeks to see the full effects of this  medication,  it is important to continue your treatment as prescribed. Watch for new or worsening thoughts of suicide or depression. This includes sudden changes in mood, behavior, or thoughts. These changes can happen at any time but are more common in the beginning of treatment or after a change in dose. Call your care team right away if you experience these thoughts or worsening depression. Manic episodes may happen in patients with bipolar disorder who take this medication. Watch for changes in feelings or behaviors such as feeling anxious, nervous, agitated, panicky, irritable, hostile, aggressive, impulsive, severely restless, overly excited and hyperactive, or trouble sleeping. These symptoms can happen at anytime but are more common in the beginning of treatment or after a change in dose. Call your care team right away if you notice any of these symptoms. This medication may cause serious skin reactions. They can happen weeks to months after starting the medication. Contact your care team right away if you notice fevers or flu-like symptoms with a rash. The rash may be red or purple and then turn into blisters or peeling of the skin. Or, you might notice a red rash with swelling of the face, lips or lymph nodes in your neck or under your arms. Avoid drinks that contain alcohol while taking this medication. Drinking large amounts of alcohol, using sleeping or anxiety medications, or quickly stopping the use of these agents while taking this medication may increase your risk for a seizure. Do not drive or use heavy machinery until you know how this medication affects you. This medication can impair your ability to perform these tasks. Do not take this medication close to bedtime. It may prevent you from sleeping. Your mouth may get dry. Chewing sugarless gum or sucking hard candy, and drinking plenty of water may help. Contact your care team if the problem does not go away or is severe. What side  effects may I notice from receiving this medication? Side effects that you should report to your care team as soon as possible: Allergic reactions--skin rash, itching, hives, swelling of the face, lips, tongue, or throat Increase in blood pressure Mood and behavior changes--anxiety, nervousness, confusion, hallucinations, irritability, hostility, thoughts of suicide or self-harm, worsening mood, feelings of depression Redness, blistering, peeling, or loosening of the skin, including inside the mouth Seizures Sudden eye pain or change in vision such as blurry vision, seeing halos around lights, vision loss Side effects that usually do not require medical attention (report to your care team if they continue or are bothersome): Constipation Dizziness Dry mouth Loss of appetite Nausea Tremors or shaking Trouble sleeping This list may not describe all possible side effects. Call your doctor for medical advice about side effects. You may report side effects to FDA at 1-800-FDA-1088. Where should I keep my medication? Keep out of the reach of children and pets. Store at room temperature between 20 and 25 degrees C (68 and 77 degrees F), away from direct sunlight and moisture. Keep tightly closed. Throw away any unused medication after the expiration date. NOTE: This sheet is a summary. It may not cover all possible information. If you have questions about this medicine, talk to your doctor, pharmacist, or health care provider.  2023 Elsevier/Gold Standard (2020-10-05 00:00:00)

## 2022-09-02 ENCOUNTER — Ambulatory Visit: Payer: BC Managed Care – PPO | Admitting: Psychiatry

## 2022-09-13 ENCOUNTER — Other Ambulatory Visit: Payer: Self-pay | Admitting: Psychiatry

## 2022-09-13 DIAGNOSIS — F330 Major depressive disorder, recurrent, mild: Secondary | ICD-10-CM

## 2022-10-13 ENCOUNTER — Telehealth (INDEPENDENT_AMBULATORY_CARE_PROVIDER_SITE_OTHER): Payer: BC Managed Care – PPO | Admitting: Psychiatry

## 2022-10-13 ENCOUNTER — Encounter: Payer: Self-pay | Admitting: Psychiatry

## 2022-10-13 DIAGNOSIS — F418 Other specified anxiety disorders: Secondary | ICD-10-CM | POA: Diagnosis not present

## 2022-10-13 DIAGNOSIS — Z634 Disappearance and death of family member: Secondary | ICD-10-CM | POA: Diagnosis not present

## 2022-10-13 DIAGNOSIS — F330 Major depressive disorder, recurrent, mild: Secondary | ICD-10-CM | POA: Diagnosis not present

## 2022-10-13 MED ORDER — BUPROPION HCL 100 MG PO TABS: 100.0000 mg | ORAL_TABLET | Freq: Every morning | ORAL | 0 refills | Status: DC

## 2022-10-13 MED ORDER — ESCITALOPRAM OXALATE 5 MG PO TABS: 5.0000 mg | ORAL_TABLET | Freq: Every morning | ORAL | 0 refills | Status: DC

## 2022-10-13 NOTE — Progress Notes (Signed)
Virtual Visit via Video Note  I connected with Megan Roach on 10/13/22 at  2:00 PM EST by a video enabled telemedicine application and verified that I am speaking with the correct person using two identifiers.  Location Provider Location : [facility] Patient Location : Car  Participants: Patient ,Spouse, Provider    I discussed the limitations of evaluation and management by telemedicine and the availability of in person appointments. The patient expressed understanding and agreed to proceed.   I discussed the assessment and treatment plan with the patient. The patient was provided an opportunity to ask questions and all were answered. The patient agreed with the plan and demonstrated an understanding of the instructions.   The patient was advised to call back or seek an in-person evaluation if the symptoms worsen or if the condition fails to improve as anticipated.    [facility] MD OP Progress Note  10/13/2022 2:34 PM Megan Roach  MRN:  [id number]  Chief Complaint:  Chief Complaint  Patient presents with   Medication Refill   Depression   Insomnia   Anxiety   grief   HPI: Megan Roach is an [place] female, married, stay-at-home mom, lives in [place], has a history of other specified anxiety disorder, MDD, bereavement, thyroid disease, gastroesophageal reflux disease, history of endometriosis status post hysterectomy, migraine headaches, carpal tunnel syndrome, also symptoms was evaluated by telemedicine today.  Patient today reports she has noticed improvement in her mood , energy level and irritability since being on the Wellbutrin.  She reports she does not have any side effects to the Wellbutrin.  Although she has noticed improvement she reports she still would like dosage to be readjusted since there are days when she still struggles.  Anxiety symptoms are more manageable.  Reports there was a death in the family, her father-in-law passed away recently.   Grieving at this time.  Agreeable to have psychotherapy sessions however does not have a therapist yet.  Agreeable to be referred to therapist at our practice.  Patient reports sleep continues to be a problem.  Does not have difficulty falling asleep however sleep is interrupted.  Does have perimenopausal symptoms, recent exacerbation of hot flashes.  Does have upcoming appointment with OB/GYN and plans to discuss.  Tried sleep #3 over-the-counter however that did not help.  Agreeable to trial of extended release melatonin.  Reports sleep problems likely contributing to low energy during the day.  Has never had a sleep study done previously.  Patient denies any suicidality, homicidality or perceptual disturbances.  Patient denies any other concerns today.  Visit Diagnosis:    ICD-10-CM   1. Other specified anxiety disorders  F41.8 buPROPion (WELLBUTRIN) 100 MG tablet    escitalopram (LEXAPRO) 5 MG tablet   with limited symptom attacks    2. Mild episode of recurrent major depressive disorder (HCC)  F33.0 buPROPion (WELLBUTRIN) 100 MG tablet    escitalopram (LEXAPRO) 5 MG tablet    3. Bereavement  Z63.4       Past Psychiatric History: Reviewed past psychiatric history from progress note on 07/08/2022.  Past trials of venlafaxine.  Past Medical History:  Past Medical History:  Diagnosis Date   Asthma     Past Surgical History:  Procedure Laterality Date   CESAREAN SECTION     total of 3   RECTAL PROLAPSE REPAIR     TOTAL VAGINAL HYSTERECTOMY      Family Psychiatric History: Reviewed family psychiatric history from progress note on 07/08/2022.  Family  History:  Family History  Problem Relation Age of Onset   Depression Mother    Asthma Mother    Bladder Cancer Father    Idiopathic pulmonary fibrosis Father    Alcohol abuse Brother    Gallbladder disease Brother    Liver disease Brother    Cervical cancer Other    Anxiety disorder Son    ADD / ADHD Son     Social  History: Reviewed social history from progress note on 07/08/2022. Social History   Socioeconomic History   Marital status: Married    Spouse name: sugith   Number of children: 3   Years of education: Not on file   Highest education level: Professional school degree (e.g., MD, DDS, DVM, JD)  Occupational History   Not on file  Tobacco Use   Smoking status: Never   Smokeless tobacco: Never  Vaping Use   Vaping Use: Never used  Substance and Sexual Activity   Alcohol use: Yes    Alcohol/week: 1.0 standard drink of alcohol    Types: 1 Glasses of wine per week    Comment: socially   Drug use: Never   Sexual activity: Yes  Other Topics Concern   Not on file  Social History Narrative   Lives at home with family   R handed   Caffeine: 2 C of coffee a day   Social Determinants of Health   Financial Resource Strain: Not on file  Food Insecurity: Not on file  Transportation Needs: Not on file  Physical Activity: Not on file  Stress: Not on file  Social Connections: Not on file    Allergies:  Allergies  Allergen Reactions   Sulfa Antibiotics Swelling    Metabolic Disorder Labs: Lab Results  Component Value Date   HGBA1C 5.4 12/03/2021   No results found for: "PROLACTIN" Lab Results  Component Value Date   CHOL 195 01/30/2022   TRIG 54 01/30/2022   HDL 64 01/30/2022   CHOLHDL 3.0 01/30/2022   VLDL 11 01/30/2022   LDLCALC 120 (H) 01/30/2022   LDLCALC 119 (H) 12/03/2021   Lab Results  Component Value Date   TSH 1.98 12/03/2021    Therapeutic Level Labs: No results found for: "LITHIUM" No results found for: "VALPROATE" No results found for: "CBMZ"  Current Medications: Current Outpatient Medications  Medication Sig Dispense Refill   buPROPion (WELLBUTRIN) 100 MG tablet Take 1 tablet (100 mg total) by mouth in the morning. 90 tablet 0   Cholecalciferol (VITAMIN D3 PO) Take 1 capsule by mouth daily.     clonazePAM (KLONOPIN) 0.5 MG tablet Take by mouth.      estradiol (MINIVELLE) 0.05 MG/24HR patch Apply 1 patch twice a week by transdermal route.     Magnesium 500 MG CAPS Take by mouth.     Multiple Vitamin (MULTIVITAMIN WITH MINERALS) TABS tablet Take 1 tablet by mouth daily.     escitalopram (LEXAPRO) 5 MG tablet Take 1 tablet (5 mg total) by mouth in the morning. 90 tablet 0   ibuprofen (ADVIL) 400 MG tablet Take 400 mg by mouth every 6 (six) hours as needed. (Patient not taking: Reported on 10/13/2022)     rizatriptan (MAXALT) 5 MG tablet Take 1 tablet (5 mg total) by mouth as needed for migraine. May repeat in 2 hours if needed (Patient not taking: Reported on 10/13/2022) 10 tablet 6   No current facility-administered medications for this visit.     Musculoskeletal: Strength & Muscle Tone:  UTA  Gait & Station:  Seated Patient leans: N/A  Psychiatric Specialty Exam: Review of Systems  Endocrine:       Reports hot flashes  Psychiatric/Behavioral:  Positive for sleep disturbance.        Grieving  All other systems reviewed and are negative.   There were no vitals taken for this visit.There is no height or weight on file to calculate BMI.  General Appearance: Casual  Eye Contact:  Fair  Speech:  Normal Rate  Volume:  Normal  Mood:   Grieving  Affect:  Congruent  Thought Process:  Goal Directed and Descriptions of Associations: Intact  Orientation:  Full (Time, Place, and Person)  Thought Content: Logical   Suicidal Thoughts:  No  Homicidal Thoughts:  No  Memory:  Immediate;   Fair Recent;   Fair Remote;   Fair  Judgement:  Fair  Insight:  Fair  Psychomotor Activity:  Normal  Concentration:  Concentration: Fair and Attention Span: Fair  Recall:  [employer] of Knowledge: Fair  Language: Fair  Akathisia:  No  Handed:  Right  AIMS (if indicated): not done  Assets:  Communication Skills Desire for Improvement Housing Intimacy Social Support Transportation  ADL's:  Intact  Cognition: WNL  Sleep:  Poor    Screenings: GAD-7    Flowsheet Row Office Visit from 07/08/2022 in [facility]  Total GAD-7 Score 9      PHQ2-9    [facility] Visit from 07/08/2022 in [facility] Office Visit from 01/06/2022 in [facility] Video Visit from 11/24/2021 in [facility]  PHQ-2 Total Score 3 2 0  PHQ-9 Total Score 13 12 0      Flowsheet Row Video Visit from 10/13/2022 in [facility] Video Visit from 08/22/2022 in [facility] Office Visit from 07/08/2022 in [facility] No Risk No Risk No Risk        Assessment and Plan: Taurus Willis is a 46 year old Asian [place] female 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  Plan as noted below.  Plan Other specified anxiety disorder-limited symptom attack-improving Lexapro 5 mg p.o. daily .  Patient advised to change the Lexapro to morning since she has sleep problems. Does have clonazepam available 0.5 mg as needed from previous prescription by primary provider-has not been using it. Referral for CBT-communicated with staff to place this patient on a wait list for  new therapist.  Patient also advised to call the clinic mid-January for an update.   MDD-some improvement Increase Wellbutrin to 100 mg p.o. daily with breakfast Lexapro 5 mg p.o. daily in the morning. Referral for CBT Advised to start taking extended release melatonin at bedtime for sleep. Patient also advised to address her hot flashes which are getting worse which likely affecting sleep. Will consider sleep study referral in the future.  Bereavement-unstable Recent death in the family.   Will refer for CBT/grief counseling.  Follow-up in clinic in 6 to 8 weeks or sooner if needed. Collaboration of Care: Collaboration of Care: Referral or follow-up with counselor/therapist [facility] communicated with staff to schedule this patient with a new therapist, patient to be placed on a wait list.  Patient/Guardian was advised Release of Information must be obtained prior to any record release in order to collaborate  their care with an outside provider. Patient/Guardian was advised if they have not already done so to contact the registration department to sign all necessary forms in order for [place] to release information regarding their care.   Consent: Patient/Guardian gives verbal consent for treatment and assignment of benefits for services provided during this visit. Patient/Guardian expressed understanding and agreed to proceed.   This note was generated in part or whole with voice recognition software. Voice recognition is usually quite accurate but there are transcription errors that can and very often do occur. I apologize for any typographical errors that were not detected and corrected.      Ursula Alert, MD 10/14/2022, 8:24 AM

## 2022-10-19 ENCOUNTER — Encounter: Payer: Self-pay | Admitting: Obstetrics and Gynecology

## 2022-11-07 ENCOUNTER — Other Ambulatory Visit: Payer: Self-pay | Admitting: Family Medicine

## 2022-11-07 ENCOUNTER — Ambulatory Visit
Admission: RE | Admit: 2022-11-07 | Discharge: 2022-11-07 | Disposition: A | Discharge status: Home / Self Care | Payer: BC Managed Care – PPO | Source: Ambulatory Visit | Attending: Family Medicine | Admitting: Family Medicine

## 2022-11-07 DIAGNOSIS — R058 Other specified cough: Secondary | ICD-10-CM

## 2022-11-08 ENCOUNTER — Encounter: Payer: Self-pay | Admitting: Internal Medicine

## 2022-11-18 ENCOUNTER — Other Ambulatory Visit: Payer: Self-pay | Admitting: Family Medicine

## 2022-11-18 ENCOUNTER — Ambulatory Visit
Admission: RE | Admit: 2022-11-18 | Discharge: 2022-11-18 | Disposition: A | Discharge status: Home / Self Care | Payer: BC Managed Care – PPO | Source: Ambulatory Visit | Attending: Family Medicine | Admitting: Family Medicine

## 2022-11-18 DIAGNOSIS — R109 Unspecified abdominal pain: Secondary | ICD-10-CM

## 2022-11-18 MED ORDER — IOPAMIDOL (ISOVUE-300) INJECTION 61%
100.0000 mL | Freq: Once | INTRAVENOUS | Status: AC | PRN
  Administered 2022-11-18: 100 mL via INTRAVENOUS

## 2022-12-12 ENCOUNTER — Other Ambulatory Visit: Payer: Self-pay | Admitting: Psychiatry

## 2022-12-12 DIAGNOSIS — F330 Major depressive disorder, recurrent, mild: Secondary | ICD-10-CM

## 2022-12-20 ENCOUNTER — Encounter: Payer: Self-pay | Admitting: Psychiatry

## 2022-12-20 ENCOUNTER — Telehealth (INDEPENDENT_AMBULATORY_CARE_PROVIDER_SITE_OTHER): Payer: BC Managed Care – PPO | Admitting: Psychiatry

## 2022-12-20 DIAGNOSIS — F330 Major depressive disorder, recurrent, mild: Secondary | ICD-10-CM | POA: Diagnosis not present

## 2022-12-20 DIAGNOSIS — Z9189 Other specified personal risk factors, not elsewhere classified: Secondary | ICD-10-CM | POA: Diagnosis not present

## 2022-12-20 DIAGNOSIS — Z634 Disappearance and death of family member: Secondary | ICD-10-CM

## 2022-12-20 DIAGNOSIS — F418 Other specified anxiety disorders: Secondary | ICD-10-CM

## 2022-12-20 MED ORDER — ESCITALOPRAM OXALATE 10 MG PO TABS
10.0000 mg | ORAL_TABLET | Freq: Every day | ORAL | 1 refills | Status: DC
Start: 2022-12-20 — End: 2023-01-03

## 2022-12-20 NOTE — Patient Instructions (Addendum)
Please call for EKG - [phone]  Quetiapine Tablets What is this medication? QUETIAPINE (kwe TYE a peen) treats schizophrenia and bipolar disorder. It works by balancing the levels of dopamine and serotonin in your brain, hormones that help regulate mood, behaviors, and thoughts. It belongs to a group of medications called antipsychotics. Antipsychotic medications can be used to treat several kinds of mental health conditions. This medicine may be used for other purposes; ask your health care provider or pharmacist if you have questions. COMMON BRAND NAME(S): Seroquel What should I tell my care team before I take this medication? They need to know if you have any of these conditions: Blockage in your bowels Cataracts Constipation Dementia Diabetes Difficulty swallowing Glaucoma Heart disease High levels of prolactin History of breast cancer History of irregular heartbeat Liver disease Low blood cell levels (white cells, red cells, and platelets) Low blood pressure Parkinson disease Prostate disease Seizures Suicidal thoughts, plans, or attempt by you or a family member Thyroid disease Trouble passing urine An unusual or allergic reaction to quetiapine, other medications, foods, dyes, or preservatives Pregnant or trying to get pregnant Breastfeeding How should I use this medication? Take this medication by mouth with water. Take it as directed on the prescription label at the same time every day. You can take it with or without food. If it upsets your stomach, take it with food. Keep taking it unless your care team tells you to stop. A special MedGuide will be given to you by the pharmacist with each prescription and refill. Be sure to read this information carefully each time. Talk to your care team about the use of this medication in children. While this medication may be prescribed for children as young as 10 years for selected conditions, precautions do apply. People over  87 years of age may have a stronger reaction to this medication and need smaller doses. Overdosage: If you think you have taken too much of this medicine contact a poison control center or emergency room at once. NOTE: This medicine is only for you. Do not share this medicine with others. What if I miss a dose? If you miss a dose, take it as soon as you can. If it is almost time for your next dose, take only that dose. Do not take double or extra doses. What may interact with this medication? Do not take this medication with any of the following: Cisapride Dronedarone Metoclopramide Pimozide Thioridazine This medication may also interact with the following: Alcohol Antihistamines for allergy, cough, and cold Atropine Avasimibe Certain antivirals for HIV or hepatitis Certain medications for anxiety or sleep Certain medications for bladder problems, such as oxybutynin, tolterodine Certain medications for depression, such as amitriptyline, fluoxetine, nefazodone, sertraline Certain medications for fungal infections, such as fluconazole, ketoconazole, itraconazole, posaconazole Certain medications for stomach problems, such as dicyclomine, hyoscyamine Certain medications for travel sickness, such as scopolamine Cimetidine General anesthetics, such as halothane, isoflurane, methoxyflurane, propofol Ipratropium Levodopa or other medications for Parkinson disease Medications for blood pressure Medications for seizures Medications that relax muscles for surgery Opioid medications for pain Other medications that cause heart rhythm changes Phenothiazines, such as chlorpromazine, prochlorperazine Rifampin St. John's wort This list may not describe all possible interactions. Give your health care provider a list of all the medicines, herbs, non-prescription drugs, or dietary supplements you use. Also tell them if you smoke, drink alcohol, or use illegal drugs. Some items may interact with  your medicine. What should I watch for while using  this medication? Visit your care team for regular checks on your progress. Tell your care team if your symptoms do not start to get better or if they get worse. Do not suddenly stop taking This medication. You may develop a severe reaction. Your care team will tell you how much medication to take. If your care team wants you to stop the medication, the dose may be slowly lowered over time to avoid any side effects. You may need to have an eye exam before and during use of this medication. This medication may increase blood sugar. Ask your care team if changes in diet or medications are needed if you have diabetes. This medication may cause thoughts of suicide or depression. This includes sudden changes in mood, behaviors, or thoughts. These changes can happen at any time but are more common in the beginning of treatment or after a change in dose. Call your care team right away if you experience these thoughts or worsening depression. This medication may affect your coordination, reaction time, or judgment. Do not drive or operate machinery until you know how this medication affects you. Sit up or stand slowly to reduce the risk of dizzy or fainting spells. Drinking alcohol with this medication can increase the risk of these side effects. This medication can cause problems with controlling your body temperature. It can lower the response of your body to cold temperatures. If possible, stay indoors during cold weather. If you must go outdoors, wear warm clothes. It can also lower the response of your body to heat. Do not overheat. Do not over-exercise. Stay out of the sun when possible. If you must be in the sun, wear cool clothing. Drink plenty of water. If you have trouble controlling your body temperature, call your care team right away. What side effects may I notice from receiving this medication? Side effects that you should report to your care team as  soon as possible: Allergic reactions--skin rash, itching, hives, swelling of the face, lips, tongue, or throat Heart rhythm changes--fast or irregular heartbeat, dizziness, feeling faint or lightheaded, chest pain, trouble breathing High blood sugar (hyperglycemia)--increased thirst or amount of urine, unusual weakness or fatigue, blurry vision High fever, stiff muscles, increased sweating, fast or irregular heartbeat, and confusion, which may be signs of neuroleptic malignant syndrome High prolactin level--unexpected breast tissue growth, discharge from the nipple, change in sex drive or performance, irregular menstrual cycle Increase in blood pressure in children Infection--fever, chills, cough, or sore throat Low blood pressure--dizziness, feeling faint or lightheaded, blurry vision Low thyroid levels (hypothyroidism)--unusual weakness or fatigue, increased sensitivity to cold, constipation, hair loss, dry skin, weight gain, feelings of depression Pain or trouble swallowing Seizures Stroke--sudden numbness or weakness of the face, arm, or leg, trouble speaking, confusion, trouble walking, loss of balance or coordination, dizziness, severe headache, change in vision Sudden eye pain or change in vision such as blurry vision, seeing halos around lights, vision loss Thoughts of suicide or self-harm, worsening mood, feelings of depression Trouble passing urine Uncontrolled and repetitive body movements, muscle stiffness or spasms, tremors or shaking, loss of balance or coordination, restlessness, shuffling walk, which may be signs of extrapyramidal symptoms (EPS) Side effects that usually do not require medical attention (report to your care team if they continue or are bothersome): Constipation Dizziness Drowsiness Dry mouth Weight gain This list may not describe all possible side effects. Call your doctor for medical advice about side effects. You may report side effects to FDA at  1-800-FDA-1088.  Where should I keep my medication? Keep out of the reach of children. Store at room temperature between 15 and 30 degrees C (59 and 86 degrees F). Throw away any unused medication after the expiration date. NOTE: This sheet is a summary. It may not cover all possible information. If you have questions about this medicine, talk to your doctor, pharmacist, or health care provider.  2023 Elsevier/Gold Standard (2022-04-18 00:00:00)

## 2022-12-20 NOTE — Progress Notes (Unsigned)
Virtual Visit via Video Note  I connected with Megan Roach on 12/20/22 at 10:30 AM EST by a video enabled telemedicine application and verified that I am speaking with the correct person using two identifiers.  Location Provider Location : [facility] Patient Location : Home  Participants: Patient , Provider   I discussed the limitations of evaluation and management by telemedicine and the availability of in person appointments. The patient expressed understanding and agreed to proceed.   I discussed the assessment and treatment plan with the patient. The patient was provided an opportunity to ask questions and all were answered. The patient agreed with the plan and demonstrated an understanding of the instructions.   The patient was advised to call back or seek an in-person evaluation if the symptoms worsen or if the condition fails to improve as anticipated.    B[facility] MD OP Progress Note  12/21/2022 10:34 AM Deslyn Schmelter  MRN:  [id number]  Chief Complaint:  Chief Complaint  Patient presents with   Follow-up   Anxiety   Depression   HPI: Megan Roach is a [place] female, married, stay-at-home mom, lives in [place], has a history of other specified anxiety disorder, MDD, bereavement, thyroid disease, gastroesophageal reflux disease, history of endometriosis status post hysterectomy, migraine headaches, carpal tunnel syndrome was evaluated by telemedicine today.  Patient today reports since the death of her father-in-law recently she has been having worsening grief reaction especially intrusive memories about the death of her own father.  She reports she is also struggling with some internal conflict since she was not able to be there when her father passed away however was able to be there when her father-in-law passed away.  She reports she hence has been having racing thoughts about this.  She currently does not have a therapist however does have 1 scheduled on  teledoc.  Patient also reports recent impulsivity, mostly because of anxiety and sadness and trying to feel a void in her heart especially by shopping a lot.  She reports she spent a lot of money shopping when she was in [place] and also after returning.  She reports she tried to get help from her spouse by giving away her credit cards to him for safekeeping.  She however reports she has reached a point when she is afraid to leave her home because she is worried she may go back into shopping sprees.  She reports she also has mood swings, has had sleep problems due to hot flashes, ongoing since the past several months, not sure if she has any other hypomanic or manic symptoms.  Increased to monitor herself closely.  Patient did not express any suicidality.  Denies any homicidality or perceptual disturbances.  Currently compliant on the Lexapro, Wellbutrin.  Does not believe the Wellbutrin may have contributed to worsening anxiety.  Would like to stay on the current dosage.  Discussed adding a mood stabilizer like Seroquel however would like to hold off for now since she is worried about long-term side effects and does not want anything stronger at this time.  Would like to establish care with therapist and is agreeable to readjusting the dosage of Lexapro for now.  Patient reports she recently was sick with pancreatitis, currently improved.  Continues to follow-up with providers.  Patient denies any other concerns today.  Visit Diagnosis:    ICD-10-CM   1. Other specified anxiety disorders  F41.8 EKG 12-Lead    escitalopram (LEXAPRO) 10 MG tablet   Limited symptom attack  2. Mild episode of recurrent major depressive disorder (HCC)  F33.0 EKG 12-Lead    escitalopram (LEXAPRO) 10 MG tablet    3. Bereavement  Z63.4     4. At risk for prolonged QT interval syndrome  Z91.89 EKG 12-Lead      Past Psychiatric History: Reviewed past psychiatric history from progress note on 07/08/2022.  Past  trials of venlafaxine.  Past Medical History:  Past Medical History:  Diagnosis Date   Asthma     Past Surgical History:  Procedure Laterality Date   CESAREAN SECTION     total of 3   RECTAL PROLAPSE REPAIR     TOTAL VAGINAL HYSTERECTOMY      Family Psychiatric History: Reviewed family History from progress note on 07/08/2022.  Family History:  Family History  Problem Relation Age of Onset   Depression Mother    Asthma Mother    Bladder Cancer Father    Idiopathic pulmonary fibrosis Father    Alcohol abuse Brother    Gallbladder disease Brother    Liver disease Brother    Cervical cancer Other    Anxiety disorder Son    ADD / ADHD Son     Social History: Reviewed social history from progress note on 07/08/2022. Social History   Socioeconomic History   Marital status: Married    Spouse name: sugith   Number of children: 3   Years of education: Not on file   Highest education level: Professional school degree (e.g., MD, DDS, DVM, JD)  Occupational History   Not on file  Tobacco Use   Smoking status: Never   Smokeless tobacco: Never  Vaping Use   Vaping Use: Never used  Substance and Sexual Activity   Alcohol use: Yes    Alcohol/week: 1.0 standard drink of alcohol    Types: 1 Glasses of wine per week    Comment: socially   Drug use: Never   Sexual activity: Yes  Other Topics Concern   Not on file  Social History Narrative   Lives at home with family   R handed   Caffeine: 2 C of coffee a day   Social Determinants of Health   Financial Resource Strain: Not on file  Food Insecurity: Not on file  Transportation Needs: Not on file  Physical Activity: Not on file  Stress: Not on file  Social Connections: Not on file    Allergies:  Allergies  Allergen Reactions   Sulfa Antibiotics Swelling    Metabolic Disorder Labs: Lab Results  Component Value Date   HGBA1C 5.4 12/03/2021   No results found for: "PROLACTIN" Lab Results  Component Value Date    CHOL 195 01/30/2022   TRIG 54 01/30/2022   HDL 64 01/30/2022   CHOLHDL 3.0 01/30/2022   VLDL 11 01/30/2022   LDLCALC 120 (H) 01/30/2022   LDLCALC 119 (H) 12/03/2021   Lab Results  Component Value Date   TSH 1.98 12/03/2021    Therapeutic Level Labs: No results found for: "LITHIUM" No results found for: "VALPROATE" No results found for: "CBMZ"  Current Medications: Current Outpatient Medications  Medication Sig Dispense Refill   buPROPion (WELLBUTRIN) 100 MG tablet Take 1 tablet (100 mg total) by mouth in the morning. 90 tablet 0   Cholecalciferol (VITAMIN D3 PO) Take 1 capsule by mouth daily.     clonazePAM (KLONOPIN) 0.5 MG tablet Take by mouth.     escitalopram (LEXAPRO) 10 MG tablet Take 1 tablet (10 mg total) by mouth daily  with breakfast. 30 tablet 1   estradiol (MINIVELLE) 0.05 MG/24HR patch Apply 1 patch twice a week by transdermal route.     Magnesium 500 MG CAPS Take by mouth.     Multiple Vitamin (MULTIVITAMIN WITH MINERALS) TABS tablet Take 1 tablet by mouth daily.     pantoprazole (PROTONIX) 40 MG tablet Take 40 mg by mouth at bedtime.     ibuprofen (ADVIL) 400 MG tablet Take 400 mg by mouth every 6 (six) hours as needed. (Patient not taking: Reported on 10/13/2022)     oxyCODONE (OXY IR/ROXICODONE) 5 MG immediate release tablet Take 5 mg by mouth every 4 (four) hours as needed. (Patient not taking: Reported on 12/20/2022)     rizatriptan (MAXALT) 5 MG tablet Take 1 tablet (5 mg total) by mouth as needed for migraine. May repeat in 2 hours if needed (Patient not taking: Reported on 10/13/2022) 10 tablet 6   No current facility-administered medications for this visit.     Musculoskeletal: Strength & Muscle Tone:  UTA Gait & Station:  Seated Patient leans: N/A  Psychiatric Specialty Exam: Review of Systems  Psychiatric/Behavioral:  Positive for decreased concentration, dysphoric mood and sleep disturbance. The patient is nervous/anxious.   All other systems  reviewed and are negative.   There were no vitals taken for this visit.There is no height or weight on file to calculate BMI.  General Appearance: Casual  Eye Contact:  Fair  Speech:  Clear and Coherent  Volume:  Normal  Mood:  Anxious and Depressed  Affect:  Congruent  Thought Process:  Goal Directed and Descriptions of Associations: Intact  Orientation:  Full (Time, Place, and Person)  Thought Content: Logical   Suicidal Thoughts:  No  Homicidal Thoughts:  No  Memory:  Immediate;   Fair Recent;   Fair Remote;   Fair  Judgement:  Fair  Insight:  Fair  Psychomotor Activity:  Normal  Concentration:  Concentration: Fair and Attention Span: Fair  Recall:  [employer] of Knowledge: Fair  Language: Fair  Akathisia:  No  Handed:  Right  AIMS (if indicated): not done  Assets:  Communication Skills [employer] for [place] Talents/Skills Transportation Vocational/Educational  ADL's:  Intact  Cognition: WNL  Sleep:   restless   Screenings: GAD-7    Flowsheet Row Office Visit from 07/08/2022 in [place]  Total GAD-7 Score 9      PHQ2-9    [facility] Office Visit from 07/08/2022 in [place] Office Visit from 01/06/2022 in [place] at [facility] Video Visit from 11/24/2021 in [place] at [facility] Total Score 3 2 0  PHQ-9 Total Score 13 12 0      Flowsheet Row Video Visit from 10/13/2022 in [facility] Video Visit from 08/22/2022 in [place] Office Visit from 07/08/2022 in [place] No Risk No Risk No Risk        Assessment and Plan: Megan Roach is a 47 year old [place] 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  Patient with history of mood swings, sleep problems, recent impulsivity, spending sprees, will need to rule out bipolar disorder versus borderline personality disorder however currently does not have enough criteria  for definitive diagnosis.  Plan as noted below.  Plan Other specified anxiety disorder-limited symptom attack-unstable Increase Lexapro to 10 mg p.o. daily Does have clonazepam available 0.5 mg as needed however has not been using it. Patient referred for CBT-pending I have provided information for Ms. Miguel Dibble therapist I have also communicated with Ms. Miguel Dibble regarding this patient.  Patient to reach out.  MDD-unstable Wellbutrin 100 mg p.o. daily Increase Lexapro to 10 mg p.o. daily in the morning Discussed adding low-dose Seroquel-patient declines at this time. Will consider referral for sleep study in the future as needed for continued fatigue during the day and sleep problems at night.  Bereavement-unstable She will benefit from CBT.  Referred for the same.  At risk for prolonged QT syndrome-we will order EKG.  Patient would like to talk to her primary care provider regarding this.  She will need an EKG if she is interested in trial of Seroquel.  High risk medication use-patient will also need baseline labs including lipid panel, hemoglobin A1c, prolactin, sodium, platelet count.  Patient agrees to send all the recent labs to this office.  Follow-up in clinic in 1 month or sooner if needed.   Collaboration of Care: Collaboration of Care: Other communicated with Ms. Otila Kluver Thompson-therapist regarding this patient.  Patient provided information and advised to reach out.  Patient/Guardian was advised Release of Information must be obtained prior to any record release in order to collaborate their care with an outside provider. Patient/Guardian  was advised if they have not already done so to contact the registration department to sign all necessary forms in order for [place] to release information regarding their care.   Consent: Patient/Guardian gives verbal consent for treatment and assignment of benefits for services provided during this visit. Patient/Guardian expressed understanding and agreed to proceed.   This note was generated in part or whole with voice recognition software. Voice recognition is usually quite accurate but there are transcription errors that can and very often do occur. I apologize for any typographical errors that were not detected and corrected.     Ursula Alert, MD 12/21/2022, 10:34 AM

## 2022-12-29 ENCOUNTER — Encounter: Payer: Self-pay | Admitting: Internal Medicine

## 2022-12-29 ENCOUNTER — Ambulatory Visit (INDEPENDENT_AMBULATORY_CARE_PROVIDER_SITE_OTHER): Payer: BC Managed Care – PPO | Admitting: Internal Medicine

## 2022-12-29 VITALS — BP 126/78 | HR 84 | Ht 59.0 in | Wt 154.4 lb

## 2022-12-29 DIAGNOSIS — R1310 Dysphagia, unspecified: Secondary | ICD-10-CM

## 2022-12-29 DIAGNOSIS — K219 Gastro-esophageal reflux disease without esophagitis: Secondary | ICD-10-CM | POA: Diagnosis not present

## 2022-12-29 NOTE — Patient Instructions (Signed)
You have been scheduled for an endoscopy. Please follow written instructions given to you at your visit today. If you use inhalers (even only as needed), please bring them with you on the day of your procedure.   _______________________________________________________  If your blood pressure at your visit was 140/90 or greater, please contact your primary care physician to follow up on this.  _______________________________________________________  If you are age 29 or older, your body mass index should be between 23-30. Your Body mass index is 31.18 kg/m. If this is out of the aforementioned range listed, please consider follow up with your Primary Care Provider.  If you are age 24 or younger, your body mass index should be between 19-25. Your Body mass index is 31.18 kg/m. If this is out of the aformentioned range listed, please consider follow up with your Primary Care Provider.   ________________________________________________________  The [place] providers would like to encourage you to use [facility] to communicate with providers for non-urgent requests or questions.  Due to long hold times on the telephone, sending your provider a message by [facility] may be a faster and more efficient way to get a response.  Please allow 48 business hours for a response.  Please remember that this is for non-urgent requests.  _______________________________________________________   Due to recent changes in healthcare laws, you may see the results of your imaging and laboratory studies on MyChart before your provider has had a chance to review them.  We understand that in some cases there may be results that are confusing or concerning to you. Not all laboratory results come back in the same time frame and the provider may be waiting for multiple results in order to interpret others.  Please give [place] 48 hours in order for your provider to thoroughly review all the results before contacting the office for  clarification of your results.    Thank you for entrusting me with your care and for choosing [facility], Dr. Christia Reading

## 2022-12-29 NOTE — Progress Notes (Signed)
Chief Complaint: Dysphagia  HPI : 47 year old female with history of asthma, prior pancreatitis, and GERD presents with dysphagia  She has had dysphagia for the last 8 months, but this has been worsening over time. She can always feel like there is something in her throat. If she eats meats or anything that is solid, then she will choke in the lower throat. She has to chew very carefully to get foods down. When she yawns, she can feel tightness in her neck. She has a constant drip in the back of her throat. Endorses chest burning and regurgitation. She does take pantoprazole to help with the reflux and tries to avoid acidic foods. Endorses some cough at night time that wakes her up from sleep. Endorses some nausea, especially in the morning. Denies vomiting. She had pancreatitis 1 month ago that was suspected to be due to a viral infection. She went on a bland diet as a result of her pancreatitis and got better over time. She is still avoiding fatty foods currently. She had  ab pain during her episode of pancreatitis but this improved. She will have 1-2 glasses of wine every week. Denies prior EGD. Denies family history of GI issues. Denies blood in stools. She did Cologuard last year, which was negative. Denies constipation. Will have occasional diarrhea. Weight has been steady.  Wt Readings from Last 3 Encounters:  12/29/22 154 lb 6 oz (70 kg)  03/11/22 159 lb 6.4 oz (72.3 kg)  01/06/22 153 lb 6.4 oz (69.6 kg)   Past Medical History:  Diagnosis Date   Asthma     Past Surgical History:  Procedure Laterality Date   CESAREAN SECTION     total of 3   RECTAL PROLAPSE REPAIR     TOTAL VAGINAL HYSTERECTOMY     Family History  Problem Relation Age of Onset   Depression Mother    Asthma Mother    Bladder Cancer Father    Idiopathic pulmonary fibrosis Father    Alcohol abuse Brother    Gallbladder disease Brother    Liver disease Brother    Anxiety disorder Son    ADD / ADHD Son     Cervical cancer Other    Colon cancer Neg Hx    Colon polyps Neg Hx    Esophageal cancer Neg Hx    Stomach cancer Neg Hx    Pancreatic cancer Neg Hx    Social History   Tobacco Use   Smoking status: Never   Smokeless tobacco: Never  Vaping Use   Vaping Use: Never used  Substance Use Topics   Alcohol use: Yes    Alcohol/week: 1.0 standard drink of alcohol    Types: 1 Glasses of wine per week    Comment: socially   Drug use: Never   Current Outpatient Medications  Medication Sig Dispense Refill   albuterol (VENTOLIN HFA) 108 (90 Base) MCG/ACT inhaler Inhale 2 puffs into the lungs every 4 (four) hours as needed.     budesonide-formoterol (SYMBICORT) 160-4.5 MCG/ACT inhaler Inhale 2 puffs into the lungs daily.     buPROPion (WELLBUTRIN) 100 MG tablet Take 1 tablet (100 mg total) by mouth in the morning. 90 tablet 0   escitalopram (LEXAPRO) 10 MG tablet Take 1 tablet (10 mg total) by mouth daily with breakfast. 30 tablet 1   estradiol (MINIVELLE) 0.05 MG/24HR patch Apply 1 patch twice a week by transdermal route.     Magnesium 500 MG CAPS Take 1 capsule  by mouth daily.     Multiple Vitamin (MULTIVITAMIN WITH MINERALS) TABS tablet Take 1 tablet by mouth daily.     pantoprazole (PROTONIX) 40 MG tablet Take 40 mg by mouth at bedtime.     rizatriptan (MAXALT) 5 MG tablet Take 1 tablet (5 mg total) by mouth as needed for migraine. May repeat in 2 hours if needed 10 tablet 6   No current facility-administered medications for this visit.   Allergies  Allergen Reactions   Sulfa Antibiotics Swelling     Review of Systems: All systems reviewed and negative except where noted in HPI.   Physical Exam: BP 126/78   Pulse 84   Ht 4\' 11"  (1.499 m)   Wt 154 lb 6 oz (70 kg)   SpO2 99%   BMI 31.18 kg/m  Constitutional: Pleasant,well-developed, female in no acute distress. HEENT: Normocephalic and atraumatic. Conjunctivae are normal. No scleral icterus. Cardiovascular: Normal rate,  regular rhythm.  Pulmonary/chest: Effort normal and breath sounds normal. No wheezing, rales or rhonchi. Abdominal: Soft, nondistended, mildly tender in the lower abdomen. Bowel sounds active throughout. There are no masses palpable. No hepatomegaly. Extremities: No edema Neurological: Alert and oriented to person place and time. Skin: Skin is warm and dry. No rashes noted. Psychiatric: Normal mood and affect. Behavior is normal.  Labs 01/2022: CBC nml. CMP unremarkable.   CT A/P w/contrast 11/18/22: IMPRESSION: 1. Findings suspicious for mild acute uncomplicated pancreatitis involving the uncinate process of the pancreas. No fluid collection, pseudocyst, or abscess. Please correlate with pancreatic enzymes. 2. Hepatic steatosis. 3. Normal appendix.  ASSESSMENT AND PLAN: Dysphagia GERD History of pancreatitis Patient presents with dysphagia that has been worsening steadily over the last several months.  She does have history of GERD and takes daily PPI therapy to try and help with reflux control.  At this time I would be suspicious of a peptic stricture that may have developed from longstanding reflux and/or eosinophilic esophagitis due to the patient's history of asthma.  Will plan for an EGD for further evaluation. - GERD handout - EGD [facility]  Christia Reading, MD  I spent 46 minutes of time, including in depth chart review, independent review of results as outlined above, communicating results with the patient directly, face-to-face time with the patient, coordinating care, ordering studies and medications as appropriate, and documentation.

## 2023-01-03 ENCOUNTER — Other Ambulatory Visit: Payer: Self-pay | Admitting: Psychiatry

## 2023-01-03 DIAGNOSIS — F418 Other specified anxiety disorders: Secondary | ICD-10-CM

## 2023-01-03 DIAGNOSIS — F330 Major depressive disorder, recurrent, mild: Secondary | ICD-10-CM

## 2023-01-11 ENCOUNTER — Encounter: Payer: BC Managed Care – PPO | Admitting: Internal Medicine

## 2023-01-12 ENCOUNTER — Telehealth: Payer: Self-pay | Admitting: Internal Medicine

## 2023-01-12 ENCOUNTER — Other Ambulatory Visit: Payer: Self-pay

## 2023-01-12 DIAGNOSIS — R1310 Dysphagia, unspecified: Secondary | ICD-10-CM

## 2023-01-12 NOTE — Telephone Encounter (Signed)
Patient canceled the EGD due to sore throat. She has not rescheduled. Would a barium swallow be an appropriate study?

## 2023-01-12 NOTE — Telephone Encounter (Signed)
Inbound call from patient wanted to speak with a nurse in regards a Endoscopy, she want to know if she can have a Barium swallow test instead .Please advise

## 2023-01-12 NOTE — Telephone Encounter (Signed)
Barium swallow test ordered at [facility] Called the patient to discuss. No answer. Left a message advising her the test is ordered, the contact number of [facility] and to expect a call in the next few days to arrange. She may also call them directly to schedule.  She will need a follow up appointment in the next 2 months.

## 2023-01-17 ENCOUNTER — Encounter: Payer: Self-pay | Admitting: Psychiatry

## 2023-01-17 ENCOUNTER — Telehealth (INDEPENDENT_AMBULATORY_CARE_PROVIDER_SITE_OTHER): Payer: BC Managed Care – PPO | Admitting: Psychiatry

## 2023-01-17 DIAGNOSIS — F418 Other specified anxiety disorders: Secondary | ICD-10-CM

## 2023-01-17 DIAGNOSIS — F330 Major depressive disorder, recurrent, mild: Secondary | ICD-10-CM | POA: Diagnosis not present

## 2023-01-17 DIAGNOSIS — G4709 Other insomnia: Secondary | ICD-10-CM | POA: Diagnosis not present

## 2023-01-17 DIAGNOSIS — Z634 Disappearance and death of family member: Secondary | ICD-10-CM

## 2023-01-17 MED ORDER — BUPROPION HCL ER (XL) 150 MG PO TB24: 150.0000 mg | ORAL_TABLET | Freq: Every morning | ORAL | 1 refills | Status: DC

## 2023-01-17 NOTE — Progress Notes (Unsigned)
Virtual Visit via Video Note  I connected with Megan Roach on 01/17/23 at 11:00 AM EDT by a video enabled telemedicine application and verified that I am speaking with the correct person using two identifiers.  Location Provider Location : [facility] Patient Location : Home  Participants: Patient , Provider   I discussed the limitations of evaluation and management by telemedicine and the availability of in person appointments. The patient expressed understanding and agreed to proceed.   I discussed the assessment and treatment plan with the patient. The patient was provided an opportunity to ask questions and all were answered. The patient agreed with the plan and demonstrated an understanding of the instructions.   The patient was advised to call back or seek an in-person evaluation if the symptoms worsen or if the condition fails to improve as anticipated.   [facility] MD OP Progress Note  01/18/2023 12:31 PM Saja Balaguer  MRN:  [id number]  Chief Complaint:  Chief Complaint  Patient presents with   Follow-up   Medication Refill   Depression   Anxiety   Insomnia   HPI: Megan Roach is a [place] female, married, stay-at-home mom, lives in [place], has a history of other specified anxiety disorder, MDD, bereavement, thyroid disease, gastroesophageal reflux disease, history of endometriosis status post hysterectomy, migraine headaches, carpal tunnel syndrome was evaluated by telemedicine today.  Patient today reports she continues to have a lot of fatigue, lack of motivation, low energy during the day.  Has not been sleeping well at night mostly because of night sweats.  Has upcoming appointment with OB/GYN.  Estrogen patch was initially helpful but does not help anymore.  Sleep problems likely contributing to fatigue.  Continues to struggle with concentration problem, feeling overwhelmed about things she has to do around the house especially when it comes to organization.   Reports over the past few weeks she was not paying attention to getting things organized around the house especially her closet and her studio.  She reports she got so overwhelmed when it came to cleaning up however her spouse was able to step up and help her.  Patient reports she is currently taking the Lexapro 10 mg daily.  Does not believe the Lexapro was contributing to sleep issues.  Wonders if she could take it at the end of the day.  Agreeable to increasing the dose of Wellbutrin.  Patient denies any suicidality, homicidality or perceptual disturbances.  Has started psychotherapy, Ms.Rica Koyanagi, reports she meets with therapist every 2 weeks.  Reports therapy sessions as beneficial.  Patient denies any other concerns today.  Visit Diagnosis:    ICD-10-CM   1. Other specified anxiety disorders  F41.8    Limited symptom attack    2. Mild episode of recurrent major depressive disorder  F33.0 buPROPion (WELLBUTRIN XL) 150 MG 24 hr tablet    3. Bereavement  Z63.4     4. Other insomnia  G47.09    Likely due to night sweats      Past Psychiatric History: Reviewed past psychiatric history from progress note on 07/08/2022.  Past trials of venlafaxine.  Past Medical History:  Past Medical History:  Diagnosis Date   Asthma     Past Surgical History:  Procedure Laterality Date   CESAREAN SECTION     total of 3   RECTAL PROLAPSE REPAIR     TOTAL VAGINAL HYSTERECTOMY      Family Psychiatric History: Reviewed family psychiatric history from progress note on 07/08/2022  Family  History:  Family History  Problem Relation Age of Onset   Depression Mother    Asthma Mother    Bladder Cancer Father    Idiopathic pulmonary fibrosis Father    Alcohol abuse Brother    Gallbladder disease Brother    Liver disease Brother    Anxiety disorder Son    ADD / ADHD Son    Cervical cancer Other    Colon cancer Neg Hx    Colon polyps Neg Hx    Esophageal cancer Neg Hx    Stomach cancer  Neg Hx    Pancreatic cancer Neg Hx     Social History: Reviewed social history from progress note on 07/08/2022 Social History   Socioeconomic History   Marital status: Married    Spouse name: Megan Roach   Number of children: 3   Years of education: Not on file   Highest education level: Professional school degree (e.g., MD, DDS, DVM, JD)  Occupational History   Not on file  Tobacco Use   Smoking status: Never   Smokeless tobacco: Never  Vaping Use   Vaping Use: Never used  Substance and Sexual Activity   Alcohol use: Yes    Alcohol/week: 1.0 standard drink of alcohol    Types: 1 Glasses of wine per week    Comment: socially   Drug use: Never   Sexual activity: Yes  Other Topics Concern   Not on file  Social History Narrative   Lives at home with family   R handed   Caffeine: 2 C of coffee a day   Social Determinants of Health   Financial Resource Strain: Not on file  Food Insecurity: Not on file  Transportation Needs: Not on file  Physical Activity: Not on file  Stress: Not on file  Social Connections: Not on file    Allergies:  Allergies  Allergen Reactions   Sulfa Antibiotics Swelling    Metabolic Disorder Labs: Lab Results  Component Value Date   HGBA1C 5.4 12/03/2021   No results found for: "PROLACTIN" Lab Results  Component Value Date   CHOL 195 01/30/2022   TRIG 54 01/30/2022   HDL 64 01/30/2022   CHOLHDL 3.0 01/30/2022   VLDL 11 01/30/2022   LDLCALC 120 (H) 01/30/2022   LDLCALC 119 (H) 12/03/2021   Lab Results  Component Value Date   TSH 1.98 12/03/2021    Therapeutic Level Labs: No results found for: "LITHIUM" No results found for: "VALPROATE" No results found for: "CBMZ"  Current Medications: Current Outpatient Medications  Medication Sig Dispense Refill   buPROPion (WELLBUTRIN XL) 150 MG 24 hr tablet Take 1 tablet (150 mg total) by mouth in the morning. 30 tablet 1   albuterol (VENTOLIN HFA) 108 (90 Base) MCG/ACT inhaler Inhale 2  puffs into the lungs every 4 (four) hours as needed.     budesonide-formoterol (SYMBICORT) 160-4.5 MCG/ACT inhaler Inhale 2 puffs into the lungs daily.     escitalopram (LEXAPRO) 10 MG tablet TAKE 1 TABLET (10 MG TOTAL) BY MOUTH DAILY WITH BREAKFAST. 90 tablet 0   estradiol (MINIVELLE) 0.05 MG/24HR patch Apply 1 patch twice a week by transdermal route.     Magnesium 500 MG CAPS Take 1 capsule by mouth daily.     Multiple Vitamin (MULTIVITAMIN WITH MINERALS) TABS tablet Take 1 tablet by mouth daily.     pantoprazole (PROTONIX) 40 MG tablet Take 40 mg by mouth at bedtime.     rizatriptan (MAXALT) 5 MG tablet Take 1 tablet (  5 mg total) by mouth as needed for migraine. May repeat in 2 hours if needed 10 tablet 6   No current facility-administered medications for this visit.     Musculoskeletal: Strength & Muscle Tone:  UTA Gait & Station:  Seated Patient leans: N/A  Psychiatric Specialty Exam: Review of Systems  Constitutional:  Positive for fatigue.  Psychiatric/Behavioral:  Positive for sleep disturbance. The patient is nervous/anxious.   All other systems reviewed and are negative.   There were no vitals taken for this visit.There is no height or weight on file to calculate BMI.  General Appearance: Casual  Eye Contact:  Fair  Speech:  Clear and Coherent  Volume:  Normal  Mood:  Anxious  Affect:  Congruent  Thought Process:  Goal Directed and Descriptions of Associations: Intact  Orientation:  Full (Time, Place, and Person)  Thought Content: Logical   Suicidal Thoughts:  No  Homicidal Thoughts:  No  Memory:  Immediate;   Fair Recent;   Fair Remote;   Fair  Judgement:  Fair  Insight:  Fair  Psychomotor Activity:  Normal  Concentration:  Concentration: Fair and Attention Span: Fair  Recall:  [employer] of Knowledge: Fair  Language: Fair  Akathisia:  No  Handed:  Right  AIMS (if indicated): not done  Assets:  Communication Skills Desire for Improvement Housing Social  Support  ADL's:  Intact  Cognition: WNL  Sleep:  Poor due to night sweats   Screenings: GAD-7    Flowsheet Row Office Visit from 07/08/2022 in [place]  Total GAD-7 Score 9      PHQ2-9    [place] Office Visit from 07/08/2022 in [place] Office Visit from 01/06/2022 in [place] at [facility] Video Visit from 11/24/2021 in [place] at [facility] Total Score 3 2 0  PHQ-9 Total Score 13 12 0      Flowsheet Row Video Visit from 01/17/2023 in [place] Video Visit from 10/13/2022 in [place] Video Visit from 08/22/2022 in [place] No Risk No Risk No Risk        Assessment and Plan: Megan Roach is a 47 year old [place] female who is currently struggling with anxiety, sleep problems, concentration and fatigue, will continue to benefit from medication readjustment, psychotherapy sessions.  Plan Other specified anxiety disorder-limited symptom attack-some improvement Lexapro 10 mg p.o. daily Klonopin 0.5 mg as needed available. Patient has started psychotherapy sessions with Ms. Rica Koyanagi, will coordinate care.  MDD-some improvement Increase Wellbutrin to XL 150 mg p.o. daily in the morning Lexapro 10 mg p.o. daily could take it in the evening with supper if it does not affect sleep.  Bereavement-unstable Continue CBT  Insomnia-due to night sweats likely-unstable Patient has upcoming appointment with OB/GYN for management of night sweats.   Follow-up in clinic in 4 to 6 weeks or sooner if needed.   Collaboration of Care: Collaboration of Care: Other patient encouraged to follow-up with OB/GYN   Patient/Guardian was advised Release of Information must be  obtained prior to any record release in order to collaborate their care with an outside provider. Patient/Guardian was advised if they have not already done so to contact the registration department to sign all necessary forms in order for [place] to release information regarding their care.   Consent: Patient/Guardian  gives verbal consent for treatment and assignment of benefits for services provided during this visit. Patient/Guardian expressed understanding and agreed to proceed.   This note was generated in part or whole with voice recognition software. Voice recognition is usually quite accurate but there are transcription errors that can and very often do occur. I apologize for any typographical errors that were not detected and corrected.    Ursula Alert, MD 01/18/2023, 12:31 PM

## 2023-01-19 ENCOUNTER — Ambulatory Visit
Admission: RE | Admit: 2023-01-19 | Discharge: 2023-01-19 | Disposition: A | Discharge status: Home / Self Care | Payer: BC Managed Care – PPO | Source: Ambulatory Visit | Attending: Internal Medicine | Admitting: Internal Medicine

## 2023-01-19 DIAGNOSIS — R1310 Dysphagia, unspecified: Secondary | ICD-10-CM

## 2023-01-19 NOTE — Progress Notes (Signed)
Hi Megan Roach, please let the patient know that her barium swallow study showed that the barium tablet appeared to delay in the distal esophagus. This could suggest that she has an esophageal motility disorder or that she could have a mild stricture that would benefit from dilation. I would recommend that she proceed with getting an upper endoscopy. If she is amenable, please get her scheduled in the [place].

## 2023-01-20 ENCOUNTER — Telehealth: Payer: Self-pay | Admitting: Internal Medicine

## 2023-01-20 NOTE — Telephone Encounter (Signed)
Spoke with patient about her results and the recommended EGD. Patient agrees to the plan. She is available only on 4/12, 4/26, 5/3 and 5/10. She will be out of the country after that date. She is agreeable to another provider doing the EGD if you are okay with this.

## 2023-01-20 NOTE — Telephone Encounter (Signed)
Patient called regarding Barium Swallow results from yesterday 01/19/2023. She would like to go over results and next steps. Please advise.

## 2023-01-23 ENCOUNTER — Telehealth: Payer: Self-pay

## 2023-01-23 NOTE — Telephone Encounter (Signed)
Inbound call from patient returning call to schedule procedure. Please advise.  Thank you

## 2023-01-23 NOTE — Telephone Encounter (Signed)
Called the patient to offer the EGD with Dr Lavon Paganini on 02/03/23. No answer. Left information on the voicemail and asked she return the call.

## 2023-01-24 ENCOUNTER — Other Ambulatory Visit: Payer: Self-pay

## 2023-01-24 DIAGNOSIS — K219 Gastro-esophageal reflux disease without esophagitis: Secondary | ICD-10-CM

## 2023-01-24 DIAGNOSIS — R1310 Dysphagia, unspecified: Secondary | ICD-10-CM

## 2023-01-24 NOTE — Telephone Encounter (Signed)
Called the patient back. No answer. Her EGD is scheduled for 02/17/23 at 11:30 am. Arrive at 10:30 am. Requested the patient call back to confirm or decline. She will need instructions. Ambulatory referral was placed.

## 2023-01-24 NOTE — Telephone Encounter (Signed)
See telephone encounter of 01/20/23.

## 2023-01-26 ENCOUNTER — Other Ambulatory Visit: Payer: Self-pay

## 2023-01-26 DIAGNOSIS — K219 Gastro-esophageal reflux disease without esophagitis: Secondary | ICD-10-CM

## 2023-01-26 DIAGNOSIS — R1310 Dysphagia, unspecified: Secondary | ICD-10-CM

## 2023-01-26 NOTE — Telephone Encounter (Signed)
Spoke with the patient. Confirmed the date, reviewed her instructions and explained where to find the written instructions in My Chart. Ambulatory referral placed.

## 2023-02-03 ENCOUNTER — Encounter: Payer: BC Managed Care – PPO | Admitting: Gastroenterology

## 2023-02-11 ENCOUNTER — Other Ambulatory Visit: Payer: Self-pay | Admitting: Psychiatry

## 2023-02-11 DIAGNOSIS — F330 Major depressive disorder, recurrent, mild: Secondary | ICD-10-CM

## 2023-02-17 ENCOUNTER — Encounter: Payer: Self-pay | Admitting: Gastroenterology

## 2023-02-17 ENCOUNTER — Ambulatory Visit (AMBULATORY_SURGERY_CENTER): Payer: BC Managed Care – PPO | Admitting: Gastroenterology

## 2023-02-17 VITALS — BP 130/71 | HR 79 | Temp 98.4°F | Resp 16 | Ht 59.0 in | Wt 154.0 lb

## 2023-02-17 DIAGNOSIS — R1310 Dysphagia, unspecified: Secondary | ICD-10-CM | POA: Diagnosis not present

## 2023-02-17 DIAGNOSIS — R09A2 Foreign body sensation, throat: Secondary | ICD-10-CM

## 2023-02-17 DIAGNOSIS — K2970 Gastritis, unspecified, without bleeding: Secondary | ICD-10-CM

## 2023-02-17 DIAGNOSIS — K2950 Unspecified chronic gastritis without bleeding: Secondary | ICD-10-CM | POA: Diagnosis not present

## 2023-02-17 DIAGNOSIS — K229 Disease of esophagus, unspecified: Secondary | ICD-10-CM | POA: Diagnosis not present

## 2023-02-17 MED ORDER — SUCRALFATE 1 GM/10ML PO SUSP
1.0000 g | Freq: Four times a day (QID) | ORAL | 0 refills | Status: AC
Start: 2023-02-17 — End: 2023-03-03

## 2023-02-17 MED ORDER — SODIUM CHLORIDE 0.9 % IV SOLN: 500.0000 mL | INTRAVENOUS | Status: DC

## 2023-02-17 MED ORDER — LIDOCAINE VISCOUS HCL 2 % MT SOLN
5.0000 mL | OROMUCOSAL | 0 refills | Status: AC | PRN
Start: 2023-02-17 — End: 2023-02-24

## 2023-02-17 NOTE — Progress Notes (Unsigned)
Uneventful anesthetic. Report to pacu rn. Vss. Care resumed by rn. 

## 2023-02-17 NOTE — Progress Notes (Unsigned)
[facility] History and Physical   Primary Care Physician:  Mosetta Putt, MD   Reason for Procedure:  Dysphagia  Plan:    EGD  with possible interventions as needed     HPI: Megan Roach is a very pleasant 47 y.o. female here for EGD for dysphagia.  The risks and benefits as well as alternatives of endoscopic procedure(s) have been discussed and reviewed. All questions answered. The patient agrees to proceed.    Past Medical History:  Diagnosis Date   Asthma     Past Surgical History:  Procedure Laterality Date   CESAREAN SECTION     total of 3   RECTAL PROLAPSE REPAIR     TOTAL VAGINAL HYSTERECTOMY      Prior to Admission medications   Medication Sig Start Date End Date Taking? Authorizing Provider  buPROPion (WELLBUTRIN XL) 150 MG 24 hr tablet TAKE 1 TABLET (150 MG TOTAL) BY MOUTH IN THE MORNING 02/13/23  Yes Eappen, Saramma, MD  escitalopram (LEXAPRO) 10 MG tablet TAKE 1 TABLET (10 MG TOTAL) BY MOUTH DAILY WITH BREAKFAST. 01/03/23  Yes Jomarie Longs, MD  estradiol (MINIVELLE) 0.05 MG/24HR patch Apply 1 patch twice a week by transdermal route. 05/18/22  Yes [provider]  Magnesium 500 MG CAPS Take 1 capsule by mouth daily.   Yes [provider]  Multiple Vitamin (MULTIVITAMIN WITH MINERALS) TABS tablet Take 1 tablet by mouth daily.   Yes [provider]  pantoprazole (PROTONIX) 40 MG tablet Take 40 mg by mouth at bedtime. 11/14/22  Yes [provider]  albuterol (VENTOLIN HFA) 108 (90 Base) MCG/ACT inhaler Inhale 2 puffs into the lungs every 4 (four) hours as needed. 11/02/22   [provider]  budesonide-formoterol (SYMBICORT) 160-4.5 MCG/ACT inhaler Inhale 2 puffs into the lungs daily. 11/30/22   [provider]  rizatriptan (MAXALT) 5 MG tablet Take 1 tablet (5 mg total) by mouth as needed for migraine. May repeat in 2 hours if needed Patient not taking: Reported on 02/17/2023 03/11/22   Ocie Doyne, MD    Current Outpatient Medications  Medication Sig Dispense Refill   buPROPion (WELLBUTRIN XL) 150 MG 24 hr tablet TAKE 1 TABLET (150 MG TOTAL) BY MOUTH IN THE MORNING 90 tablet 0   escitalopram (LEXAPRO) 10 MG tablet TAKE 1 TABLET (10 MG TOTAL) BY MOUTH DAILY WITH BREAKFAST. 90 tablet 0   estradiol (MINIVELLE) 0.05 MG/24HR patch Apply 1 patch twice a week by transdermal route.     Magnesium 500 MG CAPS Take 1 capsule by mouth daily.     Multiple Vitamin (MULTIVITAMIN WITH MINERALS) TABS tablet Take 1 tablet by mouth daily.     pantoprazole (PROTONIX) 40 MG tablet Take 40 mg by mouth at bedtime.     albuterol (VENTOLIN HFA) 108 (90 Base) MCG/ACT inhaler Inhale 2 puffs into the lungs every 4 (four) hours as needed.     budesonide-formoterol (SYMBICORT) 160-4.5 MCG/ACT inhaler Inhale 2 puffs into the lungs daily.     rizatriptan (MAXALT) 5 MG tablet Take 1 tablet (5 mg total) by mouth as needed for migraine. May repeat in 2 hours if needed (Patient not taking: Reported on 02/17/2023) 10 tablet 6   Current Facility-Administered Medications  Medication Dose Route Frequency Provider Last Rate Last Admin   0.9 %  sodium chloride infusion  500 mL Intravenous Continuous Napoleon Form, MD        Allergies as of 02/17/2023 - Review Complete 02/17/2023  Allergen Reaction Noted  Sulfa antibiotics Swelling 12/07/2020    Family History  Problem Relation Age of Onset   Depression Mother    Asthma Mother    Bladder Cancer Father    Idiopathic pulmonary fibrosis Father    Alcohol abuse Brother    Gallbladder disease Brother    Liver disease Brother    Anxiety disorder Son    ADD / ADHD Son    Cervical cancer Other    Colon cancer Neg Hx    Colon polyps Neg Hx    Esophageal cancer Neg Hx    Stomach cancer Neg Hx    Pancreatic cancer Neg Hx    Rectal cancer Neg Hx     Social History   Socioeconomic History   Marital status: Married    Spouse name: sugith   Number of  children: 3   Years of education: Not on file   Highest education level: Professional school degree (e.g., MD, DDS, DVM, JD)  Occupational History   Not on file  Tobacco Use   Smoking status: Never   Smokeless tobacco: Never  Vaping Use   Vaping Use: Never used  Substance and Sexual Activity   Alcohol use: Yes    Alcohol/week: 1.0 standard drink of alcohol    Types: 1 Glasses of wine per week    Comment: socially   Drug use: Never   Sexual activity: Yes  Other Topics Concern   Not on file  Social History Narrative   Lives at home with family   R handed   Caffeine: 2 C of coffee a day   Social Determinants of Corporate investment banker Strain: Not on file  Food Insecurity: Not on file  Transportation Needs: Not on file  Physical Activity: Not on file  Stress: Not on file  Social Connections: Not on file  Intimate Partner Violence: Not on file    Review of Systems:  All other review of systems negative except as mentioned in the HPI.  Physical Exam: Vital signs in last 24 hours: Blood Pressure 118/78   Pulse 82   Temperature 98.4 F (36.9 C) (Temporal)   Height 4\' 11"  (1.499 m)   Weight 154 lb (69.9 kg)   Oxygen Saturation 100%   Body Mass Index 31.10 kg/m  General:   Alert, NAD Lungs:  Clear .   Heart:  Regular rate and rhythm Abdomen:  Soft, nontender and nondistended. Neuro/Psych:  Alert and cooperative. Normal mood and affect. A and O x 3  Reviewed labs, radiology imaging, old records and pertinent past GI work up  Patient is appropriate for planned procedure(s) and anesthesia in an ambulatory setting   K. Scherry Ran , MD [phone]

## 2023-02-17 NOTE — Patient Instructions (Signed)
YOU HAD AN ENDOSCOPIC PROCEDURE TODAY AT THE [facility]:   Refer to the procedure report that was given to you for any specific questions about what was found during the examination.  If the procedure report does not answer your questions, please call your gastroenterologist to clarify.  If you requested that your care partner not be given the details of your procedure findings, then the procedure report has been included in a sealed envelope for you to review at your convenience later.  **Handout given on Gastritis**  YOU SHOULD EXPECT: Some feelings of bloating in the abdomen. Passage of more gas than usual.  Walking can help get rid of the air that was put into your GI tract during the procedure and reduce the bloating. If you had a lower endoscopy (such as a colonoscopy or flexible sigmoidoscopy) you may notice spotting of blood in your stool or on the toilet paper. If you underwent a bowel prep for your procedure, you may not have a normal bowel movement for a few days.  Please Note:  You might notice some irritation and congestion in your nose or some drainage.  This is from the oxygen used during your procedure.  There is no need for concern and it should clear up in a day or so.  SYMPTOMS TO REPORT IMMEDIATELY:  Following upper endoscopy (EGD)  Vomiting of blood or coffee ground material  New chest pain or pain under the shoulder blades  Painful or persistently difficult swallowing  New shortness of breath  Fever of 100F or higher  Black, tarry-looking stools  For urgent or emergent issues, a gastroenterologist can be reached at any hour by calling [phone]. Do not use MyChart messaging for urgent concerns.    DIET:  We do recommend a small meal at first, but then you may proceed to your regular diet.  Drink plenty of fluids but you should avoid alcoholic beverages for 24 hours.  ACTIVITY:  You should plan to take it easy for the rest of today and you should NOT  DRIVE or use heavy machinery until tomorrow (because of the sedation medicines used during the test).    FOLLOW UP: Appointment made for Dr. Leonides Schanz on 05/24/23 at 9:10 am  Our staff will call the number listed on your records the next business day following your procedure.  We will call around 7:15- 8:00 am to check on you and address any questions or concerns that you may have regarding the information given to you following your procedure. If we do not reach you, we will leave a message.     If any biopsies were taken you will be contacted by phone or by letter within the next 1-3 weeks.  Please call us at [phone] if you have not heard about the biopsies in 3 weeks.    SIGNATURES/CONFIDENTIALITY: You and/or your care partner have signed paperwork which will be entered into your electronic medical record.  These signatures attest to the fact that that the information above on your After Visit Summary has been reviewed and is understood.  Full responsibility of the confidentiality of this discharge information lies with you and/or your care-partner.

## 2023-02-17 NOTE — Op Note (Signed)
[facility] Patient Name: Megan Roach Procedure Date: 02/17/2023 12:01 PM MRN: [id number] Endoscopist: Napoleon Form , MD, [id number] Age: 47 Referring MD:  Date of Birth: 01-Nov-1975 Gender: Female Account #: [id number] Procedure:                Upper GI endoscopy Indications:              Dysphagia, Globus sensation, abnormal barium                            esophagogram Medicines:                Monitored Anesthesia Care, Propofol per Anesthesia Procedure:                Pre-Anesthesia Assessment:                           - Prior to the procedure, a History and Physical                            was performed, and patient medications and                            allergies were reviewed. The patient's tolerance of                            previous anesthesia was also reviewed. The risks                            and benefits of the procedure and the sedation                            options and risks were discussed with the patient.                            All questions were answered, and informed consent                            was obtained. Prior Anticoagulants: The patient has                            taken no anticoagulant or antiplatelet agents. ASA                            Grade Assessment: II - A patient with mild systemic                            disease. After reviewing the risks and benefits,                            the patient was deemed in satisfactory condition to                            undergo the procedure.  After obtaining informed consent, the endoscope was                            passed under direct vision. Throughout the                            procedure, the patient's blood pressure, pulse, and                            oxygen saturations were monitored continuously. The                            GIF [id number] #[id number] was introduced through the                            mouth, and  advanced to the second part of duodenum.                            The upper GI endoscopy was accomplished without                            difficulty. The patient tolerated the procedure                            well. Scope In: Scope Out: Findings:                 The Z-line was regular and was found 38 cm from the                            incisors.                           No endoscopic abnormality was evident in the                            esophagus to explain the patient's complaint of                            dysphagia. It was decided, however, to proceed with                            dilation of the entire esophagus. The scope was                            withdrawn. Dilation was performed with a Maloney                            dilator with no resistance at 52 Fr. The dilation                            site was examined following endoscope reinsertion  and showed mild mucosal disruption in the proximal                            esophagus near upper esophageal sphincter.                            Estimated blood loss was minimal. Biopsies were                            obtained from the proximal and distal esophagus                            with cold forceps for histology of suspected                            eosinophilic esophagitis.                           Patchy mild inflammation characterized by                            congestion (edema) and erythema was found in the                            entire examined stomach. Biopsies were taken with a                            cold forceps for Helicobacter pylori testing.                           The cardia and gastric fundus were normal on                            retroflexion.                           The examined duodenum was normal. Complications:            No immediate complications. Estimated Blood Loss:     Estimated blood loss was minimal. Impression:                - Z-line regular, 38 cm from the incisors.                           - No endoscopic esophageal abnormality to explain                            patient's dysphagia. Esophagus dilated. Dilated.                           - Gastritis. Biopsied.                           - Normal examined duodenum.                           -  Biopsies were taken with a cold forceps for                            evaluation of eosinophilic esophagitis. Recommendation:           - Resume previous diet.                           - Continue present medications.                           - Await pathology results.                           - Use Protonix (pantoprazole) 40 mg PO daily.                           - Use sucralfate suspension 1 gram PO QID for 2                            weeks.                           - Use Viscous Lidocaine at 2% 5 mL PO q 4 hrs as                            needed for 1 week.                           - Follow an antireflux regimen.                           - Return to GI office at the next available                            appointment with Dr.Dorsey in 1-2 months. Napoleon Form, MD 02/17/2023 12:29:54 PM This report has been signed electronically.

## 2023-02-17 NOTE — Progress Notes (Unsigned)
Called to room to assist during endoscopic procedure.  Patient ID and intended procedure confirmed with present staff. Received instructions for my participation in the procedure from the performing physician.  

## 2023-02-20 ENCOUNTER — Telehealth: Payer: Self-pay | Admitting: *Deleted

## 2023-02-20 NOTE — Telephone Encounter (Signed)
Left message on f/u call 

## 2023-02-23 ENCOUNTER — Telehealth (INDEPENDENT_AMBULATORY_CARE_PROVIDER_SITE_OTHER): Payer: BC Managed Care – PPO | Admitting: Psychiatry

## 2023-02-23 ENCOUNTER — Encounter: Payer: Self-pay | Admitting: Psychiatry

## 2023-02-23 DIAGNOSIS — Z634 Disappearance and death of family member: Secondary | ICD-10-CM

## 2023-02-23 DIAGNOSIS — F418 Other specified anxiety disorders: Secondary | ICD-10-CM

## 2023-02-23 DIAGNOSIS — F3342 Major depressive disorder, recurrent, in full remission: Secondary | ICD-10-CM

## 2023-02-23 DIAGNOSIS — F330 Major depressive disorder, recurrent, mild: Secondary | ICD-10-CM

## 2023-02-23 DIAGNOSIS — G4709 Other insomnia: Secondary | ICD-10-CM

## 2023-02-23 NOTE — Progress Notes (Signed)
Virtual Visit via Video Note  I connected with Megan Roach on 02/23/23 at  4:30 PM EDT by a video enabled telemedicine application and verified that I am speaking with the correct person using two identifiers.  Location Provider Location : [facility] Patient Location : Home  Participants: Patient , Provider   I discussed the limitations of evaluation and management by telemedicine and the availability of in person appointments. The patient expressed understanding and agreed to proceed.   I discussed the assessment and treatment plan with the patient. The patient was provided an opportunity to ask questions and all were answered. The patient agreed with the plan and demonstrated an understanding of the instructions.   The patient was advised to call back or seek an in-person evaluation if the symptoms worsen or if the condition fails to improve as anticipated.   [facility] MD OP Progress Note  02/24/2023 12:53 PM Megan Roach  MRN:  [id number]  Chief Complaint:  Chief Complaint  Patient presents with   Follow-up   Anxiety   Depression   Medication Refill   HPI: Megan Roach is a 47 year old Asian [place] female married, stay-at-home mom, lives in [place], has a history of other specified anxiety disorder, major depression, insomnia, was evaluated by telemedicine today.  Patient reports since the past 1 week or so she has noticed she is more energetic and motivated to do things.  She reports she has been staying busy doing a lot of things prior to her upcoming trip out of the country.  That also seems to help.  She reports at the end of the day she feels tired and she is sleeping better than before.  Although she is sleeping better she reports she gets only around 4 hours of sleep.  She falls asleep at around 10 PM and wakes up at around 3 AM.  She reports she does snore.  She does feel tired and dozes off during the day.  Agreeable to referral for sleep consultation.  Continues to  have night sweats however that has improved.  Currently has a couple of times a week only.  Had an OB/GYN consultation and it was suggested likely perimenopausal.  Patient currently is compliant on the Wellbutrin, Lexapro.  Patient denies any concerns or side effects.  Currently compliant on psychotherapy sessions with Ms. Sander Radon.  Reports therapy sessions are beneficial.  Patient denies any suicidality, homicidality or perceptual disturbances.  Patient denies any other concerns today.  Visit Diagnosis:    ICD-10-CM   1. Other specified anxiety disorders  F41.8    With limited symptom attacks    2. MDD (major depressive disorder), recurrent, in full remission (HCC)  F33.42     3. Bereavement  Z63.4     4. Other insomnia  G47.09 Ambulatory referral to Pulmonology   Rule out sleep apnea, also has perimenopausal night sweats      Past Psychiatric History: Reviewed past psychiatric history from progress note on 07/08/2022.  Past Medical History:  Past Medical History:  Diagnosis Date   Asthma     Past Surgical History:  Procedure Laterality Date   CESAREAN SECTION     total of 3   RECTAL PROLAPSE REPAIR     TOTAL VAGINAL HYSTERECTOMY      Family Psychiatric History: Reviewed family psychiatric history from progress note on 07/08/2022.  Family History:  Family History  Problem Relation Age of Onset   Depression Mother    Asthma Mother    Bladder Cancer  Father    Idiopathic pulmonary fibrosis Father    Alcohol abuse Brother    Gallbladder disease Brother    Liver disease Brother    Anxiety disorder Son    ADD / ADHD Son    Cervical cancer Other    Colon cancer Neg Hx    Colon polyps Neg Hx    Esophageal cancer Neg Hx    Stomach cancer Neg Hx    Pancreatic cancer Neg Hx    Rectal cancer Neg Hx     Social History: Reviewed social history from progress note on 07/08/2022. Social History   Socioeconomic History   Marital status: Married    Spouse name:  sugith   Number of children: 3   Years of education: Not on file   Highest education level: Professional school degree (e.g., MD, DDS, DVM, JD)  Occupational History   Not on file  Tobacco Use   Smoking status: Never   Smokeless tobacco: Never  Vaping Use   Vaping Use: Never used  Substance and Sexual Activity   Alcohol use: Yes    Alcohol/week: 1.0 standard drink of alcohol    Types: 1 Glasses of wine per week    Comment: socially   Drug use: Never   Sexual activity: Yes  Other Topics Concern   Not on file  Social History Narrative   Lives at home with family   R handed   Caffeine: 2 C of coffee a day   Social Determinants of Health   Financial Resource Strain: Not on file  Food Insecurity: Not on file  Transportation Needs: Not on file  Physical Activity: Not on file  Stress: Not on file  Social Connections: Not on file    Allergies:  Allergies  Allergen Reactions   Sulfa Antibiotics Swelling    Metabolic Disorder Labs: Lab Results  Component Value Date   HGBA1C 5.4 12/03/2021   No results found for: "PROLACTIN" Lab Results  Component Value Date   CHOL 195 01/30/2022   TRIG 54 01/30/2022   HDL 64 01/30/2022   CHOLHDL 3.0 01/30/2022   VLDL 11 01/30/2022   LDLCALC 120 (H) 01/30/2022   LDLCALC 119 (H) 12/03/2021   Lab Results  Component Value Date   TSH 1.98 12/03/2021    Therapeutic Level Labs: No results found for: "LITHIUM" No results found for: "VALPROATE" No results found for: "CBMZ"  Current Medications: Current Outpatient Medications  Medication Sig Dispense Refill   albuterol (VENTOLIN HFA) 108 (90 Base) MCG/ACT inhaler Inhale 2 puffs into the lungs every 4 (four) hours as needed.     budesonide-formoterol (SYMBICORT) 160-4.5 MCG/ACT inhaler Inhale 2 puffs into the lungs daily.     buPROPion (WELLBUTRIN XL) 150 MG 24 hr tablet TAKE 1 TABLET (150 MG TOTAL) BY MOUTH IN THE MORNING 90 tablet 0   escitalopram (LEXAPRO) 10 MG tablet TAKE 1  TABLET (10 MG TOTAL) BY MOUTH DAILY WITH BREAKFAST. 90 tablet 0   estradiol (MINIVELLE) 0.05 MG/24HR patch Apply 1 patch twice a week by transdermal route.     Magnesium 500 MG CAPS Take 1 capsule by mouth daily.     Multiple Vitamin (MULTIVITAMIN WITH MINERALS) TABS tablet Take 1 tablet by mouth daily.     pantoprazole (PROTONIX) 40 MG tablet Take 40 mg by mouth at bedtime.     rizatriptan (MAXALT) 5 MG tablet Take 1 tablet (5 mg total) by mouth as needed for migraine. May repeat in 2 hours if needed 10  tablet 6   sucralfate (CARAFATE) 1 GM/10ML suspension Take 10 mLs (1 g total) by mouth 4 (four) times daily for 14 days. 560 mL 0   lidocaine (XYLOCAINE) 2 % solution Use as directed 5 mLs in the mouth or throat every 4 (four) hours as needed for up to 7 days for mouth pain. (Patient not taking: Reported on 02/23/2023) 210 mL 0   No current facility-administered medications for this visit.     Musculoskeletal: Strength & Muscle Tone:  UTA Gait & Station:  Seated Patient leans: N/A  Psychiatric Specialty Exam: Review of Systems  Endocrine:       Night sweats-improving  Psychiatric/Behavioral:  Positive for sleep disturbance.   All other systems reviewed and are negative.   There were no vitals taken for this visit.There is no height or weight on file to calculate BMI.  General Appearance: Casual  Eye Contact:  Fair  Speech:  Normal Rate  Volume:  Normal  Mood:  Euthymic  Affect:  Congruent  Thought Process:  Goal Directed and Descriptions of Associations: Intact  Orientation:  Full (Time, Place, and Person)  Thought Content: Logical   Suicidal Thoughts:  No  Homicidal Thoughts:  No  Memory:  Immediate;   Fair Recent;   Fair Remote;   Fair  Judgement:  Fair  Insight:  Fair  Psychomotor Activity:  Normal  Concentration:  Concentration: Fair and Attention Span: Fair  Recall:  [employer] of Knowledge: Fair  Language: Fair  Akathisia:  No  Handed:  Right  AIMS (if indicated):  not done  Assets:  Communication Skills Desire for Improvement Housing Social Support  ADL's:  Intact  Cognition: WNL  Sleep:   Poor, hypersomnia during the day   Screenings: GAD-7    Flowsheet Row Office Visit from 07/08/2022 in [facility]  Total GAD-7 Score 9      PHQ2-9    Flowsheet Row Video Visit from 02/23/2023 in [facility] Office Visit from 07/08/2022 in [facility] Office Visit from 01/06/2022 in [facility] Video Visit from 11/24/2021 in [facility] Total Score 0 3 2 0  PHQ-9 Total Score 4 13 12  0      Flowsheet Row Video Visit from 01/17/2023 in [facility] Video Visit from 10/13/2022 in [facility] Video Visit from 08/22/2022 in [facility]  C-SSRS RISK CATEGORY No Risk No Risk No Risk        Assessment and Plan: Megan Roach is a 47 year old Asian [place] female who is currently struggling with anxiety, sleep problems, depression was evaluated by telemedicine today.  Patient with improvement with regards to her mood although she continues to have sleep problems, will benefit from following plan.  Plan Other specified anxiety disorder-limited symptom attack-improving Lexapro 10 mg p.o. daily Klonopin 0.5 mg as needed for severe anxiety attacks Continue psychotherapy sessions with Ms. Sander Radon patient advised to sign an ROI to coordinate care.  MDD-in remission Wellbutrin XL 150 mg p.o. daily Lexapro 10 mg p.o. daily  Bereavement-improving Continue CBT  Insomnia-some improvement Patient also with hypersomnia-ambulatory referral to pulmonology. Patient reports night sweats is improving-recent OB/GYN consultation completed-likely  perimenopausal.  Follow-up in clinic in 3 months or sooner if needed in person.   Collaboration of Care: Collaboration of Care: Referral or  follow-up with counselor/therapist AEB patient advised to sign an ROI to coordinate care with counselor/therapist  Patient/Guardian was advised Release of Information must be obtained prior to any record release in order to collaborate their care with an outside provider. Patient/Guardian was advised if they have not already done so to contact the registration department to sign all necessary forms in order for [place] to release information regarding their care.   Consent: Patient/Guardian gives verbal consent for treatment and assignment of benefits for services provided during this visit. Patient/Guardian expressed understanding and agreed to proceed.   This note was generated in part or whole with voice recognition software. Voice recognition is usually quite accurate but there are transcription errors that can and very often do occur. I apologize for any typographical errors that were not detected and corrected.    Jomarie Longs, MD 02/24/2023, 12:53 PM

## 2023-02-24 ENCOUNTER — Encounter: Payer: Self-pay | Admitting: Gastroenterology

## 2023-03-02 ENCOUNTER — Encounter (HOSPITAL_BASED_OUTPATIENT_CLINIC_OR_DEPARTMENT_OTHER): Payer: Self-pay

## 2023-03-09 ENCOUNTER — Encounter (HOSPITAL_BASED_OUTPATIENT_CLINIC_OR_DEPARTMENT_OTHER): Payer: Self-pay

## 2023-04-09 ENCOUNTER — Other Ambulatory Visit: Payer: Self-pay | Admitting: Psychiatry

## 2023-04-09 DIAGNOSIS — F418 Other specified anxiety disorders: Secondary | ICD-10-CM

## 2023-04-09 DIAGNOSIS — F330 Major depressive disorder, recurrent, mild: Secondary | ICD-10-CM

## 2023-05-15 ENCOUNTER — Other Ambulatory Visit: Payer: Self-pay | Admitting: Psychiatry

## 2023-05-15 DIAGNOSIS — F330 Major depressive disorder, recurrent, mild: Secondary | ICD-10-CM

## 2023-05-24 ENCOUNTER — Ambulatory Visit: Payer: BC Managed Care – PPO | Admitting: Internal Medicine

## 2023-06-26 ENCOUNTER — Ambulatory Visit: Payer: BC Managed Care – PPO | Admitting: Psychiatry

## 2023-06-26 ENCOUNTER — Telehealth: Payer: Self-pay | Admitting: Psychiatry

## 2023-06-26 DIAGNOSIS — F330 Major depressive disorder, recurrent, mild: Secondary | ICD-10-CM

## 2023-06-26 DIAGNOSIS — F418 Other specified anxiety disorders: Secondary | ICD-10-CM

## 2023-06-26 MED ORDER — BUPROPION HCL ER (XL) 150 MG PO TB24
150.0000 mg | ORAL_TABLET | Freq: Every morning | ORAL | 0 refills | Status: DC
Start: 2023-06-26 — End: 2023-12-01

## 2023-06-26 MED ORDER — ESCITALOPRAM OXALATE 10 MG PO TABS: 10.0000 mg | ORAL_TABLET | Freq: Every day | ORAL | 0 refills | Status: DC

## 2023-06-26 NOTE — Telephone Encounter (Signed)
I have sent medication refills to pharmacy.

## 2023-06-26 NOTE — Telephone Encounter (Signed)
Patient missed her appointment but rescheduled to 07/18/23. Needs refill on all of her medications.

## 2023-07-18 ENCOUNTER — Ambulatory Visit (INDEPENDENT_AMBULATORY_CARE_PROVIDER_SITE_OTHER): Payer: BC Managed Care – PPO | Admitting: Psychiatry

## 2023-07-18 ENCOUNTER — Encounter: Payer: Self-pay | Admitting: Psychiatry

## 2023-07-18 VITALS — BP 124/85 | HR 89 | Temp 97.7°F | Ht 59.0 in | Wt 156.4 lb

## 2023-07-18 DIAGNOSIS — G4709 Other insomnia: Secondary | ICD-10-CM | POA: Diagnosis not present

## 2023-07-18 DIAGNOSIS — G4710 Hypersomnia, unspecified: Secondary | ICD-10-CM | POA: Insufficient documentation

## 2023-07-18 DIAGNOSIS — F331 Major depressive disorder, recurrent, moderate: Secondary | ICD-10-CM | POA: Diagnosis not present

## 2023-07-18 DIAGNOSIS — F418 Other specified anxiety disorders: Secondary | ICD-10-CM

## 2023-07-18 MED ORDER — ESCITALOPRAM OXALATE 10 MG PO TABS: 15.0000 mg | ORAL_TABLET | Freq: Every day | ORAL | 0 refills | Status: DC

## 2023-07-18 MED ORDER — TRAZODONE HCL 50 MG PO TABS
25.0000 mg | ORAL_TABLET | Freq: Every evening | ORAL | 1 refills | Status: DC | PRN
Start: 2023-07-18 — End: 2023-10-10

## 2023-07-18 NOTE — Progress Notes (Signed)
BH MD OP Progress Note  07/18/2023 2:36 PM Megan Roach  MRN:  [id number]  Chief Complaint:  Chief Complaint  Patient presents with   Follow-up   Depression   Anxiety   Medication Refill   HPI: Megan Roach is a 47 year old [place] female, married, stay-at-home mother, lives in [place], has a history of other specified anxiety disorder, MDD, insomnia, fatigue, hypersomnia was evaluated in office today.  Patient today reports she currently has a lot going on.  Her brother was diagnosed with pancreatic cancer and she has been going back and forth from [place] to [place] to support him.  She reports her mother is elderly and is of no help at this time.  That has been frustrating as well.  She also reports her 2 daughters are currently in college and her husband is traveling most of the time for work.  She hence is at home a lot alone with her younger son.  Patient reports she hence struggles with a lot of racing thoughts, has low interest in doing things and has to push herself, struggles with feeling tired a lot, reduced appetite, trouble concentrating, feeling bad about herself.  Patient reports her symptoms as getting worse since the past several weeks.  She continues to struggle with sleep.  Patient reports she has difficulty falling asleep as well as sleep is interrupted.  Usually sleep is interrupted due to a lot of racing thoughts and worrying.  She reports when her husband is home on the weekends she is able to sleep better.  She also struggles with a lot of fatigue during the day.  She reports excessive sleepiness during the day as well.  Patient does report a history of snoring.  She also has a history of hot flashes and  was started on estrogen patch by her OB/GYN.  That may be helpful to some extent.  Patient was referred to pulmonology for a sleep study previously however has not followed through.  She reports that she was traveling back and forth a lot she could not return their  call.  She is interested in another referral.  Patient denies any current suicidality, homicidality or perceptual disturbances.  Patient is currently following up with her therapist Ms. Megan Roach and therapy sessions are beneficial.  Patient is compliant on the Lexapro.  Reports she continues to take the Wellbutrin at night although it was prescribed to be taken during the day.  Which likely also could be affecting sleep.  Patient denies any other concerns today.  Visit Diagnosis:    ICD-10-CM   1. Other specified anxiety disorders  F41.8 escitalopram (LEXAPRO) 10 MG tablet   Limited symptom attack    2. Moderate episode of recurrent major depressive disorder (HCC)  F33.1 escitalopram (LEXAPRO) 10 MG tablet    3. Other insomnia  G47.09 traZODone (DESYREL) 50 MG tablet    Ambulatory referral to Pulmonology   Rule out sleep apnea as well as perimenopausal symptoms likely affecting sleep.    4. Hypersomnia  G47.10 Ambulatory referral to Pulmonology      Past Psychiatric History: I have reviewed past psychiatric history from progress note on 07/08/2022.  Past Medical History:  Past Medical History:  Diagnosis Date   Asthma     Past Surgical History:  Procedure Laterality Date   CESAREAN SECTION     total of 3   RECTAL PROLAPSE REPAIR     TOTAL VAGINAL HYSTERECTOMY      Family Psychiatric History: I have  reviewed family psychiatric history from progress note on 07/08/2022.  Family History:  Family History  Problem Relation Age of Onset   Depression Mother    Asthma Mother    Bladder Cancer Father    Idiopathic pulmonary fibrosis Father    Alcohol abuse Brother    Gallbladder disease Brother    Liver disease Brother    Anxiety disorder Son    ADD / ADHD Son    Cervical cancer Other    Colon cancer Neg Hx    Colon polyps Neg Hx    Esophageal cancer Neg Hx    Stomach cancer Neg Hx    Pancreatic cancer Neg Hx    Rectal cancer Neg Hx     Social History: I have  reviewed social history from progress note on 07/08/2022. Social History   Socioeconomic History   Marital status: Married    Spouse name: sugith   Number of children: 3   Years of education: Not on file   Highest education level: Professional school degree (e.g., MD, DDS, DVM, JD)  Occupational History   Not on file  Tobacco Use   Smoking status: Never   Smokeless tobacco: Never  Vaping Use   Vaping status: Never Used  Substance and Sexual Activity   Alcohol use: Yes    Alcohol/week: 1.0 standard drink of alcohol    Types: 1 Glasses of wine per week    Comment: socially   Drug use: Never   Sexual activity: Yes  Other Topics Concern   Not on file  Social History Narrative   Lives at home with family   R handed   Caffeine: 2 C of coffee a day   Social Determinants of Health   Financial Resource Strain: Not on file  Food Insecurity: No Food Insecurity (12/07/2020)   Received from [facility], [facility]   Hunger Vital Sign    Worried About Running Out of Food in the Last Year: Never true    Ran Out of Food in the Last Year: Never true  Transportation Needs: Not on file  Physical Activity: Inactive (12/07/2020)   [facility], [facility]   Exercise Vital Sign    Days of Exercise per Week: 0 days    Minutes of Exercise per Session: 0 min  Stress: No Stress Concern Present (12/07/2020)   Received from [facility], [facility] - Occupational Stress Questionnaire    Feeling of Stress : Not at all  Social Connections: Unknown (03/01/2022)   Received from [facility], [facility]   Social Network    Social Network: Not on file    Allergies:  Allergies  Allergen Reactions   Sulfa Antibiotics Swelling    Metabolic Disorder Labs: Lab Results  Component Value Date   HGBA1C 5.4 12/03/2021   No results found for: "PROLACTIN" Lab Results  Component Value Date   CHOL 195 01/30/2022   TRIG 54  01/30/2022   HDL 64 01/30/2022   CHOLHDL 3.0 01/30/2022   VLDL 11 01/30/2022   LDLCALC 120 (H) 01/30/2022   LDLCALC 119 (H) 12/03/2021   Lab Results  Component Value Date   TSH 1.98 12/03/2021    Therapeutic Level Labs: No results found for: "LITHIUM" No results found for: "VALPROATE" No results found for: "CBMZ"  Current Medications: Current Outpatient Medications  Medication Sig Dispense Refill   albuterol (VENTOLIN HFA) 108 (90 Base) MCG/ACT inhaler Inhale 2 puffs into the lungs  every 4 (four) hours as needed.     budesonide-formoterol (SYMBICORT) 160-4.5 MCG/ACT inhaler Inhale 2 puffs into the lungs daily.     buPROPion (WELLBUTRIN XL) 150 MG 24 hr tablet Take 1 tablet (150 mg total) by mouth in the morning. 90 tablet 0   estradiol (MINIVELLE) 0.05 MG/24HR patch Apply 1 patch twice a week by transdermal route.     Magnesium 500 MG CAPS Take 1 capsule by mouth daily.     Multiple Vitamin (MULTIVITAMIN WITH MINERALS) TABS tablet Take 1 tablet by mouth daily.     pantoprazole (PROTONIX) 40 MG tablet Take 40 mg by mouth at bedtime.     rizatriptan (MAXALT) 5 MG tablet Take 1 tablet (5 mg total) by mouth as needed for migraine. May repeat in 2 hours if needed 10 tablet 6   traZODone (DESYREL) 50 MG tablet Take 0.5 tablets (25 mg total) by mouth at bedtime as needed for sleep. 15 tablet 1   escitalopram (LEXAPRO) 10 MG tablet Take 1.5 tablets (15 mg total) by mouth daily with breakfast. 135 tablet 0   No current facility-administered medications for this visit.     Musculoskeletal: Strength & Muscle Tone: within normal limits Gait & Station: normal Patient leans: N/A  Psychiatric Specialty Exam: Review of Systems  Constitutional:  Positive for fatigue.  Psychiatric/Behavioral:  Positive for decreased concentration, dysphoric mood and sleep disturbance. The patient is nervous/anxious.     Blood pressure 124/85, pulse 89, temperature 97.7 F (36.5 C), temperature source  Skin, height 4\' 11"  (1.499 m), weight 156 lb 6.4 oz (70.9 kg).Body mass index is 31.59 kg/m.  General Appearance: Fairly Groomed  Eye Contact:  Fair  Speech:  Clear and Coherent  Volume:  Normal  Mood:  Anxious and Depressed  Affect:  Appropriate  Thought Process:  Goal Directed and Descriptions of Associations: Intact  Orientation:  Full (Time, Place, and Person)  Thought Content: Logical   Suicidal Thoughts:  No  Homicidal Thoughts:  No  Memory:  Immediate;   Fair Recent;   Fair Remote;   Fair  Judgement:  Fair  Insight:  Fair  Psychomotor Activity:  Normal  Concentration:  Concentration: Fair and Attention Span: Fair  Recall:  [employer] of Knowledge: Fair  Language: Fair  Akathisia:  No  Handed:  Right  AIMS (if indicated): not done  Assets:  Communication Skills Desire for Improvement Housing Intimacy Social Support Talents/Skills Transportation  ADL's:  Intact  Cognition: WNL  Sleep:  Poor   Screenings: GAD-7    Loss adjuster, chartered Office Visit from 07/18/2023 in [facility] Office Visit from 07/08/2022 in [facility]  Total GAD-7 Score 14 9      PHQ2-9    Flowsheet Row Office Visit from 07/18/2023 in [facility] Video Visit from 02/23/2023 in [facility] Office Visit from 07/08/2022 in [facility] Office Visit from 01[facility]06/2022 in [facility] Video Visit from 11/24/2021 in [facility] Total Score 2 0 3 2 0  PHQ-9 Total Score 10 4 13 12  0      Flowsheet Row Office Visit from 07/18/2023 in [facility] Video Visit from 01/17/2023 in [facility] Video Visit from 10/13/2022 in [facility]  C-SSRS RISK CATEGORY No  Risk No Risk No Risk        Assessment and Plan: Janira Mandell is a 47 year old [place] female, who is currently struggling with anxiety, depression, sleep problems, as well as fatigue as well as possible perimenopausal symptoms, will continue to benefit from medication readjustment to address mood as well as addition of a sleep medication for sleep issues, will also benefit from rule out sleep apnea, will benefit from the following plan.  Plan Other specified anxiety disorder-limited symptom attack-unstable Increase Lexapro to 15 mg p.o. daily Klonopin 0.5 mg as needed for severe anxiety attacks only Continue CBT with Ms.Megan Roach on a weekly basis  MDD-unstable Continue Wellbutrin XL 150 mg p.o. daily, patient advised to take it in the morning Lexapro increased to 15 mg p.o. daily  Insomnia-unstable Start trazodone 25-50 mg p.o. nightly Continue sleep hygiene techniques Patient will need management of night sweats likely perimenopausal  Hypersomnia-will refer to pulmonology again for sleep study.  Patient was unable to respond to that call when they contacted since she was traveling previously.  Patient provided education about drug to drug interaction between medications like Lexapro, trazodone.  Including serotonin syndrome.  Patient advised to increase the dosage of Lexapro first give it a few days and then add the trazodone at night.  Patient to monitor cautiously for side effects.   Follow-up in clinic in 5 weeks or sooner if needed.  Collaboration of Care: Collaboration of Care: Other patient referred to pulmonology as noted above.  Encouraged to keep appointment with therapist.  Patient/Guardian was advised Release of Information must be obtained prior to any record release in order to collaborate their care with an outside provider. Patient/Guardian was advised if they have not already done so to contact the  registration department to sign all necessary forms in order for [place] to release information regarding their care.   Consent: Patient/Guardian gives verbal consent for treatment and assignment of benefits for services provided during this visit. Patient/Guardian expressed understanding and agreed to proceed.   This note was generated in part or whole with voice recognition software. Voice recognition is usually quite accurate but there are transcription errors that can and very often do occur. I apologize for any typographical errors that were not detected and corrected.    Jomarie Longs, MD 07/18/2023, 2:36 PM

## 2023-07-18 NOTE — Patient Instructions (Signed)
Trazodone Tablets What is this medication? TRAZODONE (TRAZ oh done) treats depression. It increases the amount of serotonin in the brain, a hormone that helps regulate mood. This medicine may be used for other purposes; ask your health care provider or pharmacist if you have questions. COMMON BRAND NAME(S): Desyrel What should I tell my care team before I take this medication? They need to know if you have any of these conditions: Bipolar disorder Bleeding disorder Glaucoma Heart disease, or previous heart attack Irregular heartbeat or rhythm Kidney disease Liver disease Low levels of sodium in the blood Suicidal thoughts, plans, or attempt by you or a family member An unusual or allergic reaction to trazodone, other medications, foods, dyes, or preservatives Pregnant or trying to get pregnant Breastfeeding How should I use this medication? Take this medication by mouth with a glass of water. Take it as directed on the prescription label at the same time every day. Take this medication shortly after a meal or a light snack. Keep taking this medication unless your care team tells you to stop. Stopping it too quickly can cause serious side effects. It can also make your condition worse. A special MedGuide will be given to you by the pharmacist with each prescription and refill. Be sure to read this information carefully each time. Talk to your care team about the use of this medication in children. Special care may be needed. Overdosage: If you think you have taken too much of this medicine contact a poison control center or emergency room at once. NOTE: This medicine is only for you. Do not share this medicine with others. What if I miss a dose? If you miss a dose, take it as soon as you can. If it is almost time for your next dose, take only that dose. Do not take double or extra doses. What may interact with this medication? Do not take this medication with any of the following: Certain  medications for fungal infections, such as fluconazole, itraconazole, ketoconazole, posaconazole, voriconazole Cisapride Dronedarone Linezolid MAOIs, such as Carbex, Eldepryl, Marplan, Nardil, and Parnate Mesoridazine Methylene blue (injected into a vein) Pimozide Saquinavir Thioridazine This medication may also interact with the following: Alcohol Antiviral medications for HIV or AIDS Aspirin and aspirin-like medications Barbiturates, such as phenobarbital Certain medications for blood pressure, heart disease, irregular heart beat Certain medications for mental health conditions Certain medications for migraine headache, such as almotriptan, eletriptan, frovatriptan, naratriptan, rizatriptan, sumatriptan, zolmitriptan Certain medications for seizures, such as carbamazepine and phenytoin Certain medications for sleep Certain medications that treat or prevent blood clots, such as dalteparin, enoxaparin, warfarin Digoxin Fentanyl Lithium NSAIDS, medications for pain and inflammation, such as ibuprofen or naproxen Other medications that cause heart rhythm changes Rasagiline Supplements, such as St. John's wort, kava kava, valerian Tramadol Tryptophan This list may not describe all possible interactions. Give your health care provider a list of all the medicines, herbs, non-prescription drugs, or dietary supplements you use. Also tell them if you smoke, drink alcohol, or use illegal drugs. Some items may interact with your medicine. What should I watch for while using this medication? Visit your care team for regular checks on your progress. Tell your care team if your symptoms do not start to get better or if they get worse. Because it may take several weeks to see the full effects of this medication, it is important to continue your treatment as prescribed by your care team. Watch for new or worsening thoughts of suicide or depression. This   includes sudden changes in mood, behaviors,  or thoughts. These changes can happen at any time but are more common in the beginning of treatment or after a change in dose. Call your care team right away if you experience these thoughts or worsening depression. This medication may cause mood and behavior changes, such as anxiety, nervousness, irritability, hostility, restlessness, excitability, hyperactivity, or trouble sleeping. These changes can happen at any time but are more common in the beginning of treatment or after a change in dose. Call your care team right away if you notice any of these symptoms. This medication may affect your coordination, reaction time, or judgment. Do not drive or operate machinery until you know how this medication affects you. Sit up or stand slowly to reduce the risk of dizzy or fainting spells. Drinking alcohol with this medication can increase the risk of these side effects. This medication may cause dry eyes and blurred vision. If you wear contact lenses you may feel some discomfort. Lubricating drops may help. See your care team if the problem does not go away or is severe. Your mouth may get dry. Chewing sugarless gum or sucking hard candy and drinking plenty of water may help. Contact your care team if the problem does not go away or is severe. What side effects may I notice from receiving this medication? Side effects that you should report to your care team as soon as possible: Allergic reactions--skin rash, itching, hives, swelling of the face, lips, tongue, or throat Bleeding--bloody or [place]oux, tar-like stools, red or dark brown urine, vomiting blood or brown material that looks like coffee grounds, small, red or purple spots on skin, unusual bleeding or bruising Heart rhythm changes--fast or irregular heartbeat, dizziness, feeling faint or lightheaded, chest pain, trouble breathing Low blood pressure--dizziness, feeling faint or lightheaded, blurry vision Low sodium level--muscle weakness, fatigue,  dizziness, headache, confusion Prolonged or painful erection Serotonin syndrome--irritability, confusion, fast or irregular heartbeat, muscle stiffness, twitching muscles, sweating, high fever, seizures, chills, vomiting, diarrhea Sudden eye pain or change in vision such as blurry vision, seeing halos around lights, vision loss Thoughts of suicide or self-harm, worsening mood, feelings of depression Side effects that usually do not require medical attention (report to your care team if they continue or are bothersome): Change in sex drive or performance Constipation Dizziness Drowsiness Dry mouth This list may not describe all possible side effects. Call your doctor for medical advice about side effects. You may report side effects to FDA at 1-800-FDA-1088. Where should I keep my medication? Keep out of the reach of children and pets. Store at room temperature between 15 and 30 degrees C (59 to 86 degrees F). Protect from light. Keep container tightly closed. Throw away any unused medication after the expiration date. NOTE: This sheet is a summary. It may not cover all possible information. If you have questions about this medicine, talk to your doctor, pharmacist, or health care provider.  2024 Elsevier/Gold Standard (2022-09-29 00:00:00)  

## 2023-08-21 ENCOUNTER — Telehealth (INDEPENDENT_AMBULATORY_CARE_PROVIDER_SITE_OTHER): Payer: BC Managed Care – PPO | Admitting: Psychiatry

## 2023-08-21 ENCOUNTER — Encounter: Payer: Self-pay | Admitting: Psychiatry

## 2023-08-21 DIAGNOSIS — G4709 Other insomnia: Secondary | ICD-10-CM | POA: Diagnosis not present

## 2023-08-21 DIAGNOSIS — F418 Other specified anxiety disorders: Secondary | ICD-10-CM | POA: Diagnosis not present

## 2023-08-21 DIAGNOSIS — F3341 Major depressive disorder, recurrent, in partial remission: Secondary | ICD-10-CM | POA: Diagnosis not present

## 2023-08-21 DIAGNOSIS — F331 Major depressive disorder, recurrent, moderate: Secondary | ICD-10-CM

## 2023-08-21 NOTE — Progress Notes (Unsigned)
Virtual Visit via Video Note  I connected with Megan Roach on 08/21/23 at  4:30 PM EST by a video enabled telemedicine application and verified that I am speaking with the correct person using two identifiers.  Location Provider Location : [facility] Patient Location : Home  Participants: Patient , Provider    I discussed the limitations of evaluation and management by telemedicine and the availability of in person appointments. The patient expressed understanding and agreed to proceed.    I discussed the assessment and treatment plan with the patient. The patient was provided an opportunity to ask questions and all were answered. The patient agreed with the plan and demonstrated an understanding of the instructions.   The patient was advised to call back or seek an in-person evaluation if the symptoms worsen or if the condition fails to improve as anticipated.  BH MD OP Progress Note  08/22/2023 12:22 PM Loveta Dellis  MRN:  [id number]  Chief Complaint:  Chief Complaint  Patient presents with   Follow-up   Anxiety   Depression   HPI: Megan Roach is a 47 year old [place] female, married, stay-at-home mother, lives in [place], has a history of other specified anxiety disorder, MDD, insomnia, fatigue, was evaluated by telemedicine today.  Patient today reports she continues to struggle with her relationship with her mother.  She reports she is currently working with her therapist Ms. Sander Radon.  It has been hard to follow recommendations since it is complicated according to her.  Patient reports she however is trying her best to work on setting up boundaries with her mother.  She reports her relationship struggles with her mother affects her anxiety.  She does not believe increasing her dosage of Lexapro further is going to address that.  She would like to stay on the current dosage.  She is tolerating it well.  She is currently compliant on her medications.  Denies  side effects.  Since starting the trazodone sleep has improved.  She sleeps around 5 hours straight.  Does have hot flashes although sleep has improved since being on the trazodone.  Patient reports her family is supportive, her children and her spouse.  Patient denies any suicidality, homicidality or perceptual disturbances.  Patient denies any other concerns today.  Visit Diagnosis:    ICD-10-CM   1. Other specified anxiety disorders  F41.8    Limited symptom attack    2. Recurrent major depressive disorder, in partial remission (HCC)  F33.41     3. Other insomnia  G47.09    Rule out sleep apnea, perimenopausal symptoms      Past Psychiatric History: I have reviewed past psychiatric history from progress note on 07/08/2022.  Past Medical History:  Past Medical History:  Diagnosis Date   Asthma     Past Surgical History:  Procedure Laterality Date   CESAREAN SECTION     total of 3   RECTAL PROLAPSE REPAIR     TOTAL VAGINAL HYSTERECTOMY      Family Psychiatric History: I have reviewed family psychiatric history from progress note on 07/08/2022.  Family History:  Family History  Problem Relation Age of Onset   Depression Mother    Asthma Mother    Bladder Cancer Father    Idiopathic pulmonary fibrosis Father    Alcohol abuse Brother    Gallbladder disease Brother    Liver disease Brother    Anxiety disorder Son    ADD / ADHD Son    Cervical cancer Other  Colon cancer Neg Hx    Colon polyps Neg Hx    Esophageal cancer Neg Hx    Stomach cancer Neg Hx    Pancreatic cancer Neg Hx    Rectal cancer Neg Hx     Social History: Reviewed social history from progress note on 07/08/2022. Social History   Socioeconomic History   Marital status: Married    Spouse name: sugith   Number of children: 3   Years of education: Not on file   Highest education level: Professional school degree (e.g., MD, DDS, DVM, JD)  Occupational History   Not on file  Tobacco Use    Smoking status: Never   Smokeless tobacco: Never  Vaping Use   Vaping status: Never Used  Substance and Sexual Activity   Alcohol use: Yes    Alcohol/week: 1.0 standard drink of alcohol    Types: 1 Glasses of wine per week    Comment: socially   Drug use: Never   Sexual activity: Yes  Other Topics Concern   Not on file  Social History Narrative   Lives at home with family   R handed   Caffeine: 2 C of coffee a day   Social Determinants of Health   Financial Resource Strain: Not on file  Food Insecurity: No Food Insecurity (12/07/2020)   [facility]   Hunger Vital Sign    Worried About Running Out of Food in the Last Year: Never true    Ran Out of Food in the Last Year: Never true  Transportation Needs: Not on file  Physical Activity: Inactive (12/07/2020)   Received from [facility], [facility]   Exercise Vital Sign    Days of Exercise per Week: 0 days    Minutes of Exercise per Session: 0 min  Stress: No Stress Concern Present (12/07/2020)   Received from [facility] - Occupational Stress Questionnaire    Feeling of Stress : Not at all  Social Connections: Unknown (03/01/2022)   Received from [facility]   Social Network    Social Network: Not on file    Allergies:  Allergies  Allergen Reactions   Sulfa Antibiotics Swelling    Metabolic Disorder Labs: Lab Results  Component Value Date   HGBA1C 5.4 12/03/2021   No results found for: "PROLACTIN" Lab Results  Component Value Date   CHOL 195 01/30/2022   TRIG 54 01/30/2022   HDL 64 01/30/2022   CHOLHDL 3.0 01/30/2022   VLDL 11 01/30/2022   LDLCALC 120 (H) 01/30/2022   LDLCALC 119 (H) 12/03/2021   Lab Results  Component Value Date   TSH 1.98 12/03/2021    Therapeutic Level Labs: No results found for: "LITHIUM" No results found for: "VALPROATE" No results found for: "CBMZ"  Current  Medications: Current Outpatient Medications  Medication Sig Dispense Refill   albuterol (VENTOLIN HFA) 108 (90 Base) MCG/ACT inhaler Inhale 2 puffs into the lungs every 4 (four) hours as needed.     budesonide-formoterol (SYMBICORT) 160-4.5 MCG/ACT inhaler Inhale 2 puffs into the lungs daily.     buPROPion (WELLBUTRIN XL) 150 MG 24 hr tablet Take 1 tablet (150 mg total) by mouth in the morning. 90 tablet 0   escitalopram (LEXAPRO) 10 MG tablet Take 1.5 tablets (15 mg total) by mouth daily with breakfast. 135 tablet 0   estradiol (MINIVELLE) 0.05 MG/24HR patch Apply 1 patch twice a week by transdermal route.  Magnesium 500 MG CAPS Take 1 capsule by mouth daily.     Multiple Vitamin (MULTIVITAMIN WITH MINERALS) TABS tablet Take 1 tablet by mouth daily.     pantoprazole (PROTONIX) 40 MG tablet Take 40 mg by mouth at bedtime.     rizatriptan (MAXALT) 5 MG tablet Take 1 tablet (5 mg total) by mouth as needed for migraine. May repeat in 2 hours if needed 10 tablet 6   traZODone (DESYREL) 50 MG tablet Take 0.5 tablets (25 mg total) by mouth at bedtime as needed for sleep. 15 tablet 1   No current facility-administered medications for this visit.     Musculoskeletal: Strength & Muscle Tone:  UTA Gait & Station:  Seated Patient leans: N/A  Psychiatric Specialty Exam: Review of Systems  Psychiatric/Behavioral:  Positive for sleep disturbance. The patient is nervous/anxious.     There were no vitals taken for this visit.There is no height or weight on file to calculate BMI.  General Appearance: Fairly Groomed  Eye Contact:  Fair  Speech:  Normal Rate  Volume:  Normal  Mood:  Anxious  Affect:  Congruent  Thought Process:  Goal Directed and Descriptions of Associations: Intact  Orientation:  Full (Time, Place, and Person)  Thought Content: Logical   Suicidal Thoughts:  No  Homicidal Thoughts:  No  Memory:  Immediate;   Fair Recent;   Fair Remote;   Fair  Judgement:  Fair  Insight:   Fair  Psychomotor Activity:  Normal  Concentration:  Concentration: Fair and Attention Span: Fair  Recall:  [employer] of Knowledge: Fair  Language: Fair  Akathisia:  No  Handed:  Right  AIMS (if indicated): not done  Assets:  Communication Skills Desire for Improvement Housing Social Support  ADL's:  Intact  Cognition: WNL  Sleep:   improving   Screenings: GAD-7    Flowsheet Row Office Visit from 07/18/2023 in [facility] Office Visit from 07/08/2022 in [facility]  Total GAD-7 Score 14 9      PHQ2-9    Flowsheet Row Office Visit from 07/18/2023 in [facility] Video Visit from 02/23/2023 in [facility] Office Visit from 07/08/2022 in [facility] Office Visit from 01[facility]06/2022 in [facility] Video Visit from 11/24/2021 in [facility] Total Score 2 0 3 2 0  PHQ-9 Total Score 10 4 13 12  0      Flowsheet Row Video Visit from 08/21/2023 in [facility] Office Visit from 07/18/2023 in [facility] Video Visit from 01/17/2023 in [facility]  C-SSRS RISK CATEGORY No Risk No Risk No Risk        Assessment and Plan: Megan Roach is a 47 year old [place] female who is currently struggling with anxiety, depression, sleep problems, perimenopausal symptoms, continues to have relationship struggles with her mother which does have an impact on her mood , will benefit from continued CBT, medication management, plan as noted below.  Plan  Others specified anxiety disorder-limited symptom attack-unstable Continue Lexapro 15 mg p.o. daily Current anxiety due to her relationship  struggles, patient to continue CBT with Ms. Sander Radon and work on setting boundaries.  MDD-improving Wellbutrin XL 150 mg p.o. daily Lexapro 15 mg p.o. daily  Insomnia-improving Trazodone 25-50 mg p.o. nightly  Continue sleep hygiene techniques Patient also referred for sleep study-patient has upcoming appointment in January with pulmonology.    Collaboration of Care: Collaboration of Care: Referral or follow-up with counselor/therapist AEB patient to continue CBT  Patient/Guardian was advised Release of Information must be obtained prior to any record release in order to collaborate their care with an outside provider. Patient/Guardian was advised if they have not already done so to contact the registration department to sign all necessary forms in order for [place] to release information regarding their care.   Consent: Patient/Guardian gives verbal consent for treatment and assignment of benefits for services provided during this visit. Patient/Guardian expressed understanding and agreed to proceed.   Follow-up in clinic in 2 months or sooner if needed.  This note was generated in part or whole with voice recognition software. Voice recognition is usually quite accurate but there are transcription errors that can and very often do occur. I apologize for any typographical errors that were not detected and corrected.    Jomarie Longs, MD 08/22/2023, 12:22 PM

## 2023-08-29 ENCOUNTER — Institutional Professional Consult (permissible substitution): Payer: BC Managed Care – PPO | Admitting: Internal Medicine

## 2023-10-10 ENCOUNTER — Telehealth: Payer: Self-pay

## 2023-10-10 DIAGNOSIS — G4709 Other insomnia: Secondary | ICD-10-CM

## 2023-10-10 MED ORDER — TRAZODONE HCL 50 MG PO TABS
25.0000 mg | ORAL_TABLET | Freq: Every evening | ORAL | 0 refills | Status: DC | PRN
Start: 2023-10-10 — End: 2024-09-05

## 2023-10-10 NOTE — Telephone Encounter (Signed)
Received fax from pharmacy requesting a 90 day supply due to insurance for the following medication please advise  traZODone (DESYREL) 50 MG tablet   Preferred pharmacy    [facility] [phone] - [place], [place] - [address] [place] [address] [place] [address] [place] [address] Phone: [phone]  Fax: [phone]

## 2023-10-23 ENCOUNTER — Telehealth (INDEPENDENT_AMBULATORY_CARE_PROVIDER_SITE_OTHER): Payer: BC Managed Care – PPO | Admitting: Psychiatry

## 2023-10-23 DIAGNOSIS — F3341 Major depressive disorder, recurrent, in partial remission: Secondary | ICD-10-CM

## 2023-10-23 NOTE — Progress Notes (Signed)
 No response to call or text or video invite

## 2023-10-26 ENCOUNTER — Institutional Professional Consult (permissible substitution): Payer: BC Managed Care – PPO | Admitting: Internal Medicine

## 2023-12-01 ENCOUNTER — Telehealth: Payer: BC Managed Care – PPO | Admitting: Psychiatry

## 2023-12-01 ENCOUNTER — Telehealth (HOSPITAL_COMMUNITY): Payer: Self-pay | Admitting: Professional

## 2023-12-01 ENCOUNTER — Encounter: Payer: Self-pay | Admitting: Psychiatry

## 2023-12-01 DIAGNOSIS — R413 Other amnesia: Secondary | ICD-10-CM | POA: Diagnosis not present

## 2023-12-01 DIAGNOSIS — F0630 Mood disorder due to known physiological condition, unspecified: Secondary | ICD-10-CM

## 2023-12-01 DIAGNOSIS — G4709 Other insomnia: Secondary | ICD-10-CM | POA: Diagnosis not present

## 2023-12-01 DIAGNOSIS — F39 Unspecified mood [affective] disorder: Secondary | ICD-10-CM

## 2023-12-01 DIAGNOSIS — F410 Panic disorder [episodic paroxysmal anxiety] without agoraphobia: Secondary | ICD-10-CM | POA: Diagnosis not present

## 2023-12-01 MED ORDER — BUPROPION HCL 75 MG PO TABS: 75.0000 mg | ORAL_TABLET | Freq: Every morning | ORAL | 0 refills | Status: DC

## 2023-12-01 MED ORDER — CLONAZEPAM 0.5 MG PO TABS
0.5000 mg | ORAL_TABLET | Freq: Every day | ORAL | 0 refills | Status: DC | PRN
Start: 2023-12-01 — End: 2024-09-05

## 2023-12-01 NOTE — Progress Notes (Signed)
Virtual Visit via Video Note  I connected with Megan Roach on 12/01/23 at  8:30 AM EST by a video enabled telemedicine application and verified that I am speaking with the correct person using two identifiers.  Location Provider Location : [facility] Patient Location : Home  Participants: Patient , Spouse,Provider    I discussed the limitations of evaluation and management by telemedicine and the availability of in person appointments. The patient expressed understanding and agreed to proceed.   I discussed the assessment and treatment plan with the patient. The patient was provided an opportunity to ask questions and all were answered. The patient agreed with the plan and demonstrated an understanding of the instructions.   The patient was advised to call back or seek an in-person evaluation if the symptoms worsen or if the condition fails to improve as anticipated.  BH MD OP Progress Note  12/01/2023 2:12 PM Megan Roach  MRN:  [id number]  Chief Complaint:  Chief Complaint  Patient presents with   Follow-up   Depression   Medication Refill   Anxiety   HPI: Megan Roach is a 47 year old [place] female, married, stay-at-home mother, lives in [place], has a history of other specified anxiety disorder, depression, insomnia, fatigue was evaluated by telemedicine today.  The patient presents with mood instability and forgetfulness. She is accompanied by her husband, Megan Roach.  She experiences significant mood instability, characterized by alternating periods of high energy and low energy. During high-energy phases, she feels extremely energized, sleeps only two to three hours, and feels capable of 'conquering the world.' In contrast, low-energy days leave her feeling unlike herself, with diminished energy and motivation. She also suffers from extreme panic attacks, during which her heart races, and she feels numb and unable to focus.  She denies any suicidality,  homicidality or perceptual disturbances however reports there is a lot of negative self-talk.  Forgetfulness has become a concern, as noted by her husband and friends. She sometimes forgets conversations or actions, leading to confusion. An example includes an incident where she called her husband while driving, unsure of her location.  She has a history of depression and anxiety, currently managed with Lexapro 15 mg and Wellbutrin XL 150 mg. She also uses Trazodone as needed for sleep. Since the Lexapro dosage was increased in October, her manic episodes have become more frequent.  She is undergoing menopause and uses estrogen patches, which she is running low on. She has not consulted her OBGYN recently but plans to do so soon.  She was referred for a sleep study however she had to reschedule that appointment since she was called for jury duty.  She agrees to schedule an appointment soon.  Collateral information provided by husband who also believes patient is currently struggling with her mood symptoms and it has been challenging for her.  Husband wonders whether she can have a 'drug holiday', come off of all her medications that she is currently taking to understand if her current symptoms are medication induced or not.  Husband reports he is going to be home for the next few weeks except for 1 week when he has to travel out of the country however during that week his daughter will be home for spring break from college.  Patient also agrees with it.  She also wants to confirm there are no perimenopausal hormonal changes happening which could be contributing to her current mood symptoms.  She has a history of asthma and reports recent wheezing and  a severe cough.  Visit Diagnosis:    ICD-10-CM   1. Panic disorder  F41.0 buPROPion (WELLBUTRIN) 75 MG tablet    clonazePAM (KLONOPIN) 0.5 MG tablet    2. Mood disorder in conditions classified elsewhere  F06.30 buPROPion (WELLBUTRIN) 75 MG tablet     clonazePAM (KLONOPIN) 0.5 MG tablet   MDD with mixed features versus Bipolar disorder    3. Other insomnia  G47.09     4. Memory loss  R41.3 Ambulatory referral to Neurology      Past Psychiatric History: I have reviewed past psychiatric history from progress note on 07/08/2022.  Past Medical History:  Past Medical History:  Diagnosis Date   Asthma     Past Surgical History:  Procedure Laterality Date   CESAREAN SECTION     total of 3   RECTAL PROLAPSE REPAIR     TOTAL VAGINAL HYSTERECTOMY      Family Psychiatric History: I have reviewed family psychiatric history from progress note on 07/08/2022.  Family History:  Family History  Problem Relation Age of Onset   Depression Mother    Asthma Mother    Bladder Cancer Father    Idiopathic pulmonary fibrosis Father    Alcohol abuse Brother    Gallbladder disease Brother    Liver disease Brother    Anxiety disorder Son    ADD / ADHD Son    Cervical cancer Other    Colon cancer Neg Hx    Colon polyps Neg Hx    Esophageal cancer Neg Hx    Stomach cancer Neg Hx    Pancreatic cancer Neg Hx    Rectal cancer Neg Hx     Social History: I have reviewed social history from progress note on 07/08/2022. Social History   Socioeconomic History   Marital status: Married    Spouse name: sugith   Number of children: 3   Years of education: Not on file   Highest education level: Professional school degree (e.g., MD, DDS, DVM, JD)  Occupational History   Not on file  Tobacco Use   Smoking status: Never   Smokeless tobacco: Never  Vaping Use   Vaping status: Never Used  Substance and Sexual Activity   Alcohol use: Yes    Alcohol/week: 1.0 standard drink of alcohol    Types: 1 Glasses of wine per week    Comment: socially   Drug use: Never   Sexual activity: Yes  Other Topics Concern   Not on file  Social History Narrative   Lives at home with family   R handed   Caffeine: 2 C of coffee a day   Social Drivers of Manufacturing engineer Strain: Not on file  Food Insecurity: No Food Insecurity (12/07/2020)   Received from [facility], [facility]   Hunger Vital Sign    Worried About Running Out of Food in the Last Year: Never true    Ran Out of Food in the Last Year: Never true  Transportation Needs: Not on file  Physical Activity: Inactive (12/07/2020)   [facility] from [facility]   Exercise Vital Sign    Days of Exercise per Week: 0 days    Minutes of Exercise per Session: 0 min  Stress: No Stress Concern Present (12/07/2020)   Received from [facility], [facility] - Occupational Stress Questionnaire    Feeling of Stress : Not at all  Social Connections: Unknown (03/01/2022)  Received from [facility], [facility]   Social Network    Social Network: Not on file    Allergies:  Allergies  Allergen Reactions   Sulfa Antibiotics Swelling    Metabolic Disorder Labs: Lab Results  Component Value Date   HGBA1C 5.4 12/03/2021   No results found for: "PROLACTIN" Lab Results  Component Value Date   CHOL 195 01/30/2022   TRIG 54 01/30/2022   HDL 64 01/30/2022   CHOLHDL 3.0 01/30/2022   VLDL 11 01/30/2022   LDLCALC 120 (H) 01/30/2022   LDLCALC 119 (H) 12/03/2021   Lab Results  Component Value Date   TSH 1.98 12/03/2021    Therapeutic Level Labs: No results found for: "LITHIUM" No results found for: "VALPROATE" No results found for: "CBMZ"  Current Medications: Current Outpatient Medications  Medication Sig Dispense Refill   buPROPion (WELLBUTRIN) 75 MG tablet Take 1 tablet (75 mg total) by mouth in the morning for 15 days. 15 tablet 0   clonazePAM (KLONOPIN) 0.5 MG tablet Take 1 tablet (0.5 mg total) by mouth daily as needed for anxiety. Should last 30 days , use only for emergencies 10 tablet 0   albuterol (VENTOLIN HFA) 108 (90 Base) MCG/ACT inhaler Inhale 2 puffs into the lungs every 4 (four) hours as  needed.     budesonide-formoterol (SYMBICORT) 160-4.5 MCG/ACT inhaler Inhale 2 puffs into the lungs daily.     escitalopram (LEXAPRO) 10 MG tablet Take 1.5 tablets (15 mg total) by mouth daily with breakfast. 135 tablet 0   estradiol (MINIVELLE) 0.05 MG/24HR patch Apply 1 patch twice a week by transdermal route.     Magnesium 500 MG CAPS Take 1 capsule by mouth daily.     Multiple Vitamin (MULTIVITAMIN WITH MINERALS) TABS tablet Take 1 tablet by mouth daily.     pantoprazole (PROTONIX) 40 MG tablet Take 40 mg by mouth at bedtime.     rizatriptan (MAXALT) 5 MG tablet Take 1 tablet (5 mg total) by mouth as needed for migraine. May repeat in 2 hours if needed 10 tablet 6   traZODone (DESYREL) 50 MG tablet Take 0.5 tablets (25 mg total) by mouth at bedtime as needed for sleep. 45 tablet 0   No current facility-administered medications for this visit.     Musculoskeletal: Strength & Muscle Tone:  UTA Gait & Station:  Seated Patient leans: N/A  Psychiatric Specialty Exam: Review of Systems  Psychiatric/Behavioral:  Positive for decreased concentration, dysphoric mood and sleep disturbance. The patient is nervous/anxious.     There were no vitals taken for this visit.There is no height or weight on file to calculate BMI.  General Appearance: Casual  Eye Contact:  Good  Speech:  Clear and Coherent  Volume:  Normal  Mood:   Mood swings, episodes of euphoria as well as episodes of depression and anxiety  Affect:  Congruent  Thought Process:  Goal Directed and Descriptions of Associations: Intact  Orientation:  Full (Time, Place, and Person)  Thought Content: Logical   Suicidal Thoughts:  No  Homicidal Thoughts:  No  Memory:  Immediate;   Fair Recent;   Fair Remote;   Fair  Judgement:  Fair  Insight:  Fair  Psychomotor Activity:  Normal  Concentration:  Concentration: Fair and Attention Span: Fair  Recall:  [employer] of Knowledge: Fair  Language: Fair  Akathisia:  No  Handed:   Right  AIMS (if indicated): not done  Assets:  Desire for Improvement Social Support Talents/Skills  Transportation  ADL's:  Intact  Cognition: WNL  Sleep:  Poor   Screenings: GAD-7    Flowsheet Row Office Visit from 07/18/2023 in [facility] Office Visit from 07/08/2022 in [facility]  Total GAD-7 Score 14 9      PHQ2-9    Flowsheet Row Office Visit from 07/18/2023 in [facility] Video Visit from 02/23/2023 in [facility] Office Visit from 07/08/2022 in [facility] Office Visit from 01/06/2022 in [facility] Video Visit from 11/24/2021 in [facility] Total Score 2 0 3 2 0  PHQ-9 Total Score 10 4 13 12  0      Flowsheet Row Video Visit from 12/01/2023 in [facility] Video Visit from 08/21/2023 in [facility] Office Visit from 07/18/2023 in [facility]  C-SSRS RISK CATEGORY No Risk No Risk No Risk        Assessment and Plan: Surena Welge is a 47 year old [place] female who is currently struggling with episodes of euphoria, hyperactivity, excessive spending sprees as well as episodes of depression and anxiety with lack of motivation and low energy and anhedonia as well as memory problems and sleep issues, returns for a follow-up appointment.  Discussed assessment and plan as noted below.  Mood disorder unspecified, rule out bipolar Disorder (suspected) -unstable Other specified anxiety disorder -unstable Insomnia-unstable Presents with mood instability, including periods of high energy and depressive episodes, forgetfulness, and panic attacks. Symptoms worsened since  Lexapro dosage increase in October. Differential diagnosis includes hormonal changes due to menopause and potential dissociation due to stress. History of major depression and anxiety, with consideration of bipolar spectrum disorder. Prefers to wean off current medications before starting a mood stabilizer. Discussed risks of polypharmacy and benefits of mood stabilizers for mixed episodes. Explained that mood stabilizers can be used adjunctively with antidepressants. Emphasized safety during medication weaning, especially with family support. - Wean off Wellbutrin to 75 mg for 15 days, then discontinue - Maintain Lexapro at current dose of 15 mg daily - Consider mood stabilizer in the future.  Patient however is currently not interested. - Continue Trazodone 25-50 mg at bedtime. - Refer to neurologist for evaluation of memory issues and confusion - Refer to [facility] PHP program for outpatient support - Start Clonazepam 0.5 mg daily as needed for emergency use during panic attacks - Reviewed  PMP AWARxE - Schedule follow-up appointment for March 27 at 1:00 PM - Ensure completion of sleep study - Coordinate care with current or new therapist - Possible referral to neuropsychologist in the future for diagnostic clarification as needed.   Menopausal Symptoms Experiencing hot flashes and other hormonal changes. Currently using estrogen patches. Discussed potential impact of hormonal changes on mood and importance of coordinating care with OBGYN. - Schedule appointment with OBGYN for hormonal management  General Health Maintenance Needs to complete a sleep study and follow up with a neurologist for memory issues. - Ensure completion of sleep study - Refer to neurologist for evaluation of memory issues and confusion  Follow-up - Schedule follow-up appointment for March 27 at 1:00 PM - Coordinate care with current or new therapist - Ensure completion of sleep study - Refer to neurologist  for evaluation of memory issues and confusion - Refer to [facility] PHP  program for intensive outpatient support.  Collateral information obtained from spouse as noted above. Collaboration of Care: Collaboration of Care: Other patient referred for [facility] program.  Patient also provided information for psychotherapist in the community.  Patient to continue with current therapist until she can establish with a new therapist.  Patient advised to follow up with neurology as well as sleep specialist.  Referral placed.  I have also communicated with staff to provide an ROI for patient to provide permission communicate with spouse.  Crisis plan discussed with patient as well as spouse, safety plan in place.  Patient/Guardian was advised Release of Information must be obtained prior to any record release in order to collaborate their care with an outside provider. Patient/Guardian was advised if they have not already done so to contact the registration department to sign all necessary forms in order for [place] to release information regarding their care.   Consent: Patient/Guardian gives verbal consent for treatment and assignment of benefits for services provided during this visit. Patient/Guardian expressed understanding and agreed to proceed.  I have spent atleast 40 minutes face to face with patient today which includes the time spent for preparing to see the patient ( e.g., review of test, records ), obtaining and to review and separately obtained history , ordering medications  ,psychoeducation and supportive psychotherapy and care coordination,as well as documenting clinical information in electronic health record.   Jomarie Longs, MD 12/01/2023, 2:12 PM

## 2023-12-01 NOTE — Patient Instructions (Addendum)
  www.[email or web address].org  www.psychologytoday  [email or web address] [facility] [address], [place], [place] [address]   [phone]  [facility] [email or web address] [address], [place], [place] [address]  [phone]  [facility], [facility] [email or web address] [address] [place], [place] [address]  [phone]   [facility] - [phone]  [facility] - [phone]  [facility] - [phone] counseling [phone]  [facility] [phone]   [email or web address] this clinician can offer telehealth and has a sliding scale option  [email or web address] this group also offers sliding scale rates and is based out of [place]  [employer] [facility] has interns who offer sliding scale rates and some of the full time clinicians do, as well. You complete their contact form on their website and the referrals coordinator will help to get connected to someone

## 2023-12-03 ENCOUNTER — Emergency Department (HOSPITAL_COMMUNITY): Payer: BC Managed Care – PPO

## 2023-12-03 ENCOUNTER — Other Ambulatory Visit: Payer: Self-pay

## 2023-12-03 ENCOUNTER — Inpatient Hospital Stay (HOSPITAL_COMMUNITY)
Admission: EM | Admit: 2023-12-03 | Discharge: 2023-12-11 | DRG: 189 | Disposition: A | Discharge status: Home / Self Care | Payer: BC Managed Care – PPO | Attending: Family Medicine | Admitting: Family Medicine

## 2023-12-03 ENCOUNTER — Encounter (HOSPITAL_COMMUNITY): Payer: Self-pay | Admitting: Internal Medicine

## 2023-12-03 DIAGNOSIS — J9601 Acute respiratory failure with hypoxia: Principal | ICD-10-CM

## 2023-12-03 DIAGNOSIS — G43909 Migraine, unspecified, not intractable, without status migrainosus: Secondary | ICD-10-CM | POA: Diagnosis present

## 2023-12-03 DIAGNOSIS — F419 Anxiety disorder, unspecified: Secondary | ICD-10-CM | POA: Diagnosis present

## 2023-12-03 DIAGNOSIS — K219 Gastro-esophageal reflux disease without esophagitis: Secondary | ICD-10-CM | POA: Diagnosis present

## 2023-12-03 DIAGNOSIS — M940 Chondrocostal junction syndrome [Tietze]: Secondary | ICD-10-CM | POA: Diagnosis present

## 2023-12-03 DIAGNOSIS — J45901 Unspecified asthma with (acute) exacerbation: Secondary | ICD-10-CM | POA: Diagnosis present

## 2023-12-03 DIAGNOSIS — Z8049 Family history of malignant neoplasm of other genital organs: Secondary | ICD-10-CM | POA: Diagnosis not present

## 2023-12-03 DIAGNOSIS — Z7951 Long term (current) use of inhaled steroids: Secondary | ICD-10-CM | POA: Diagnosis not present

## 2023-12-03 DIAGNOSIS — Z811 Family history of alcohol abuse and dependence: Secondary | ICD-10-CM

## 2023-12-03 DIAGNOSIS — J210 Acute bronchiolitis due to respiratory syncytial virus: Principal | ICD-10-CM

## 2023-12-03 DIAGNOSIS — Z882 Allergy status to sulfonamides status: Secondary | ICD-10-CM | POA: Diagnosis not present

## 2023-12-03 DIAGNOSIS — Z8489 Family history of other specified conditions: Secondary | ICD-10-CM

## 2023-12-03 DIAGNOSIS — Z825 Family history of asthma and other chronic lower respiratory diseases: Secondary | ICD-10-CM | POA: Diagnosis not present

## 2023-12-03 DIAGNOSIS — B338 Other specified viral diseases: Secondary | ICD-10-CM | POA: Diagnosis not present

## 2023-12-03 DIAGNOSIS — Z8052 Family history of malignant neoplasm of bladder: Secondary | ICD-10-CM | POA: Diagnosis not present

## 2023-12-03 DIAGNOSIS — F32A Depression, unspecified: Secondary | ICD-10-CM | POA: Diagnosis present

## 2023-12-03 DIAGNOSIS — J4541 Moderate persistent asthma with (acute) exacerbation: Secondary | ICD-10-CM | POA: Diagnosis not present

## 2023-12-03 DIAGNOSIS — Z1152 Encounter for screening for COVID-19: Secondary | ICD-10-CM

## 2023-12-03 DIAGNOSIS — Z818 Family history of other mental and behavioral disorders: Secondary | ICD-10-CM | POA: Diagnosis not present

## 2023-12-03 DIAGNOSIS — Z888 Allergy status to other drugs, medicaments and biological substances status: Secondary | ICD-10-CM | POA: Diagnosis not present

## 2023-12-03 DIAGNOSIS — M62838 Other muscle spasm: Secondary | ICD-10-CM | POA: Diagnosis present

## 2023-12-03 DIAGNOSIS — Z79899 Other long term (current) drug therapy: Secondary | ICD-10-CM | POA: Diagnosis not present

## 2023-12-03 DIAGNOSIS — R062 Wheezing: Secondary | ICD-10-CM | POA: Diagnosis not present

## 2023-12-03 DIAGNOSIS — R0609 Other forms of dyspnea: Secondary | ICD-10-CM

## 2023-12-03 LAB — BASIC METABOLIC PANEL
Anion gap: 11 (ref 5–15)
BUN: 14 mg/dL (ref 6–20)
CO2: 18 mmol/L — ABNORMAL LOW (ref 22–32)
Calcium: 8.5 mg/dL — ABNORMAL LOW (ref 8.9–10.3)
Chloride: 108 mmol/L (ref 98–111)
Creatinine, Ser: 0.91 mg/dL (ref 0.44–1.00)
GFR, Estimated: >60 mL/min (ref >60)
Glucose, Bld: 113 mg/dL — ABNORMAL HIGH (ref 70–99)
Potassium: 3.3 mmol/L — ABNORMAL LOW (ref 3.5–5.1)
Sodium: 137 mmol/L (ref 135–145)

## 2023-12-03 LAB — RESP PANEL BY RT-PCR (RSV, FLU A&B, COVID)  RVPGX2
Influenza A by PCR: NEGATIVE
Influenza B by PCR: NEGATIVE
Resp Syncytial Virus by PCR: POSITIVE — AB
SARS Coronavirus 2 by RT PCR: NEGATIVE

## 2023-12-03 LAB — CBC
HCT: 39.1 % (ref 36.0–46.0)
Hemoglobin: 13.3 g/dL (ref 12.0–15.0)
MCH: 29 pg (ref 26.0–34.0)
MCHC: 34 g/dL (ref 30.0–36.0)
MCV: 85.4 fL (ref 80.0–100.0)
Platelets: 299 10*3/uL (ref 150–400)
RBC: 4.58 MIL/uL (ref 3.87–5.11)
RDW: 13.7 % (ref 11.5–15.5)
WBC: 8.5 10*3/uL (ref 4.0–10.5)
nRBC: 0 % (ref 0.0–0.2)

## 2023-12-03 LAB — BLOOD GAS, VENOUS
Acid-base deficit: 2.7 mmol/L — ABNORMAL HIGH (ref 0.0–2.0)
Bicarbonate: 20.6 mmol/L (ref 20.0–28.0)
O2 Saturation: 85.8 %
Patient temperature: 37
pCO2, Ven: 31 mm[Hg] — ABNORMAL LOW (ref 44–60)
pH, Ven: 7.43 (ref 7.25–7.43)
pO2, Ven: 49 mm[Hg] — ABNORMAL HIGH (ref 32–45)

## 2023-12-03 LAB — MAGNESIUM: Magnesium: 3.2 mg/dL — ABNORMAL HIGH (ref 1.7–2.4)

## 2023-12-03 LAB — HIV ANTIBODY (ROUTINE TESTING W REFLEX): HIV Screen 4th Generation wRfx: NONREACTIVE

## 2023-12-03 MED ORDER — ESCITALOPRAM OXALATE 10 MG PO TABS
15.0000 mg | ORAL_TABLET | Freq: Every day | ORAL | Status: DC
  Administered 2023-12-04 – 2023-12-11 (×8): 15 mg via ORAL
  Filled 2023-12-03 (×8): qty 2

## 2023-12-03 MED ORDER — ALBUTEROL SULFATE (2.5 MG/3ML) 0.083% IN NEBU
2.5000 mg | INHALATION_SOLUTION | Freq: Once | RESPIRATORY_TRACT | Status: AC
  Administered 2023-12-03: 2.5 mg via RESPIRATORY_TRACT
  Filled 2023-12-03: qty 3

## 2023-12-03 MED ORDER — CLONAZEPAM 0.5 MG PO TABS: 0.5000 mg | ORAL_TABLET | Freq: Three times a day (TID) | ORAL | Status: DC | PRN

## 2023-12-03 MED ORDER — MOMETASONE FURO-FORMOTEROL FUM 200-5 MCG/ACT IN AERO
2.0000 | INHALATION_SPRAY | Freq: Two times a day (BID) | RESPIRATORY_TRACT | Status: DC
  Administered 2023-12-04: 2 via RESPIRATORY_TRACT
  Filled 2023-12-03: qty 8.8

## 2023-12-03 MED ORDER — PANTOPRAZOLE SODIUM 40 MG PO TBEC
40.0000 mg | DELAYED_RELEASE_TABLET | Freq: Every day | ORAL | Status: DC
  Administered 2023-12-03 – 2023-12-10 (×8): 40 mg via ORAL
  Filled 2023-12-03 (×8): qty 1

## 2023-12-03 MED ORDER — ACETAMINOPHEN 325 MG PO TABS: 650.0000 mg | ORAL_TABLET | Freq: Four times a day (QID) | ORAL | Status: DC | PRN

## 2023-12-03 MED ORDER — METHYLPREDNISOLONE SODIUM SUCC 125 MG IJ SOLR
125.0000 mg | Freq: Once | INTRAMUSCULAR | Status: AC
  Administered 2023-12-03: 125 mg via INTRAVENOUS
  Filled 2023-12-03: qty 2

## 2023-12-03 MED ORDER — ACETAMINOPHEN 650 MG RE SUPP: 650.0000 mg | Freq: Four times a day (QID) | RECTAL | Status: DC | PRN

## 2023-12-03 MED ORDER — ONDANSETRON HCL 4 MG PO TABS: 4.0000 mg | ORAL_TABLET | Freq: Four times a day (QID) | ORAL | Status: DC | PRN

## 2023-12-03 MED ORDER — ONDANSETRON HCL 4 MG/2ML IJ SOLN: 4.0000 mg | Freq: Four times a day (QID) | INTRAMUSCULAR | Status: DC | PRN

## 2023-12-03 MED ORDER — ENOXAPARIN SODIUM 40 MG/0.4ML IJ SOSY
40.0000 mg | PREFILLED_SYRINGE | INTRAMUSCULAR | Status: DC
  Administered 2023-12-03 – 2023-12-09 (×7): 40 mg via SUBCUTANEOUS
  Filled 2023-12-03 (×8): qty 0.4

## 2023-12-03 MED ORDER — BUPROPION HCL 75 MG PO TABS
75.0000 mg | ORAL_TABLET | Freq: Every day | ORAL | Status: DC
  Administered 2023-12-04 – 2023-12-11 (×8): 75 mg via ORAL
  Filled 2023-12-03 (×8): qty 1

## 2023-12-03 MED ORDER — POTASSIUM CHLORIDE CRYS ER 20 MEQ PO TBCR
40.0000 meq | EXTENDED_RELEASE_TABLET | Freq: Once | ORAL | Status: AC
  Administered 2023-12-03: 40 meq via ORAL
  Filled 2023-12-03: qty 2

## 2023-12-03 MED ORDER — ALBUTEROL SULFATE (2.5 MG/3ML) 0.083% IN NEBU
2.5000 mg | INHALATION_SOLUTION | Freq: Once | RESPIRATORY_TRACT | Status: DC
Start: 2023-12-03 — End: 2023-12-03

## 2023-12-03 MED ORDER — LEVALBUTEROL HCL 0.63 MG/3ML IN NEBU
0.6300 mg | INHALATION_SOLUTION | Freq: Four times a day (QID) | RESPIRATORY_TRACT | Status: DC | PRN
  Administered 2023-12-03: 0.63 mg via RESPIRATORY_TRACT
  Filled 2023-12-03: qty 3

## 2023-12-03 MED ORDER — MAGNESIUM SULFATE 2 GM/50ML IV SOLN
2.0000 g | Freq: Once | INTRAVENOUS | Status: AC
  Administered 2023-12-03: 2 g via INTRAVENOUS
  Filled 2023-12-03: qty 50

## 2023-12-03 MED ORDER — ALBUTEROL SULFATE HFA 108 (90 BASE) MCG/ACT IN AERS
1.0000 | INHALATION_SPRAY | Freq: Once | RESPIRATORY_TRACT | Status: AC
  Administered 2023-12-03: 2 via RESPIRATORY_TRACT
  Filled 2023-12-03: qty 6.7

## 2023-12-03 MED ORDER — TRAZODONE HCL 50 MG PO TABS
25.0000 mg | ORAL_TABLET | Freq: Every evening | ORAL | Status: DC | PRN
  Filled 2023-12-03: qty 1

## 2023-12-03 MED ORDER — SUMATRIPTAN SUCCINATE 50 MG PO TABS: 50.0000 mg | ORAL_TABLET | ORAL | Status: DC | PRN

## 2023-12-03 NOTE — Progress Notes (Signed)
 Patient transferred from ED. Placed on BIPAP per order. Patient has increased WOB.

## 2023-12-03 NOTE — ED Provider Notes (Signed)
 [place] [facility] AT [facility] Provider Note   CSN: [id number] Arrival date & time: 12/03/23  1416     History  Chief Complaint  Patient presents with   Shortness of Breath    Megan Roach is a 48 y.o. female.   Shortness of Breath   48 year old female presents emergency department with complaints of shortness of breath, wheezing.  Patient reports having cough, congestion for the past few days.  Reports known sick contact through her son who is ill over the past week or so.  Patient reports history of asthma and states that whenever she gets a cold, worsens her breathing.  Has been trying her at home albuterol with some improvement of symptoms of symptoms seem to be temporary.  Reports worsening of breathing last night.  States he has a pulse ox at home and her O2 sats dropped into the 80s.  Not on oxygen at baseline.  Reports chest tightness.  Denies any fever, chills, abdominal pain, nausea, vomiting or urinary symptoms potential bowel habits.  Past medical history significant for asthma, leiomyoma, strokelike symptoms  Home Medications Prior to Admission medications   Medication Sig Start Date End Date Taking? Authorizing Provider  albuterol (VENTOLIN HFA) 108 (90 Base) MCG/ACT inhaler Inhale 2 puffs into the lungs every 4 (four) hours as needed. 11/02/22   [provider]  budesonide-formoterol (SYMBICORT) 160-4.5 MCG/ACT inhaler Inhale 2 puffs into the lungs daily. 11/30/22   [provider]  buPROPion (WELLBUTRIN) 75 MG tablet Take 1 tablet (75 mg total) by mouth in the morning for 15 days. 12/01/23 12/16/23  Jomarie Longs, MD  clonazePAM (KLONOPIN) 0.5 MG tablet Take 1 tablet (0.5 mg total) by mouth daily as needed for anxiety. Should last 30 days , use only for emergencies 12/01/23 12/31/23  Jomarie Longs, MD  escitalopram (LEXAPRO) 10 MG tablet Take 1.5 tablets (15 mg total) by mouth daily with breakfast. 07/18/23   Jomarie Longs, MD  estradiol (MINIVELLE) 0.05 MG/24HR patch Apply 1 patch twice a week by transdermal route. 05/18/22   [provider]  Magnesium 500 MG CAPS Take 1 capsule by mouth daily.    [provider]  Multiple Vitamin (MULTIVITAMIN WITH MINERALS) TABS tablet Take 1 tablet by mouth daily.    [provider]  pantoprazole (PROTONIX) 40 MG tablet Take 40 mg by mouth at bedtime. 11/14/22   [provider]  rizatriptan (MAXALT) 5 MG tablet Take 1 tablet (5 mg total) by mouth as needed for migraine. May repeat in 2 hours if needed 03/11/22   Ocie Doyne, MD  traZODone (DESYREL) 50 MG tablet Take 0.5 tablets (25 mg total) by mouth at bedtime as needed for sleep. 10/10/23   Thresa Ross, MD      Allergies    Sulfa antibiotics    Review of Systems   Review of Systems  Respiratory:  Positive for shortness of breath.   All other systems reviewed and are negative.   Physical Exam Updated Vital Signs BP 130/71   Pulse (!) 102   Temp 98.4 F (36.9 C) (Oral)   Resp (!) 24   Ht 4\' 11"  (1.499 m)   Wt 67.1 kg   SpO2 100%   BMI 29.89 kg/m  Physical Exam Vitals and nursing note reviewed.  Constitutional:      General: She is not in acute distress.    Appearance: She is well-developed. She is ill-appearing.  HENT:     Head: Normocephalic and  atraumatic.  Eyes:     Conjunctiva/sclera: Conjunctivae normal.  Cardiovascular:     Rate and Rhythm: Normal rate and regular rhythm.     Heart sounds: No murmur heard. Pulmonary:     Effort: Tachypnea and respiratory distress present.     Breath sounds: Wheezing present.  Abdominal:     Palpations: Abdomen is soft.     Tenderness: There is no abdominal tenderness.  Musculoskeletal:        General: No swelling.     Cervical back: Neck supple.  Skin:    General: Skin is warm and dry.     Capillary Refill: Capillary refill takes less than 2 seconds.  Neurological:     Mental Status: She is alert.   Psychiatric:        Mood and Affect: Mood normal.     ED Results / Procedures / Treatments   Labs (all labs ordered are listed, but only abnormal results are displayed) Labs Reviewed  RESP PANEL BY RT-PCR (RSV, FLU A&B, COVID)  RVPGX2 - Abnormal; Notable for the following components:      Result Value   Resp Syncytial Virus by PCR POSITIVE (*)    All other components within normal limits  BASIC METABOLIC PANEL - Abnormal; Notable for the following components:   Potassium 3.3 (*)    CO2 18 (*)    Glucose, Bld 113 (*)    Calcium 8.5 (*)    All other components within normal limits  BLOOD GAS, VENOUS - Abnormal; Notable for the following components:   pCO2, Ven 31 (*)    pO2, Ven 49 (*)    Acid-base deficit 2.7 (*)    All other components within normal limits  MAGNESIUM - Abnormal; Notable for the following components:   Magnesium 3.2 (*)    All other components within normal limits  CBC  HIV ANTIBODY (ROUTINE TESTING W REFLEX)  BASIC METABOLIC PANEL  CBC    EKG EKG Interpretation Date/Time:  Sunday December 03 2023 14:52:44 EST Ventricular Rate:  101 PR Interval:  158 QRS Duration:  94 QT Interval:  401 QTC Calculation: 520 R Axis:   38  Text Interpretation: Sinus tachycardia Borderline T abnormalities, anterior leads Prolonged QT interval Confirmed by Vanetta Mulders [phone]) on 12/03/2023 3:24:10 PM  Radiology DG Chest Port 1 View Result Date: 12/03/2023 CLINICAL DATA:  Shortness of breath EXAM: PORTABLE CHEST 1 VIEW COMPARISON:  11/07/2022 FINDINGS: The heart size and mediastinal contours are within normal limits. Low lung volumes with crowding of the central bronchovascular markings. No focal airspace consolidation, pleural effusion, or pneumothorax. The visualized skeletal structures are unremarkable. IMPRESSION: Low lung volumes. No acute cardiopulmonary findings. Electronically Signed   By: Duanne Guess D.O.   On: 12/03/2023 15:57    Procedures .Critical  Care  Performed by: Peter Garter, PA Authorized by: Peter Garter, PA   Critical care provider statement:    Critical care time (minutes):  41   Critical care was necessary to treat or prevent imminent or life-threatening deterioration of the following conditions:  Respiratory failure   Critical care was time spent personally by me on the following activities:  Development of treatment plan with patient or surrogate, discussions with consultants, evaluation of patient's response to treatment, examination of patient, ordering and review of laboratory studies, ordering and review of radiographic studies, ordering and performing treatments and interventions, pulse oximetry, re-evaluation of patient's condition and review of old charts   I assumed direction of  critical care for this patient from another provider in my specialty: no     Care discussed with: admitting provider       Medications Ordered in ED Medications  SUMAtriptan (IMITREX) tablet 50 mg (has no administration in time range)  buPROPion [facility]) tablet 75 mg (has no administration in time range)  escitalopram (LEXAPRO) tablet 15 mg (has no administration in time range)  traZODone (DESYREL) tablet 25 mg (has no administration in time range)  pantoprazole (PROTONIX) EC tablet 40 mg (has no administration in time range)  clonazePAM (KLONOPIN) tablet 0.5 mg (has no administration in time range)  mometasone-formoterol (DULERA) 200-5 MCG/ACT inhaler 2 puff (has no administration in time range)  enoxaparin (LOVENOX) injection 40 mg (has no administration in time range)  acetaminophen (TYLENOL) tablet 650 mg (has no administration in time range)    Or  acetaminophen (TYLENOL) suppository 650 mg (has no administration in time range)  ondansetron (ZOFRAN) tablet 4 mg (has no administration in time range)    Or  ondansetron (ZOFRAN) injection 4 mg (has no administration in time range)  levalbuterol (XOPENEX) nebulizer  solution 0.63 mg (has no administration in time range)  magnesium sulfate IVPB 2 g 50 mL (0 g Intravenous Stopped 12/03/23 1556)  albuterol (PROVENTIL) (2.5 MG/3ML) 0.083% nebulizer solution 2.5 mg (2.5 mg Nebulization Given 12/03/23 1459)  methylPREDNISolone sodium succinate (SOLU-MEDROL) 125 mg/2 mL injection 125 mg (125 mg Intravenous Given 12/03/23 1453)  albuterol (VENTOLIN HFA) 108 (90 Base) MCG/ACT inhaler 1-2 puff (2 puffs Inhalation Given 12/03/23 1606)  potassium chloride SA (KLOR-CON M) CR tablet 40 mEq (40 mEq Oral Given 12/03/23 1708)    ED Course/ Medical Decision Making/ A&P Clinical Course as of 12/03/23 1815  Sun Dec 03, 2023  1640 Reassess the patient after neb x 3 showed still with increased work of breathing.  Patient's respiratory rates in the 30s with diffuse wheeze that seems to have minimally improved with Solu-Medrol as well as nebulized treatment.  Will place patient on BiPAP and pursue admission. [CR]  1704 Consulted Dr. Erenest Blank of hospital medicine who agreed with admission and assume further treatment/care. [CR]    Clinical Course User Index [CR] Peter Garter, PA                                 Medical Decision Making Amount and/or Complexity of Data Reviewed Labs: ordered. Radiology: ordered.  Risk Prescription drug management. Decision regarding hospitalization.   This patient presents to the ED for concern of shortness of breath, this involves an extensive number of treatment options, and is a complaint that carries with it a high risk of complications and morbidity.  The differential diagnosis includes asthma, COPD, COVID, flu, RSV, pneumonia, pneumothorax, ACS, other   Co morbidities that complicate the patient evaluation  See HPI   Additional history obtained:  Additional history obtained from EMR External records from outside source obtained and reviewed including hospital records   Lab Tests:  I Ordered, and personally interpreted labs.   The pertinent results include: No leukocytosis.  No evidence of anemia.  Platelets within range.   Imaging Studies ordered:  I ordered imaging studies including x-ray I independently visualized and interpreted imaging which showed no acute cardiopulmonary abnormalities I agree with the radiologist interpretation   Cardiac Monitoring: / EKG:  The patient was maintained on a cardiac monitor.  I personally viewed and interpreted the cardiac monitored which  showed an underlying rhythm of: Sinus tachycardia.  Prolonged QT.   Consultations Obtained:  See ED course  Problem List / ED Course / Critical interventions / Medication management  RSV, shortness of breath, asthma exacerbation I ordered medication including Toradol, Solu-Medrol, magnesium sulfate, potassium chloride   Reevaluation of the patient after these medicines showed that the patient improved I have reviewed the patients home medicines and have made adjustments as needed   Social Determinants of Health:  Denies tobacco, licit drug use.   Test / Admission - Considered:  RSV, shortness of breath, asthma exacerbation, respiratory distress Vitals signs significant for tachycardia, tachypnea. Otherwise within normal range and stable throughout visit. Laboratory/imaging studies significant for: See above 48 year old female presents emergency department with shortness of breath.  Patient has been ill with respiratory symptoms for the past few days after known exposure to viral illness by her son a week prior.  On exam, patient with increased work of breathing, tachypnea in the 30s with tachycardia in the 1 teens.  Diffuse wheezing appreciated on lung exam bilaterally.  Treated with multiple rounds of nebulized therapy, Solu-Medrol, magnesium sulfate with slight improvement.  After 2 and half plus hours, still with increased work of breathing with accessory muscle usage.  Chest x-ray without obvious pneumonia, pneumothorax or  other acute cardiopulmonary abnormality.  Viral testing positive for influenza.  Otherwise, labs not concerning for acute emergent process.  Given patient's increased work of breathing, begun on BiPAP.  Will pursue admission after shared decision made conversation was had with patient given continued work of breathing.  Suspect asthma exacerbation in the setting of RSV.  Treatment plan discussed at length with patient and she acknowledged understanding was agreeable to said plan.  Patient stable upon admission.        Final Clinical Impression(s) / ED Diagnoses Final diagnoses:  RSV (acute bronchiolitis due to respiratory syncytial virus)    Rx / DC Orders ED Discharge Orders     None         Peter Garter, [place] 12/03/23 1815    Vanetta Mulders, MD 12/06/23 1103

## 2023-12-03 NOTE — Progress Notes (Signed)
 PT was starting neb tx and began to shake and HR spiked to 140s.  PT was not able to finish. EMT at bedside noted it as well. Will let RN know.  May attempt Albuterol MDI since the pt uses this at home.

## 2023-12-03 NOTE — H&P (Signed)
 History and Physical  Megan Roach ZOX:[id number] DOB: 11/15/1975 DOA: 12/03/2023  PCP: Mosetta Putt, MD   Chief Complaint: Wheezing, shortness of breath  HPI: Megan Roach is a 48 y.o. female with medical history significant for anxiety and reactive airway disease being admitted to the hospital with acute hypoxic respiratory failure and exacerbation of her reactive airway disease in the setting of RSV infection.  Her son and husband were sick over the last few days, though they tested negative for flu, RSV and COVID.  Starting about 3 days ago, she started developing congestion, cough, wheezing and shortness of breath.  While she does not have a formal diagnosis of asthma, she states that both her father and mother had presumptive COPD, and that her whole life she has had trouble with persistent cough and wheezing every time the weather became cold, and anytime she gets sick her cough will persist for several months.  She has seen pulmonology in the past, was maintained for several years on Singulair and Advair though has not been on any regular respiratory medications the last few years since she has been doing well.  In any case, over the last 24 hours her cough and wheezing has worsened, really got more short of breath overnight last night.  Also had some chest tightness, experiencing cough, nausea, dizziness.  She was brought in by EMS who placed her on supplemental oxygen.  They gave her some fluids, 125 mg Solu-Medrol.  Started her on DuoNeb, but she developed significant tachycardia, states that since she became an adult has not been able to tolerate albuterol nebulizer, though she does take albuterol inhaler at home.  Review of Systems: Please see HPI for pertinent positives and negatives. A complete 10 system review of systems are otherwise negative.  Past Medical History:  Diagnosis Date   Asthma    Past Surgical History:  Procedure Laterality Date   CESAREAN SECTION      total of 3   RECTAL PROLAPSE REPAIR     TOTAL VAGINAL HYSTERECTOMY     Social History:  reports that she has never smoked. She has never used smokeless tobacco. She reports current alcohol use of about 1.0 standard drink of alcohol per week. She reports that she does not use drugs.  Allergies  Allergen Reactions   Sulfa Antibiotics Swelling    Family History  Problem Relation Age of Onset   Depression Mother    Asthma Mother    Bladder Cancer Father    Idiopathic pulmonary fibrosis Father    Alcohol abuse Brother    Gallbladder disease Brother    Liver disease Brother    Anxiety disorder Son    ADD / ADHD Son    Cervical cancer Other    Colon cancer Neg Hx    Colon polyps Neg Hx    Esophageal cancer Neg Hx    Stomach cancer Neg Hx    Pancreatic cancer Neg Hx    Rectal cancer Neg Hx      Prior to Admission medications   Medication Sig Start Date End Date Taking? Authorizing Provider  albuterol (VENTOLIN HFA) 108 (90 Base) MCG/ACT inhaler Inhale 2 puffs into the lungs every 4 (four) hours as needed. 11/02/22   [provider]  budesonide-formoterol (SYMBICORT) 160-4.5 MCG/ACT inhaler Inhale 2 puffs into the lungs daily. 11/30/22   [provider]  buPROPion (WELLBUTRIN) 75 MG tablet Take 1 tablet (75 mg total) by mouth in the morning for 15 days. 12/01/23 12/16/23  Jomarie Longs, MD  clonazePAM (KLONOPIN) 0.5 MG tablet Take 1 tablet (0.5 mg total) by mouth daily as needed for anxiety. Should last 30 days , use only for emergencies 12/01/23 12/31/23  Jomarie Longs, MD  escitalopram (LEXAPRO) 10 MG tablet Take 1.5 tablets (15 mg total) by mouth daily with breakfast. 07/18/23   Jomarie Longs, MD  estradiol (MINIVELLE) 0.05 MG/24HR patch Apply 1 patch twice a week by transdermal route. 05/18/22   [provider]  Magnesium 500 MG CAPS Take 1 capsule by mouth daily.    [provider]  Multiple Vitamin (MULTIVITAMIN WITH MINERALS) TABS tablet Take 1  tablet by mouth daily.    [provider]  pantoprazole (PROTONIX) 40 MG tablet Take 40 mg by mouth at bedtime. 11/14/22   [provider]  rizatriptan (MAXALT) 5 MG tablet Take 1 tablet (5 mg total) by mouth as needed for migraine. May repeat in 2 hours if needed 03/11/22   Ocie Doyne, MD  traZODone (DESYREL) 50 MG tablet Take 0.5 tablets (25 mg total) by mouth at bedtime as needed for sleep. 10/10/23   Thresa Ross, MD    Physical Exam: BP (!) 144/89   Pulse 92   Temp 98.4 F (36.9 C) (Oral)   Resp 18   Ht 4\' 11"  (1.499 m)   Wt 67.1 kg   SpO2 97%   BMI 29.89 kg/m  General:  Alert, oriented, calm, in no acute distress, her husband at the bedside.  On my arrival she is actually on room air, minimally tachypneic.  However as we converse more, she develops a little bit more wheezing, and is visibly more tachypneic. Cardiovascular: RRR, no murmurs or rubs, no peripheral edema  Respiratory: Only minimally reduced equal bilateral air entry, diffuse rhonchi and wheezing.  No crackles, no retractions, stridor.  Mild tachypnea and mild respiratory distress with speech. Abdomen: soft, nontender, nondistended, normal bowel tones heard  Skin: dry, no rashes  Musculoskeletal: no joint effusions, normal range of motion  Psychiatric: appropriate affect, normal speech  Neurologic: extraocular muscles intact, clear speech, moving all extremities with intact sensorium         Labs on Admission:  Basic Metabolic Panel: Recent Labs  Lab 12/03/23 1449  NA 137  K 3.3*  CL 108  CO2 18*  GLUCOSE 113*  BUN 14  CREATININE 0.91  CALCIUM 8.5*   Liver Function Tests: No results for input(s): "AST", "ALT", "ALKPHOS", "BILITOT", "PROT", "ALBUMIN" in the last 168 hours. No results for input(s): "LIPASE", "AMYLASE" in the last 168 hours. No results for input(s): "AMMONIA" in the last 168 hours. CBC: Recent Labs  Lab 12/03/23 1449  WBC 8.5  HGB 13.3  HCT 39.1  MCV 85.4  PLT  299   Cardiac Enzymes: No results for input(s): "CKTOTAL", "CKMB", "CKMBINDEX", "TROPONINI" in the last 168 hours. BNP (last 3 results) No results for input(s): "BNP" in the last 8760 hours.  ProBNP (last 3 results) No results for input(s): "PROBNP" in the last 8760 hours.  CBG: No results for input(s): "GLUCAP" in the last 168 hours.  Radiological Exams on Admission: DG Chest Port 1 View Result Date: 12/03/2023 CLINICAL DATA:  Shortness of breath EXAM: PORTABLE CHEST 1 VIEW COMPARISON:  11/07/2022 FINDINGS: The heart size and mediastinal contours are within normal limits. Low lung volumes with crowding of the central bronchovascular markings. No focal airspace consolidation, pleural effusion, or pneumothorax. The visualized skeletal structures are unremarkable. IMPRESSION: Low lung volumes. No acute cardiopulmonary findings.  Electronically Signed   By: Duanne Guess D.O.   On: 12/03/2023 15:57   Assessment/Plan Mintie Gains is a 48 y.o. female with medical history significant for anxiety and reactive airway disease being admitted to the hospital with acute hypoxic respiratory failure and exacerbation of her reactive airway disease in the setting of RSV infection.   Acute hypoxic respiratory failure-in the setting of RSV infection, causing exacerbation of her baseline reactive airway disease likely asthma versus COPD. -Inpatient admission to progressive -Continue supplemental oxygen -Agree with BiPAP for work of breathing -Continue IV Solu-Medrol -Continue inhaled Dulera, with as needed Xopenex nebulizer  GERD-Protonix  Depression and anxiety-continue Wellbutrin, Lexapro, Klonopin as needed  History of migraine-Imitrex as needed  DVT prophylaxis: Lovenox     Code Status: Full Code  Consults called: None  Admission status: The appropriate patient status for this patient is INPATIENT. Inpatient status is judged to be reasonable and necessary in order to provide the  required intensity of service to ensure the patient's safety. The patient's presenting symptoms, physical exam findings, and initial radiographic and laboratory data in the context of their chronic comorbidities is felt to place them at high risk for further clinical deterioration. Furthermore, it is not anticipated that the patient will be medically stable for discharge from the hospital within 2 midnights of admission.    I certify that at the point of admission it is my clinical judgment that the patient will require inpatient hospital care spanning beyond 2 midnights from the point of admission due to high intensity of service, high risk for further deterioration and high frequency of surveillance required  Time spent: 55 minutes  Greenley Martone Sharlette Dense MD [facility] Pager [phone]  If 7PM-7AM, please contact night-coverage [email or web address] Password [facility]  12/03/2023, 5:06 PM

## 2023-12-03 NOTE — ED Notes (Signed)
 Pt unable to tolerated albuterol neb

## 2023-12-03 NOTE — ED Triage Notes (Signed)
 Pt BIB EMS coming from home c/o shob that started at night. States son had viral infection recently. C/o chest pain 6/10, Experiencing HA, cough, nausea, dizziness and backache. Upon arrival patient had wheezing.   EMS BP 134/70, HR 110, RR 30, End tidal 21 fluids, 125 solumedrol given en route. Unable to tolerate duoneb

## 2023-12-04 ENCOUNTER — Encounter (HOSPITAL_COMMUNITY): Payer: Self-pay | Admitting: Internal Medicine

## 2023-12-04 DIAGNOSIS — J4541 Moderate persistent asthma with (acute) exacerbation: Secondary | ICD-10-CM | POA: Diagnosis not present

## 2023-12-04 DIAGNOSIS — J9601 Acute respiratory failure with hypoxia: Secondary | ICD-10-CM | POA: Diagnosis not present

## 2023-12-04 DIAGNOSIS — B338 Other specified viral diseases: Secondary | ICD-10-CM | POA: Diagnosis not present

## 2023-12-04 LAB — CBC
HCT: 39.1 % (ref 36.0–46.0)
Hemoglobin: 12.9 g/dL (ref 12.0–15.0)
MCH: 28.9 pg (ref 26.0–34.0)
MCHC: 33 g/dL (ref 30.0–36.0)
MCV: 87.7 fL (ref 80.0–100.0)
Platelets: 318 10*3/uL (ref 150–400)
RBC: 4.46 MIL/uL (ref 3.87–5.11)
RDW: 14.1 % (ref 11.5–15.5)
WBC: 7.1 10*3/uL (ref 4.0–10.5)
nRBC: 0 % (ref 0.0–0.2)

## 2023-12-04 LAB — BASIC METABOLIC PANEL
Anion gap: 10 (ref 5–15)
BUN: 18 mg/dL (ref 6–20)
CO2: 17 mmol/L — ABNORMAL LOW (ref 22–32)
Calcium: 8.6 mg/dL — ABNORMAL LOW (ref 8.9–10.3)
Chloride: 110 mmol/L (ref 98–111)
Creatinine, Ser: 0.75 mg/dL (ref 0.44–1.00)
GFR, Estimated: >60 mL/min (ref >60)
Glucose, Bld: 148 mg/dL — ABNORMAL HIGH (ref 70–99)
Potassium: 5.1 mmol/L (ref 3.5–5.1)
Sodium: 137 mmol/L (ref 135–145)

## 2023-12-04 LAB — HEPATIC FUNCTION PANEL
ALT: 20 U/L (ref 0–44)
AST: 20 U/L (ref 15–41)
Albumin: 3.7 g/dL (ref 3.5–5.0)
Alkaline Phosphatase: 56 U/L (ref 38–126)
Bilirubin, Direct: <0.1 mg/dL (ref 0.0–0.2)
Total Bilirubin: 0.8 mg/dL (ref 0.0–1.2)
Total Protein: 7.2 g/dL (ref 6.5–8.1)

## 2023-12-04 MED ORDER — LEVALBUTEROL HCL 1.25 MG/0.5ML IN NEBU
1.2500 mg | INHALATION_SOLUTION | Freq: Four times a day (QID) | RESPIRATORY_TRACT | Status: DC
  Administered 2023-12-04 – 2023-12-09 (×15): 1.25 mg via RESPIRATORY_TRACT
  Filled 2023-12-04 (×19): qty 0.5

## 2023-12-04 MED ORDER — METHYLPREDNISOLONE SODIUM SUCC 40 MG IJ SOLR
40.0000 mg | Freq: Two times a day (BID) | INTRAMUSCULAR | Status: AC
  Administered 2023-12-04 – 2023-12-08 (×10): 40 mg via INTRAVENOUS
  Filled 2023-12-04 (×10): qty 1

## 2023-12-04 MED ORDER — LEVALBUTEROL HCL 0.63 MG/3ML IN NEBU
0.6300 mg | INHALATION_SOLUTION | RESPIRATORY_TRACT | Status: DC | PRN
  Administered 2023-12-04 – 2023-12-07 (×6): 0.63 mg via RESPIRATORY_TRACT
  Filled 2023-12-04 (×3): qty 3

## 2023-12-04 MED ORDER — ARFORMOTEROL TARTRATE 15 MCG/2ML IN NEBU
15.0000 ug | INHALATION_SOLUTION | Freq: Two times a day (BID) | RESPIRATORY_TRACT | Status: DC
  Administered 2023-12-04 – 2023-12-11 (×15): 15 ug via RESPIRATORY_TRACT
  Filled 2023-12-04 (×15): qty 2

## 2023-12-04 MED ORDER — IPRATROPIUM-ALBUTEROL 0.5-2.5 (3) MG/3ML IN SOLN
3.0000 mL | Freq: Four times a day (QID) | RESPIRATORY_TRACT | Status: DC
  Administered 2023-12-04: 3 mL via RESPIRATORY_TRACT
  Filled 2023-12-04: qty 3

## 2023-12-04 MED ORDER — HYDROCOD POLI-CHLORPHE POLI ER 10-8 MG/5ML PO SUER
5.0000 mL | Freq: Two times a day (BID) | ORAL | Status: DC
  Administered 2023-12-04 – 2023-12-11 (×15): 5 mL via ORAL
  Filled 2023-12-04 (×15): qty 5

## 2023-12-04 MED ORDER — BUDESONIDE 0.5 MG/2ML IN SUSP
0.5000 mg | Freq: Two times a day (BID) | RESPIRATORY_TRACT | Status: DC
  Administered 2023-12-04 – 2023-12-11 (×14): 0.5 mg via RESPIRATORY_TRACT
  Filled 2023-12-04 (×14): qty 2

## 2023-12-04 MED ORDER — DEXTROMETHORPHAN POLISTIREX ER 30 MG/5ML PO SUER: 60.0000 mg | Freq: Three times a day (TID) | ORAL | Status: DC | PRN

## 2023-12-04 MED ORDER — IPRATROPIUM-ALBUTEROL 0.5-2.5 (3) MG/3ML IN SOLN
3.0000 mL | RESPIRATORY_TRACT | Status: DC | PRN
Start: 2023-12-04 — End: 2023-12-04

## 2023-12-04 MED ORDER — IPRATROPIUM BROMIDE 0.02 % IN SOLN
0.5000 mg | Freq: Four times a day (QID) | RESPIRATORY_TRACT | Status: DC
  Administered 2023-12-04 – 2023-12-09 (×18): 0.5 mg via RESPIRATORY_TRACT
  Filled 2023-12-04 (×19): qty 2.5

## 2023-12-04 NOTE — Progress Notes (Signed)
 Progress Note    Megan Roach   ZOX:[id number]  DOB: 09-02-1976  DOA: 12/03/2023     1 PCP: Megan Putt, MD  Initial CC: SOB, cough  Hospital Course: [name] is a 48 yo female with PMH reactive airway disease, anxiety who presented with cough, wheezing, SOB.  She has been around sick family members prior to admission but states they tested negative on respiratory swabs. She was found to be positive for RSV on admission.  CXR negative for acute findings. She was started on steroids and breathing treatments and admitted for further monitoring.  Interval History:  Coughing excessively when seen this morning and still having significant amount of wheezing.  Required BiPAP since admitted and having a break when seen this morning.  Assessment and Plan: * Asthma exacerbation - Suspected from RSV infection - Does not tolerate albuterol nebulizers even with DuoNeb -Takes Symbicort at home; albuterol inhaler as needed at home -Denies much benefit from Singulair or Claritin in the past -Continue Solu-Medrol - Continue Xopenex and ipratropium nebulizers -Continue budesonide and Brovana nebs -Longstanding family history of early diagnosed COPD without smoking and states brother has some liver issues; she is requesting a1-AT testing which is reasonable, will obtain  RSV (respiratory syncytial virus infection) - See asthma exacerbation - Continue supportive care - Droplet precautions -Start on Tussionex and Delsym  Acute respiratory failure with hypoxia (HCC) - Hypoxic on room air on admission and also requiring BiPAP due to work of breathing - Has been on 2- 3L O2 when off bipap - okay for further bipap if needed - continue O2 and wean as able  Depression - Continue Lexapro and Wellbutrin - Continue Klonopin   Old records reviewed in assessment of this patient  Antimicrobials:   DVT prophylaxis:  enoxaparin (LOVENOX) injection 40 mg Start: 12/03/23 2200 SCDs  Start: 12/03/23 1705   Code Status:   Code Status: Full Code  Mobility Assessment (Last 72 Hours)     Mobility Assessment     Row Name 12/03/23 2000 12/03/23 1841         Does patient have an order for bedrest or is patient medically unstable No - Continue assessment No - Continue assessment      What is the highest level of mobility based on the progressive mobility assessment? Level 5 (Walks with assist in room/hall) - Balance while stepping forward/back and can walk in room with assist - Complete Level 6 (Walks independently in room and hall) - Balance while walking in room without assist - Complete               Barriers to discharge: none Disposition Plan:  Home HH orders placed: n/a Status is: Inpt  Objective: Blood pressure 122/75, pulse 91, temperature 98.3 F (36.8 C), temperature source Oral, resp. rate 18, height 4\' 11"  (1.499 m), weight 67.1 kg, SpO2 97%.  Examination:  Physical Exam Constitutional:      Appearance: Normal appearance.  HENT:     Head: Normocephalic and atraumatic.     Mouth/Throat:     Mouth: Mucous membranes are moist.  Eyes:     Extraocular Movements: Extraocular movements intact.  Cardiovascular:     Rate and Rhythm: Normal rate and regular rhythm.  Pulmonary:     Effort: Pulmonary effort is normal. No respiratory distress.     Breath sounds: Decreased air movement present. Wheezing present.  Abdominal:     General: Bowel sounds are normal. There is no distension.  Palpations: Abdomen is soft.     Tenderness: There is no abdominal tenderness.  Musculoskeletal:        General: Normal range of motion.     Cervical back: Normal range of motion and neck supple.  Skin:    General: Skin is warm and dry.  Neurological:     General: No focal deficit present.     Mental Status: She is alert.  Psychiatric:        Mood and Affect: Mood normal.      Consultants:    Procedures:    Data Reviewed: Results for orders placed or  performed during the hospital encounter of 12/03/23 (from the past 24 hours)  CBC     Status: None   Collection Time: 12/03/23  2:49 PM  Result Value Ref Range   WBC 8.5 4.0 - 10.5 K/uL   RBC 4.58 3.87 - 5.11 MIL/uL   Hemoglobin 13.3 12.0 - 15.0 g/dL   HCT 16.1 09.6 - 04.5 %   MCV 85.4 80.0 - 100.0 fL   MCH 29.0 26.0 - 34.0 pg   MCHC 34.0 30.0 - 36.0 g/dL   RDW 40.9 81.1 - 91.4 %   Platelets 299 150 - 400 K/uL   nRBC 0.0 0.0 - 0.2 %  Basic metabolic panel     Status: Abnormal   Collection Time: 12/03/23  2:49 PM  Result Value Ref Range   Sodium 137 135 - 145 mmol/L   Potassium 3.3 (L) 3.5 - 5.1 mmol/L   Chloride 108 98 - 111 mmol/L   CO2 18 (L) 22 - 32 mmol/L   Glucose, Bld 113 (H) 70 - 99 mg/dL   BUN 14 6 - 20 mg/dL   Creatinine, Ser 7.82 0.44 - 1.00 mg/dL   Calcium 8.5 (L) 8.9 - 10.3 mg/dL   GFR, Estimated >95 >62 mL/min   Anion gap 11 5 - 15  Resp panel by RT-PCR (RSV, Flu A&B, Covid) Anterior Nasal Swab     Status: Abnormal   Collection Time: 12/03/23  2:58 PM   Specimen: Anterior Nasal Swab  Result Value Ref Range   SARS Coronavirus 2 by RT PCR NEGATIVE NEGATIVE   Influenza A by PCR NEGATIVE NEGATIVE   Influenza B by PCR NEGATIVE NEGATIVE   Resp Syncytial Virus by PCR POSITIVE (A) NEGATIVE  Blood gas, venous (at [facility] and [facility])     Status: Abnormal   Collection Time: 12/03/23  5:13 PM  Result Value Ref Range   pH, Ven 7.43 7.25 - 7.43   pCO2, Ven 31 (L) 44 - 60 mmHg   pO2, Ven 49 (H) 32 - 45 mmHg   Bicarbonate 20.6 20.0 - 28.0 mmol/L   Acid-base deficit 2.7 (H) 0.0 - 2.0 mmol/L   O2 Saturation 85.8 %   Patient temperature 37.0   Magnesium     Status: Abnormal   Collection Time: 12/03/23  5:21 PM  Result Value Ref Range   Magnesium 3.2 (H) 1.7 - 2.4 mg/dL  HIV Antibody (routine testing w rflx)     Status: None   Collection Time: 12/03/23  5:21 PM  Result Value Ref Range   HIV Screen 4th Generation wRfx Non Reactive Non Reactive  Basic metabolic panel     Status:  Abnormal   Collection Time: 12/04/23  4:14 AM  Result Value Ref Range   Sodium 137 135 - 145 mmol/L   Potassium 5.1 3.5 - 5.1 mmol/L   Chloride 110 98 - 111  mmol/L   CO2 17 (L) 22 - 32 mmol/L   Glucose, Bld 148 (H) 70 - 99 mg/dL   BUN 18 6 - 20 mg/dL   Creatinine, Ser 1.61 0.44 - 1.00 mg/dL   Calcium 8.6 (L) 8.9 - 10.3 mg/dL   GFR, Estimated >09 >60 mL/min   Anion gap 10 5 - 15  CBC     Status: None   Collection Time: 12/04/23  4:14 AM  Result Value Ref Range   WBC 7.1 4.0 - 10.5 K/uL   RBC 4.46 3.87 - 5.11 MIL/uL   Hemoglobin 12.9 12.0 - 15.0 g/dL   HCT 45.4 09.8 - 11.9 %   MCV 87.7 80.0 - 100.0 fL   MCH 28.9 26.0 - 34.0 pg   MCHC 33.0 30.0 - 36.0 g/dL   RDW 14.7 82.9 - 56.2 %   Platelets 318 150 - 400 K/uL   nRBC 0.0 0.0 - 0.2 %  Hepatic function panel     Status: None   Collection Time: 12/04/23 10:01 AM  Result Value Ref Range   Total Protein 7.2 6.5 - 8.1 g/dL   Albumin 3.7 3.5 - 5.0 g/dL   AST 20 15 - 41 U/L   ALT 20 0 - 44 U/L   Alkaline Phosphatase 56 38 - 126 U/L   Total Bilirubin 0.8 0.0 - 1.2 mg/dL   Bilirubin, Direct <1.3 0.0 - 0.2 mg/dL   Indirect Bilirubin NOT CALCULATED 0.3 - 0.9 mg/dL    I have reviewed pertinent nursing notes, vitals, labs, and images as necessary. I have ordered labwork to follow up on as indicated.  I have reviewed the last notes from staff over past 24 hours. I have discussed patient's care plan and test results with nursing staff, CM/SW, and other staff as appropriate.  Time spent: Greater than 50% of the 55 minute visit was spent in counseling/coordination of care for the patient as laid out in the A&P.   LOS: 1 day   Lewie Chamber, MD [facility] 12/04/2023, 12:51 PM

## 2023-12-04 NOTE — Assessment & Plan Note (Addendum)
-   Suspected from RSV infection - Does not tolerate albuterol nebulizers even with DuoNeb -Takes Symbicort at home; albuterol inhaler as needed at home -Denies much benefit from Singulair or Claritin in the past -Continue Solu-Medrol - Continue Xopenex and ipratropium nebulizers -Continue budesonide and Brovana nebs -Longstanding family history of early diagnosed COPD without smoking and states brother has some liver issues; she is requesting a1-AT testing which is reasonable, will obtain - repeat Mag dose 2/18; try morphine

## 2023-12-04 NOTE — Assessment & Plan Note (Signed)
-   Hypoxic on room air on admission and also requiring BiPAP due to work of breathing - Has been on 2- 3L O2 when off bipap - okay for further bipap if needed - continue O2 and wean as able

## 2023-12-04 NOTE — Progress Notes (Signed)
 Mobility Specialist - Progress Note  During mobility: 141 bpm HR,  Post-mobility: 111 bpm HR,   Nurse requested Mobility Specialist to perform oxygen saturation test with pt which includes removing pt from oxygen both at rest and while ambulating.  Below are the results from that testing.     Patient Saturations on Room Air at Rest = spO2 91%  Patient Saturations on Room Air while Ambulating = sp02 89% .  At end of testing pt left in room on 2  Liters of oxygen.  Reported results to nurse.      12/04/23 1202  Mobility  Activity Ambulated with assistance in hallway  Level of Assistance Contact guard assist, steadying assist  Assistive Device  (HHA from husband)  Distance Ambulated (ft) 350 ft  Range of Motion/Exercises Active  Activity Response Tolerated fair  Mobility Referral Yes  Mobility visit 1 Mobility  Mobility Specialist Start Time (ACUTE ONLY) 1150  Mobility Specialist Stop Time (ACUTE ONLY) 1202  Mobility Specialist Time Calculation (min) (ACUTE ONLY) 12 min   Pt was found in bed and agreeable to ambulate. Coughing throughout session and had x1 brief standing rest break due to HR 141 bpm, able to decreased to 120s within 1 min. At EOS returned to bed with all needs met. Call bell in reach and husband in room. RN notified.  Billey Chang Mobility Specialist

## 2023-12-04 NOTE — Plan of Care (Signed)
   Problem: Clinical Measurements: Goal: Respiratory complications will improve Outcome: Progressing

## 2023-12-04 NOTE — Progress Notes (Signed)
   12/04/23 1957  Vent Select  Invasive or Noninvasive Noninvasive  Adult Vent Y  Adult Ventilator Settings  Vent Type Servo-air  Vent Mode PSV;BIPAP  FiO2 (%) 30 %  IPAP 7 cmH20  EPAP 5 cmH20  Pressure Control 12 cmH20  PEEP 2 cmH20  Adult Ventilator Measurements  Peak Airway Pressure 12 L/min  Mean Airway Pressure 8.1 cmH20  Resp Rate Spontaneous 20 br/min  Resp Rate Total 20 br/min  Spont TV 572 mL  Measured Ve 10.1 L  Adult Ventilator Alarms  Alarms On Y  Ve High Alarm 24 L/min  Ve Low Alarm 4 L/min  Resp Rate High Alarm 38 br/min  Resp Rate Low Alarm 8  PEEP Low Alarm 3 cmH2O  Press High Alarm 25 cmH2O

## 2023-12-04 NOTE — Assessment & Plan Note (Addendum)
-   See asthma exacerbation - Continue supportive care - Droplet precautions -Start on Tussionex and Delsym - still very SOB today; adding some morphine PRN for WOB

## 2023-12-04 NOTE — Hospital Course (Signed)
 Megan Roach is a 48 yo female with PMH reactive airway disease, anxiety who presented with cough, wheezing, SOB.  She has been around sick family members prior to admission but states they tested negative on respiratory swabs. She was found to be positive for RSV on admission.  CXR negative for acute findings. She was started on steroids and breathing treatments and admitted for further monitoring.

## 2023-12-04 NOTE — Assessment & Plan Note (Addendum)
-   Continue Lexapro and Wellbutrin - Continue Klonopin

## 2023-12-05 DIAGNOSIS — J9601 Acute respiratory failure with hypoxia: Secondary | ICD-10-CM | POA: Diagnosis not present

## 2023-12-05 DIAGNOSIS — J4541 Moderate persistent asthma with (acute) exacerbation: Secondary | ICD-10-CM | POA: Diagnosis not present

## 2023-12-05 DIAGNOSIS — B338 Other specified viral diseases: Secondary | ICD-10-CM | POA: Diagnosis not present

## 2023-12-05 LAB — ALPHA-1-ANTITRYPSIN: A-1 Antitrypsin, Ser: 146 mg/dL (ref 101–187)

## 2023-12-05 MED ORDER — MAGNESIUM SULFATE 2 GM/50ML IV SOLN
2.0000 g | Freq: Once | INTRAVENOUS | Status: AC
  Administered 2023-12-05: 2 g via INTRAVENOUS
  Filled 2023-12-05: qty 50

## 2023-12-05 MED ORDER — MORPHINE SULFATE (PF) 2 MG/ML IV SOLN
2.0000 mg | INTRAVENOUS | Status: DC | PRN
  Administered 2023-12-05 – 2023-12-10 (×11): 2 mg via INTRAVENOUS
  Filled 2023-12-05 (×12): qty 1

## 2023-12-05 MED ORDER — DEXTROMETHORPHAN POLISTIREX ER 30 MG/5ML PO SUER
60.0000 mg | Freq: Four times a day (QID) | ORAL | Status: DC
  Administered 2023-12-05 – 2023-12-11 (×25): 60 mg via ORAL
  Filled 2023-12-05 (×28): qty 10

## 2023-12-05 NOTE — Plan of Care (Signed)

## 2023-12-05 NOTE — Progress Notes (Signed)
 Progress Note    Megan Roach   WJX:[id number]  DOB: [birth year]  DOA: 12/03/2023     2 PCP: Mosetta Putt, MD  Initial CC: SOB, cough  Hospital Course: Megan Roach is a 48 yo female with PMH reactive airway disease, anxiety who presented with cough, wheezing, SOB.  She has been around sick family members prior to admission but states they tested negative on respiratory swabs. She was found to be positive for RSV on admission.  CXR negative for acute findings. She was started on steroids and breathing treatments and admitted for further monitoring.  Interval History:  Still minimal improvement.  Coughing and very short of breath with significant wheezing.  Ongoing increased work of breathing.  BiPAP does help.  Also some relief from Tussionex. Getting another dose of magnesium today and adding on some morphine for help with work of breathing.  Assessment and Plan: * Asthma exacerbation - Suspected from RSV infection - Does not tolerate albuterol nebulizers even with DuoNeb -Takes Symbicort at home; albuterol inhaler as needed at home -Denies much benefit from Singulair or Claritin in the past -Continue Solu-Medrol - Continue Xopenex and ipratropium nebulizers -Continue budesonide and Brovana nebs -Longstanding family history of early diagnosed COPD without smoking and states brother has some liver issues; she is requesting a1-AT testing which is reasonable, will obtain - repeat Mag dose 2/18; try morphine  RSV (respiratory syncytial virus infection) - See asthma exacerbation - Continue supportive care - Droplet precautions -Start on Tussionex and Delsym - still very SOB today; adding some morphine PRN for WOB  Acute respiratory failure with hypoxia (HCC) - Hypoxic on room air on admission and also requiring BiPAP due to work of breathing - Has been on 2- 3L O2 when off bipap - okay for further bipap if needed - continue O2 and wean as able  Depression - Continue  Lexapro and Wellbutrin - Continue Klonopin   Old records reviewed in assessment of this patient  Antimicrobials:   DVT prophylaxis:  enoxaparin (LOVENOX) injection 40 mg Start: 12/03/23 2200 SCDs Start: 12/03/23 1705   Code Status:   Code Status: Full Code  Mobility Assessment (Last 72 Hours)     Mobility Assessment     Row Name 12/05/23 0859 12/04/23 1945 12/04/23 0850 12/03/23 2000 12/03/23 1841   Does patient have an order for bedrest or is patient medically unstable No - Continue assessment No - Continue assessment No - Continue assessment No - Continue assessment No - Continue assessment   What is the highest level of mobility based on the progressive mobility assessment? Level 5 (Walks with assist in room/hall) - Balance while stepping forward/back and can walk in room with assist - Complete Level 5 (Walks with assist in room/hall) - Balance while stepping forward/back and can walk in room with assist - Complete Level 5 (Walks with assist in room/hall) - Balance while stepping forward/back and can walk in room with assist - Complete Level 5 (Walks with assist in room/hall) - Balance while stepping forward/back and can walk in room with assist - Complete Level 6 (Walks independently in room and hall) - Balance while walking in room without assist - Complete            Barriers to discharge: none Disposition Plan:  Home HH orders placed: n/a Status is: Inpt  Objective: Blood pressure 117/73, pulse 92, temperature 98.5 F (36.9 C), temperature source Oral, resp. rate 18, height 4\' 11"  (1.499 m), weight 67.1 kg, SpO2 99%.  Examination:  Physical Exam Constitutional:      Appearance: Normal appearance.  HENT:     Head: Normocephalic and atraumatic.     Mouth/Throat:     Mouth: Mucous membranes are moist.  Eyes:     Extraocular Movements: Extraocular movements intact.  Cardiovascular:     Rate and Rhythm: Normal rate and regular rhythm.  Pulmonary:     Effort:  Pulmonary effort is normal. No respiratory distress.     Breath sounds: Decreased air movement present. Wheezing present.  Abdominal:     General: Bowel sounds are normal. There is no distension.     Palpations: Abdomen is soft.     Tenderness: There is no abdominal tenderness.  Musculoskeletal:        General: Normal range of motion.     Cervical back: Normal range of motion and neck supple.  Skin:    General: Skin is warm and dry.  Neurological:     General: No focal deficit present.     Mental Status: She is alert.  Psychiatric:        Mood and Affect: Mood normal.      Consultants:    Procedures:    Data Reviewed: No results found for this or any previous visit (from the past 24 hours).   I have reviewed pertinent nursing notes, vitals, labs, and images as necessary. I have ordered labwork to follow up on as indicated.  I have reviewed the last notes from staff over past 24 hours. I have discussed patient's care plan and test results with nursing staff, CM/SW, and other staff as appropriate.  Time spent: Greater than 50% of the 55 minute visit was spent in counseling/coordination of care for the patient as laid out in the A&P.   LOS: 2 days   Lewie Chamber, MD [facility] 12/05/2023, 2:04 PM

## 2023-12-06 DIAGNOSIS — J210 Acute bronchiolitis due to respiratory syncytial virus: Secondary | ICD-10-CM | POA: Diagnosis not present

## 2023-12-06 NOTE — Progress Notes (Signed)
   12/06/23 0031  BiPAP/CPAP/SIPAP  BiPAP/CPAP/SIPAP SERVO  Mask Type Full face mask  IPAP 12 cmH20  EPAP 5 cmH2O  FiO2 (%) 30 %  Minute Ventilation 8.3  Leak 5  Peak Inspiratory Pressure (PIP) 22  Tidal Volume (Vt) 425  Patient Home Equipment No  Auto Titrate No  Press High Alarm 25 cmH2O  Press Low Alarm 5 cmH2O  CPAP/SIPAP surface wiped down Yes  BiPAP/CPAP /SiPAP Vitals  Pulse Rate 98  Resp (!) 21  BP 119/83  Bilateral Breath Sounds Diminished  MEWS Score/Color  MEWS Score 1  MEWS Score Color Green

## 2023-12-06 NOTE — Progress Notes (Signed)
 PROGRESS NOTE    [name]  XLK:[id number] DOB: 07-Jul-1976 DOA: 12/03/2023 PCP: Mosetta Putt, MD  Chief Complaint  Patient presents with   Shortness of Breath    Brief Narrative:   Ms. Megan Roach is Megan Roach 48 yo female with PMH reactive airway disease, anxiety who presented with cough, wheezing, SOB.  She has been around sick family members prior to admission but states they tested negative on respiratory swabs. She was found to be positive for RSV on admission.  CXR negative for acute findings. She was started on steroids and breathing treatments and admitted for further monitoring.  Assessment & Plan:   Principal Problem:   Asthma exacerbation Active Problems:   RSV (respiratory syncytial virus infection)   Acute respiratory failure with hypoxia (HCC)   Depression  Asthma exacerbation due to RSV infection - Does not tolerate albuterol nebulizers even with DuoNeb -Takes Symbicort at home; albuterol inhaler as needed at home -Denies much benefit from Singulair or Claritin in the past -Continue Solu-Medrol - Continue Xopenex and ipratropium nebulizers -Continue budesonide and Brovana nebs -alpha 1 anti trypsin wnl    RSV (respiratory syncytial virus infection) - See asthma exacerbation - Continue supportive care - Droplet precautions -Start on Tussionex and Delsym   Acute respiratory failure with hypoxia (HCC) - currently on RA, still with diffuse wheezing - expect her to improve gradually over next several days   Depression - Continue Lexapro and Wellbutrin - Continue Klonopin      DVT prophylaxis: lovenox Code Status: full Family Communication: none Disposition:   Status is: Inpatient Remains inpatient appropriate because: need for inpatient care   Consultants:  none  Procedures:  none  Antimicrobials:  Anti-infectives (From admission, onward)    None       Subjective: SOB  Objective: Vitals:   12/06/23 0854 12/06/23 0924 12/06/23 1232  12/06/23 1337  BP:  (!) 128/94  134/83  Pulse:  95  (!) 104  Resp:  18  20  Temp:  98.7 F (37.1 C)  99 F (37.2 C)  TempSrc:  Oral  Oral  SpO2: 96% 98% 94% 98%  Weight:      Height:        Intake/Output Summary (Last 24 hours) at 12/06/2023 1719 Last data filed at 12/06/2023 1300 Gross per 24 hour  Intake 480 ml  Output --  Net 480 ml   Filed Weights   12/03/23 1440  Weight: 67.1 kg    Examination:  General exam: Appears uncomfortable, SOB Respiratory system: audible wheezing from bedside Cardiovascular system:RRR Gastrointestinal system: Abdomen is nondistended, soft and nontender.  Central nervous system: Alert and oriented. No focal neurological deficits. Extremities: no LEE  Data Reviewed: I have personally reviewed following labs and imaging studies  CBC: Recent Labs  Lab 12/03/23 1449 12/04/23 0414  WBC 8.5 7.1  HGB 13.3 12.9  HCT 39.1 39.1  MCV 85.4 87.7  PLT 299 318    Basic Metabolic Panel: Recent Labs  Lab 12/03/23 1449 12/03/23 1721 12/04/23 0414  NA 137  --  137  K 3.3*  --  5.1  CL 108  --  110  CO2 18*  --  17*  GLUCOSE 113*  --  148*  BUN 14  --  18  CREATININE 0.91  --  0.75  CALCIUM 8.5*  --  8.6*  MG  --  3.2*  --     GFR: Estimated Creatinine Clearance: 70.9 mL/min (by C-G formula based on SCr of 0.75  mg/dL).  Liver Function Tests: Recent Labs  Lab 12/04/23 1001  AST 20  ALT 20  ALKPHOS 56  BILITOT 0.8  PROT 7.2  ALBUMIN 3.7    CBG: No results for input(s): "GLUCAP" in the last 168 hours.   Recent Results (from the past 240 hours)  Resp panel by RT-PCR (RSV, Flu Nadeen Shipman&B, Covid) Anterior Nasal Swab     Status: Abnormal   Collection Time: 12/03/23  2:58 PM   Specimen: Anterior Nasal Swab  Result Value Ref Range Status   SARS Coronavirus 2 by RT PCR NEGATIVE NEGATIVE Final    Comment: (NOTE) SARS-CoV-2 target nucleic acids are NOT DETECTED.  The SARS-CoV-2 RNA is generally detectable in upper  respiratory specimens during the acute phase of infection. The lowest concentration of SARS-CoV-2 viral copies this assay can detect is 138 copies/mL. Val Schiavo negative result does not preclude SARS-Cov-2 infection and should not be used as the sole basis for treatment or other patient management decisions. Jayli Fogleman negative result may occur with  improper specimen collection/handling, submission of specimen other than nasopharyngeal swab, presence of viral mutation(s) within the areas targeted by this assay, and inadequate number of viral copies(<138 copies/mL). Goku Harb negative result must be combined with clinical observations, patient history, and epidemiological information. The expected result is Negative.  Fact Sheet for Patients:  BloggerCourse.com  Fact Sheet for Healthcare Providers:  SeriousBroker.it  This test is no t yet approved or cleared by the [place] FDA and  has been authorized for detection and/or diagnosis of SARS-CoV-2 by FDA under an Emergency Use Authorization (EUA). This EUA will remain  in effect (meaning this test can be used) for the duration of the COVID-19 declaration under Section 564(b)(1) of the Act, 21 U.S.C.section 360bbb-3(b)(1), unless the authorization is terminated  or revoked sooner.       Influenza Nikoli Nasser by PCR NEGATIVE NEGATIVE Final   Influenza B by PCR NEGATIVE NEGATIVE Final    Comment: (NOTE) The Xpert Xpress SARS-CoV-2/FLU/RSV plus assay is intended as an aid in the diagnosis of influenza from Nasopharyngeal swab specimens and should not be used as Kiran Lapine sole basis for treatment. Nasal washings and aspirates are unacceptable for Xpert Xpress SARS-CoV-2/FLU/RSV testing.  Fact Sheet for Patients: BloggerCourse.com  Fact Sheet for Healthcare Providers: SeriousBroker.it  This test is not yet approved or cleared by the [place] FDA and has been  authorized for detection and/or diagnosis of SARS-CoV-2 by FDA under an Emergency Use Authorization (EUA). This EUA will remain in effect (meaning this test can be used) for the duration of the COVID-19 declaration under Section 564(b)(1) of the Act, 21 U.S.C. section 360bbb-3(b)(1), unless the authorization is terminated or revoked.     Resp Syncytial Virus by PCR POSITIVE (Verlisa Vara) NEGATIVE Final    Comment: (NOTE) Fact Sheet for Patients: BloggerCourse.com  Fact Sheet for Healthcare Providers: SeriousBroker.it  This test is not yet approved or cleared by the [place] FDA and has been authorized for detection and/or diagnosis of SARS-CoV-2 by FDA under an Emergency Use Authorization (EUA). This EUA will remain in effect (meaning this test can be used) for the duration of the COVID-19 declaration under Section 564(b)(1) of the Act, 21 U.S.C. section 360bbb-3(b)(1), unless the authorization is terminated or revoked.  Performed at [facility], [address], [place], [place] [address]          Radiology Studies: No results found.      Scheduled Meds:  arformoterol  15 mcg Nebulization  BID   budesonide (PULMICORT) nebulizer solution  0.5 mg Nebulization BID   buPROPion  75 mg Oral Daily   chlorpheniramine-HYDROcodone  5 mL Oral BID   dextromethorphan  60 mg Oral QID   enoxaparin (LOVENOX) injection  40 mg Subcutaneous Q24H   escitalopram  15 mg Oral Q breakfast   ipratropium  0.5 mg Nebulization QID   levalbuterol  1.25 mg Nebulization QID   methylPREDNISolone (SOLU-MEDROL) injection  40 mg Intravenous BID   pantoprazole  40 mg Oral QHS   Continuous Infusions:   LOS: 3 days    Time spent: over 30 min    Lacretia Nicks, MD [facility]   To contact the attending provider between 7A-7P or the covering provider during after hours 7P-7A, please log into the web site [email or web address]  and access using universal [place] password for that web site. If you do not have the password, please call the hospital operator.  12/06/2023, 5:19 PM

## 2023-12-07 ENCOUNTER — Inpatient Hospital Stay (HOSPITAL_COMMUNITY): Payer: BC Managed Care – PPO

## 2023-12-07 ENCOUNTER — Telehealth: Payer: Self-pay | Admitting: Pulmonary Disease

## 2023-12-07 DIAGNOSIS — J210 Acute bronchiolitis due to respiratory syncytial virus: Secondary | ICD-10-CM | POA: Diagnosis not present

## 2023-12-07 DIAGNOSIS — R062 Wheezing: Secondary | ICD-10-CM

## 2023-12-07 LAB — BASIC METABOLIC PANEL
Anion gap: 8 (ref 5–15)
BUN: 25 mg/dL — ABNORMAL HIGH (ref 6–20)
CO2: 25 mmol/L (ref 22–32)
Calcium: 9 mg/dL (ref 8.9–10.3)
Chloride: 103 mmol/L (ref 98–111)
Creatinine, Ser: 1 mg/dL (ref 0.44–1.00)
GFR, Estimated: >60 mL/min (ref >60)
Glucose, Bld: 127 mg/dL — ABNORMAL HIGH (ref 70–99)
Potassium: 4.3 mmol/L (ref 3.5–5.1)
Sodium: 136 mmol/L (ref 135–145)

## 2023-12-07 LAB — PHOSPHORUS: Phosphorus: 3.6 mg/dL (ref 2.5–4.6)

## 2023-12-07 LAB — CBC
HCT: 39.8 % (ref 36.0–46.0)
Hemoglobin: 13 g/dL (ref 12.0–15.0)
MCH: 29 pg (ref 26.0–34.0)
MCHC: 32.7 g/dL (ref 30.0–36.0)
MCV: 88.8 fL (ref 80.0–100.0)
Platelets: 331 10*3/uL (ref 150–400)
RBC: 4.48 MIL/uL (ref 3.87–5.11)
RDW: 14.3 % (ref 11.5–15.5)
WBC: 11 10*3/uL — ABNORMAL HIGH (ref 4.0–10.5)
nRBC: 0.2 % (ref 0.0–0.2)

## 2023-12-07 LAB — MAGNESIUM: Magnesium: 2.2 mg/dL (ref 1.7–2.4)

## 2023-12-07 MED ORDER — GUAIFENESIN ER 600 MG PO TB12
1200.0000 mg | ORAL_TABLET | Freq: Two times a day (BID) | ORAL | Status: DC
  Administered 2023-12-07 – 2023-12-11 (×9): 1200 mg via ORAL
  Filled 2023-12-07 (×9): qty 2

## 2023-12-07 NOTE — Progress Notes (Signed)
 Pt placed on BIPAP for SOB and WOB.

## 2023-12-07 NOTE — Progress Notes (Signed)
 PROGRESS NOTE    Megan Roach  ZOX:[id number] DOB: Apr 10, 1976 DOA: 12/03/2023 PCP: Mosetta Putt, MD  Chief Complaint  Patient presents with   Shortness of Breath    Brief Narrative:   Megan Roach is Megan Roach 48 yo female with PMH reactive airway disease, anxiety who presented with cough, wheezing, SOB.  She has been around sick family members prior to admission but states they tested negative on respiratory swabs. She was found to be positive for RSV on admission.  CXR negative for acute findings. She was started on steroids and breathing treatments and admitted for further monitoring.  Assessment & Plan:   Principal Problem:   Asthma exacerbation Active Problems:   RSV (respiratory syncytial virus infection)   Acute respiratory failure with hypoxia (HCC)   Depression   RSV (acute bronchiolitis due to respiratory syncytial virus)  Asthma exacerbation due to RSV infection - did not tolerate albuterol neb (sounds like coughing fits?) -- continues on RA, but increased WOB and audible wheezing from bedside -- Takes Symbicort at home; albuterol inhaler as needed at home -- Denies much benefit from Singulair or Claritin in the past -- Solu-Medrol.  Xopenex and ipratropium. Budesonide, brovana. -- will plan for CT chest.  Low suspicion for superimposed bacterial pneumonia at this time, consider abx as indicated. -- will ask pulm for input given her persistent symptoms -- alpha 1 anti trypsin wnl    RSV (respiratory syncytial virus infection) - See asthma exacerbation - Continue supportive care - Droplet precautions -Start on Tussionex and Delsym   Acute respiratory failure with hypoxia (HCC) - currently on RA, still with diffuse wheezing - expect her to improve gradually over next several days   Depression - Continue Lexapro and Wellbutrin - Continue Klonopin      DVT prophylaxis: lovenox Code Status: full Family Communication: none Disposition:   Status is:  Inpatient Remains inpatient appropriate because: need for inpatient care   Consultants:  none  Procedures:  none  Antimicrobials:  Anti-infectives (From admission, onward)    None       Subjective: Continued SOB Husband at bedside with some questions this morning  Objective: Vitals:   12/07/23 0410 12/07/23 0753 12/07/23 0755 12/07/23 1153  BP: (!) 131/94     Pulse: 99     Resp: 18     Temp: 98.3 F (36.8 C)     TempSrc: Oral     SpO2: 90% 96% 96% 97%  Weight:      Height:        Intake/Output Summary (Last 24 hours) at 12/07/2023 1227 Last data filed at 12/06/2023 1300 Gross per 24 hour  Intake 120 ml  Output --  Net 120 ml   Filed Weights   12/03/23 1440  Weight: 67.1 kg    Examination:  General: No acute distress. Cardiovascular: RRR Lungs: audible wheezing from bedside, increased wob - frequent cough Neurological: Alert and oriented 3. Moves all extremities 4 with equal strength. Cranial nerves II through XII grossly intact. Extremities: No clubbing or cyanosis. No edema.   Data Reviewed: I have personally reviewed following labs and imaging studies  CBC: Recent Labs  Lab 12/03/23 1449 12/04/23 0414 12/07/23 0422  WBC 8.5 7.1 11.0*  HGB 13.3 12.9 13.0  HCT 39.1 39.1 39.8  MCV 85.4 87.7 88.8  PLT 299 318 331    Basic Metabolic Panel: Recent Labs  Lab 12/03/23 1449 12/03/23 1721 12/04/23 0414 12/07/23 0422  NA 137  --  137 136  K 3.3*  --  5.1 4.3  CL 108  --  110 103  CO2 18*  --  17* 25  GLUCOSE 113*  --  148* 127*  BUN 14  --  18 25*  CREATININE 0.91  --  0.75 1.00  CALCIUM 8.5*  --  8.6* 9.0  MG  --  3.2*  --  2.2  PHOS  --   --   --  3.6    GFR: Estimated Creatinine Clearance: 56.7 mL/min (by C-G formula based on SCr of 1 mg/dL).  Liver Function Tests: Recent Labs  Lab 12/04/23 1001  AST 20  ALT 20  ALKPHOS 56  BILITOT 0.8  PROT 7.2  ALBUMIN 3.7    CBG: No results for input(s): "GLUCAP" in the last 168  hours.   Recent Results (from the past 240 hours)  Resp panel by RT-PCR (RSV, Flu Cielo Arias&B, Covid) Anterior Nasal Swab     Status: Abnormal   Collection Time: 12/03/23  2:58 PM   Specimen: Anterior Nasal Swab  Result Value Ref Range Status   SARS Coronavirus 2 by RT PCR NEGATIVE NEGATIVE Final    Comment: (NOTE) SARS-CoV-2 target nucleic acids are NOT DETECTED.  The SARS-CoV-2 RNA is generally detectable in upper respiratory specimens during the acute phase of infection. The lowest concentration of SARS-CoV-2 viral copies this assay can detect is 138 copies/mL. Meredith Kilbride negative result does not preclude SARS-Cov-2 infection and should not be used as the sole basis for treatment or other patient management decisions. Lannette Avellino negative result may occur with  improper specimen collection/handling, submission of specimen other than nasopharyngeal swab, presence of viral mutation(s) within the areas targeted by this assay, and inadequate number of viral copies(<138 copies/mL). Dantavious Snowball negative result must be combined with clinical observations, patient history, and epidemiological information. The expected result is Negative.  Fact Sheet for Patients:  BloggerCourse.com  Fact Sheet for Healthcare Providers:  SeriousBroker.it  This test is no t yet approved or cleared by the [place] FDA and  has been authorized for detection and/or diagnosis of SARS-CoV-2 by FDA under an Emergency Use Authorization (EUA). This EUA will remain  in effect (meaning this test can be used) for the duration of the COVID-19 declaration under Section 564(b)(1) of the Act, 21 U.S.C.section 360bbb-3(b)(1), unless the authorization is terminated  or revoked sooner.       Influenza Kamilla Hands by PCR NEGATIVE NEGATIVE Final   Influenza B by PCR NEGATIVE NEGATIVE Final    Comment: (NOTE) The Xpert Xpress SARS-CoV-2/FLU/RSV plus assay is intended as an aid in the diagnosis of influenza  from Nasopharyngeal swab specimens and should not be used as Chetan Mehring sole basis for treatment. Nasal washings and aspirates are unacceptable for Xpert Xpress SARS-CoV-2/FLU/RSV testing.  Fact Sheet for Patients: BloggerCourse.com  Fact Sheet for Healthcare Providers: SeriousBroker.it  This test is not yet approved or cleared by the [place] FDA and has been authorized for detection and/or diagnosis of SARS-CoV-2 by FDA under an Emergency Use Authorization (EUA). This EUA will remain in effect (meaning this test can be used) for the duration of the COVID-19 declaration under Section 564(b)(1) of the Act, 21 U.S.C. section 360bbb-3(b)(1), unless the authorization is terminated or revoked.     Resp Syncytial Virus by PCR POSITIVE (Neasia Fleeman) NEGATIVE Final    Comment: (NOTE) Fact Sheet for Patients: BloggerCourse.com  Fact Sheet for Healthcare Providers: SeriousBroker.it  This test is not yet approved or cleared by the [place] and  has been authorized for detection and/or diagnosis of SARS-CoV-2 by FDA under an Emergency Use Authorization (EUA). This EUA will remain in effect (meaning this test can be used) for the duration of the COVID-19 declaration under Section 564(b)(1) of the Act, 21 U.S.C. section 360bbb-3(b)(1), unless the authorization is terminated or revoked.  Performed at [facility], [address], [place], [place] [address]          Radiology Studies: No results found.      Scheduled Meds:  arformoterol  15 mcg Nebulization BID   budesonide (PULMICORT) nebulizer solution  0.5 mg Nebulization BID   buPROPion  75 mg Oral Daily   chlorpheniramine-HYDROcodone  5 mL Oral BID   dextromethorphan  60 mg Oral QID   enoxaparin (LOVENOX) injection  40 mg Subcutaneous Q24H   escitalopram  15 mg Oral Q breakfast   ipratropium  0.5 mg  Nebulization QID   levalbuterol  1.25 mg Nebulization QID   methylPREDNISolone (SOLU-MEDROL) injection  40 mg Intravenous BID   pantoprazole  40 mg Oral QHS   Continuous Infusions:   LOS: 4 days    Time spent: over 30 min    Lacretia Nicks, MD [facility]   To contact the attending provider between 7A-7P or the covering provider during after hours 7P-7A, please log into the web site [email or web address] and access using universal [place] password for that web site. If you do not have the password, please call the hospital operator.  12/07/2023, 12:27 PM

## 2023-12-07 NOTE — Telephone Encounter (Signed)
 Please arrange hospital follow-up in the next 4 to 8 weeks, asthma exacerbation due to RSV.  Given underlying asthma, ideally with Dr. Celine Mans but okay for any provider.  Thank you.

## 2023-12-07 NOTE — Consult Note (Signed)
 NAME:  Megan Roach, MRN:  [id number], DOB:  [birth year], LOS: 4 ADMISSION DATE:  12/03/2023, CONSULTATION DATE:  2/20 REFERRING MD:  Dr. Lowell Guitar CHIEF COMPLAINT:   SOB  History of Present Illness:  48 year old female with past medical history as below, which is significant for asthma and anxiety.  She was initially diagnosed with asthma as an adult and did have spirometry at some point but is unclear what the results.  Symptoms seem to always be exacerbated after a respiratory infection in the winter.  Does not seem to have much reactivity to allergies.  When upper airway symptoms do arise they can linger for months specifically the cough.  She cannot take albuterol due to full body convulsions.  She has been on leave albuterol and Atrovent with varying effect.  Also has complex anxiety history which has been difficult to manage.  Currently on Lexapro and Wellbutrin.  There is some concern for bipolar disorder as well.    She presented to [facility] emergency department 2/16 with complaints of cough, wheeze, and shortness of breath.  Symptoms started after her son and husband had both been sick with respiratory illness presumably viral.  She developed the symptoms and despite over-the-counter treatment and time she did not improve.  Viral testing was positive for RSV and emergency department.  Expiratory wheeze was pronounced and she was admitted to the hospitalist service for management of clear hypoxic respiratory failure secondary to RSV and asthma exacerbation.  Despite the usual treatments she was not improving and requiring BiPAP at times.  PCCM was asked to evaluate.   Pertinent  Medical History   has a past medical history of Asthma.   Significant Hospital Events: Including procedures, antibiotic start and stop dates in addition to other pertinent events     Interim History / Subjective:    Objective   Blood pressure (!) 131/94, pulse 99, temperature 98.3 F (36.8 C), temperature  source Oral, resp. rate 18, height 4\' 11"  (1.499 m), weight 67.1 kg, SpO2 97%.    FiO2 (%):  [30 %] 30 %  No intake or output data in the 24 hours ending 12/07/23 1327 Filed Weights   12/03/23 1440  Weight: 67.1 kg    Examination: General: Adult female in mild respiratory distress HENT: Normocephalic, atraumatic, PERRL, no JVD Lungs: Coarse expiratory wheeze most notably over the neck and transmitted throughout bilateral lung fields.  Tachypnea. Cardiovascular: Tachycardic, regular, no MRG Abdomen: Soft, nontender, nondistended Extremities: No acute deformity, range of motion limitation, or edema Neuro: Alert, oriented, nonfocal   Resolved Hospital Problem list     Assessment & Plan:   Acute respiratory failure with hypoxia secondary to RSV infection.  She also has pleuritic chest pain which is limiting deep inspiration I suspect there is a component of splinting and atelectasis. -Supplemental oxygen to keep oxygen saturation greater than 95% -As needed BiPAP -CT chest pending -Pulmonary hygiene and incentive spirometry -Supportive care  Wheeze: She does carry a diagnosis of asthma diagnosis uncontrolled and has had spirometry at some point in the past.  This may certainly represent exacerbation of asthma or reactive airway disease, however, she does carry a significant anxiety history which is currently uncontrolled and with wheeze being loudest at the level of vocal cords very possible there is a component of vocal cord dysfunction.  Alpha-1 antitrypsin unremarkable Possible asthma exacerbation Possible VCD -Brovana, budesonide, Atrovent  lieu of home Symbicort -Reportedly does not tolerate albuterol well, avoid. PRN levalbuterol -Systemic steroids -Pain  management and anxiolytics.  -PRN clonazepam is ordered, but she has received no doses.  -Will ultimately need outpatient pulmonary workup -Cough suppression -May need ENT eval of vocal cords if not improving with typical  asthma tx.   Anxiety disorder Some concern for bipolar disorder by outpatient notes. Patient reports hallucinations since starting to wean Wellbutrin about a week ago -Medications per primary -May benefit from inpatient psychiatry evaluation    Best Practice (right click and "Reselect all SmartList Selections" daily)   Per primary  Labs   CBC: Recent Labs  Lab 12/03/23 1449 12/04/23 0414 12/07/23 0422  WBC 8.5 7.1 11.0*  HGB 13.3 12.9 13.0  HCT 39.1 39.1 39.8  MCV 85.4 87.7 88.8  PLT 299 318 331    Basic Metabolic Panel: Recent Labs  Lab 12/03/23 1449 12/03/23 1721 12/04/23 0414 12/07/23 0422  NA 137  --  137 136  K 3.3*  --  5.1 4.3  CL 108  --  110 103  CO2 18*  --  17* 25  GLUCOSE 113*  --  148* 127*  BUN 14  --  18 25*  CREATININE 0.91  --  0.75 1.00  CALCIUM 8.5*  --  8.6* 9.0  MG  --  3.2*  --  2.2  PHOS  --   --   --  3.6   GFR: Estimated Creatinine Clearance: 56.7 mL/min (by C-G formula based on SCr of 1 mg/dL). Recent Labs  Lab 12/03/23 1449 12/04/23 0414 12/07/23 0422  WBC 8.5 7.1 11.0*    Liver Function Tests: Recent Labs  Lab 12/04/23 1001  AST 20  ALT 20  ALKPHOS 56  BILITOT 0.8  PROT 7.2  ALBUMIN 3.7   No results for input(s): "LIPASE", "AMYLASE" in the last 168 hours. No results for input(s): "AMMONIA" in the last 168 hours.  ABG    Component Value Date/Time   HCO3 20.6 12/03/2023 1713   TCO2 26 01/29/2022 1103   ACIDBASEDEF 2.7 (H) 12/03/2023 1713   O2SAT 85.8 12/03/2023 1713     Coagulation Profile: No results for input(s): "INR", "PROTIME" in the last 168 hours.  Cardiac Enzymes: No results for input(s): "CKTOTAL", "CKMB", "CKMBINDEX", "TROPONINI" in the last 168 hours.  HbA1C: Hgb A1c MFr Bld  Date/Time Value Ref Range Status  12/03/2021 09:35 AM 5.4 4.6 - 6.5 % Final    Comment:    Glycemic Control Guidelines for People with Diabetes:Non Diabetic:  <6%Goal of Therapy: <7%Additional Action Suggested:  >8%      CBG: No results for input(s): "GLUCAP" in the last 168 hours.  Review of Systems:   Bolds are positive  Constitutional: weight loss, gain, night sweats, Fevers, chills, fatigue .  HEENT: headaches, Sore throat, sneezing, nasal congestion, post nasal drip, Difficulty swallowing, Tooth/dental problems, visual complaints visual changes, ear ache CV:  chest pain, radiates:,Orthopnea, PND, swelling in lower extremities, dizziness, palpitations, syncope.  GI  heartburn, indigestion, abdominal pain, nausea, vomiting, diarrhea, change in bowel habits, loss of appetite, bloody stools.  Resp: cough, productive: , hemoptysis, dyspnea, chest pain, pleuritic.  Skin: rash or itching or icterus GU: dysuria, change in color of urine, urgency or frequency. flank pain, hematuria  MS: joint pain or swelling. decreased range of motion  Psych: change in mood or affect. depression or anxiety.  Neuro: difficulty with speech, weakness, numbness, ataxia    Past Medical History:  She,  has a past medical history of Asthma.   Surgical History:   Past Surgical  History:  Procedure Laterality Date   CESAREAN SECTION     total of 3   RECTAL PROLAPSE REPAIR     TOTAL VAGINAL HYSTERECTOMY       Social History:   reports that she has never smoked. She has never used smokeless tobacco. She reports current alcohol use of about 1.0 standard drink of alcohol per week. She reports that she does not use drugs.   Family History:  Her family history includes ADD / ADHD in her son; Alcohol abuse in her brother; Anxiety disorder in her son; Asthma in her mother; Bladder Cancer in her father; Cervical cancer in an other family member; Depression in her mother; Gallbladder disease in her brother; Idiopathic pulmonary fibrosis in her father; Liver disease in her brother. There is no history of Colon cancer, Colon polyps, Esophageal cancer, Stomach cancer, Pancreatic cancer, or Rectal cancer.   Allergies Allergies   Allergen Reactions   Sulfa Antibiotics Swelling     Home Medications  Prior to Admission medications   Medication Sig Start Date End Date Taking? Authorizing Provider  albuterol (VENTOLIN HFA) 108 (90 Base) MCG/ACT inhaler Inhale 2 puffs into the lungs every 4 (four) hours as needed. 11/02/22  Yes [provider]  clonazePAM (KLONOPIN) 0.5 MG tablet Take 1 tablet (0.5 mg total) by mouth daily as needed for anxiety. Should last 30 days , use only for emergencies 12/01/23 12/31/23 Yes Eappen, Levin Bacon, MD  escitalopram (LEXAPRO) 10 MG tablet Take 1.5 tablets (15 mg total) by mouth daily with breakfast. 07/18/23  Yes Eappen, Saramma, MD  hyoscyamine (LEVSIN SL) 0.125 MG SL tablet Place 0.125 mg under the tongue every 6 (six) hours as needed for cramping. 09/21/23  Yes [provider]  pantoprazole (PROTONIX) 40 MG tablet Take 40 mg by mouth at bedtime. 11/14/22  Yes [provider]  SUMAtriptan (IMITREX) 50 MG tablet Take 50 mg by mouth every 2 (two) hours as needed for migraine. 09/21/23  Yes [provider]  traZODone (DESYREL) 50 MG tablet Take 0.5 tablets (25 mg total) by mouth at bedtime as needed for sleep. 10/10/23  Yes Thresa Ross, MD  VITAMIN D PO Take 1 tablet by mouth daily.   Yes [provider]  azithromycin (ZITHROMAX) 250 MG tablet Take 250 mg by mouth daily. Patient not taking: Reported on 12/03/2023 11/30/23   [provider]  budesonide-formoterol (SYMBICORT) 160-4.5 MCG/ACT inhaler Inhale 2 puffs into the lungs daily. Patient not taking: Reported on 12/03/2023 11/30/22   [provider]  buPROPion (WELLBUTRIN XL) 150 MG 24 hr tablet Take 150 mg by mouth daily. Patient not taking: Reported on 12/03/2023    [provider]  buPROPion (WELLBUTRIN) 75 MG tablet Take 1 tablet (75 mg total) by mouth in the morning for 15 days. Patient not taking: Reported on 12/03/2023 12/01/23 12/16/23  Jomarie Longs, MD  estradiol  (MINIVELLE) 0.05 MG/24HR patch Apply 1 patch twice a week by transdermal route. Patient not taking: Reported on 12/03/2023 05/18/22   [provider]     Critical care time:     Joneen Roach, AGACNP-BC [facility]  See Amion for personal pager PCCM on call pager [phone] until 7pm. Please call Elink 7p-7a. [phone]  12/07/2023 1:48 PM

## 2023-12-07 NOTE — Progress Notes (Signed)
   12/07/23 2247  BiPAP/CPAP/SIPAP  BiPAP/CPAP/SIPAP Pt Type Adult  BiPAP/CPAP/SIPAP SERVO  Mask Type Full face mask  Dentures removed? Not applicable  Mask Size Medium  IPAP 12 cmH20  EPAP 5 cmH2O  FiO2 (%) 30 %  Minute Ventilation 12.1  Leak 18  Peak Inspiratory Pressure (PIP) 12  Tidal Volume (Vt) 534  Patient Home Equipment No  Auto Titrate No  Press High Alarm 25 cmH2O  Press Low Alarm 5 cmH2O

## 2023-12-08 ENCOUNTER — Other Ambulatory Visit: Payer: Self-pay | Admitting: Psychiatry

## 2023-12-08 DIAGNOSIS — F0630 Mood disorder due to known physiological condition, unspecified: Secondary | ICD-10-CM

## 2023-12-08 DIAGNOSIS — F410 Panic disorder [episodic paroxysmal anxiety] without agoraphobia: Secondary | ICD-10-CM

## 2023-12-08 DIAGNOSIS — J210 Acute bronchiolitis due to respiratory syncytial virus: Secondary | ICD-10-CM | POA: Diagnosis not present

## 2023-12-08 LAB — COMPREHENSIVE METABOLIC PANEL
ALT: 65 U/L — ABNORMAL HIGH (ref 0–44)
AST: 30 U/L (ref 15–41)
Albumin: 3.6 g/dL (ref 3.5–5.0)
Alkaline Phosphatase: 57 U/L (ref 38–126)
Anion gap: 11 (ref 5–15)
BUN: 25 mg/dL — ABNORMAL HIGH (ref 6–20)
CO2: 24 mmol/L (ref 22–32)
Calcium: 9.1 mg/dL (ref 8.9–10.3)
Chloride: 99 mmol/L (ref 98–111)
Creatinine, Ser: 0.86 mg/dL (ref 0.44–1.00)
GFR, Estimated: >60 mL/min (ref >60)
Glucose, Bld: 130 mg/dL — ABNORMAL HIGH (ref 70–99)
Potassium: 4.6 mmol/L (ref 3.5–5.1)
Sodium: 134 mmol/L — ABNORMAL LOW (ref 135–145)
Total Bilirubin: 0.7 mg/dL (ref 0.0–1.2)
Total Protein: 6.9 g/dL (ref 6.5–8.1)

## 2023-12-08 LAB — CBC WITH DIFFERENTIAL/PLATELET
Abs Immature Granulocytes: 0.63 10*3/uL — ABNORMAL HIGH (ref 0.00–0.07)
Basophils Absolute: 0.1 10*3/uL (ref 0.0–0.1)
Basophils Relative: 1 %
Eosinophils Absolute: 0 10*3/uL (ref 0.0–0.5)
Eosinophils Relative: 0 %
HCT: 40.5 % (ref 36.0–46.0)
Hemoglobin: 13.1 g/dL (ref 12.0–15.0)
Immature Granulocytes: 5 %
Lymphocytes Relative: 17 %
Lymphs Abs: 2.1 10*3/uL (ref 0.7–4.0)
MCH: 28.9 pg (ref 26.0–34.0)
MCHC: 32.3 g/dL (ref 30.0–36.0)
MCV: 89.2 fL (ref 80.0–100.0)
Monocytes Absolute: 0.8 10*3/uL (ref 0.1–1.0)
Monocytes Relative: 6 %
Neutro Abs: 8.9 10*3/uL — ABNORMAL HIGH (ref 1.7–7.7)
Neutrophils Relative %: 71 %
Platelets: 334 10*3/uL (ref 150–400)
RBC: 4.54 MIL/uL (ref 3.87–5.11)
RDW: 14.2 % (ref 11.5–15.5)
WBC: 12.5 10*3/uL — ABNORMAL HIGH (ref 4.0–10.5)
nRBC: 0.2 % (ref 0.0–0.2)

## 2023-12-08 LAB — MAGNESIUM: Magnesium: 2.3 mg/dL (ref 1.7–2.4)

## 2023-12-08 LAB — PHOSPHORUS: Phosphorus: 4.5 mg/dL (ref 2.5–4.6)

## 2023-12-08 MED ORDER — PREDNISONE 20 MG PO TABS
40.0000 mg | ORAL_TABLET | Freq: Every day | ORAL | Status: DC
  Administered 2023-12-09 – 2023-12-11 (×3): 40 mg via ORAL
  Filled 2023-12-08 (×3): qty 2

## 2023-12-08 NOTE — Progress Notes (Signed)
 PROGRESS NOTE    Megan Roach  VHQ:[id number] DOB: August 15, 1976 DOA: 12/03/2023 PCP: Mosetta Putt, MD  Chief Complaint  Patient presents with   Shortness of Breath    Brief Narrative:   Megan Roach is Megan Roach 48 yo female with PMH reactive airway disease, anxiety who presented with cough, wheezing, SOB.  She has been around sick family members prior to admission but states they tested negative on respiratory swabs. She was found to be positive for RSV on admission.  CXR negative for acute findings. She was started on steroids and breathing treatments and admitted for further monitoring.  Assessment & Plan:   Principal Problem:   Asthma exacerbation Active Problems:   RSV (respiratory syncytial virus infection)   Acute respiratory failure with hypoxia (HCC)   Depression   RSV (acute bronchiolitis due to respiratory syncytial virus)  Asthma exacerbation due to RSV infection - did not tolerate albuterol neb (sounds like coughing fits?) -- continues on RA, but increased WOB and audible wheezing from bedside - marginally improved today -- Takes Symbicort at home; albuterol inhaler as needed at home -- Denies much benefit from Singulair or Claritin in the past -- Solu-Medrol.  Xopenex and ipratropium. Budesonide, brovana. -- will plan for CT chest.  Low suspicion for superimposed bacterial pneumonia at this time, consider abx as indicated. -- will ask pulm for input given her persistent symptoms -> recommendingsteroid taper, symbicort on discharge, albuterol on discharge, needs outpatient pulm and ENT follow up - PPI -- alpha 1 anti trypsin wnl    RSV (respiratory syncytial virus infection) - See asthma exacerbation - Continue supportive care - Droplet precautions -Start on Tussionex and Delsym   Acute respiratory failure with hypoxia (HCC) - currently on RA, still with diffuse wheezing - expect her to improve gradually over next several days   Depression - Continue Lexapro  and Wellbutrin - Continue Klonopin      DVT prophylaxis: lovenox Code Status: full Family Communication: none Disposition:   Status is: Inpatient Remains inpatient appropriate because: need for inpatient care   Consultants:  none  Procedures:  none  Antimicrobials:  Anti-infectives (From admission, onward)    None       Subjective: Feels Megan Roach little better  Objective: Vitals:   12/08/23 0531 12/08/23 0746 12/08/23 0749 12/08/23 1138  BP: (!) 138/91     Pulse: 81     Resp: 20     Temp:      TempSrc:      SpO2: 96% 97% 97% 97%  Weight:      Height:        Intake/Output Summary (Last 24 hours) at 12/08/2023 1525 Last data filed at 12/08/2023 0900 Gross per 24 hour  Intake 240 ml  Output --  Net 240 ml   Filed Weights   12/03/23 1440  Weight: 67.1 kg    Examination:  General: No acute distress. Cardiovascular: RRR Lungs: audible wheezing from bedside, increased WOB Neurological: Alert and oriented 3. Moves all extremities 4 with equal strength. Cranial nerves II through XII grossly intact. Extremities: No clubbing or cyanosis. No edema.   Data Reviewed: I have personally reviewed following labs and imaging studies  CBC: Recent Labs  Lab 12/03/23 1449 12/04/23 0414 12/07/23 0422 12/08/23 0355  WBC 8.5 7.1 11.0* 12.5*  NEUTROABS  --   --   --  8.9*  HGB 13.3 12.9 13.0 13.1  HCT 39.1 39.1 39.8 40.5  MCV 85.4 87.7 88.8 89.2  PLT 299 318  331 334    Basic Metabolic Panel: Recent Labs  Lab 12/03/23 1449 12/03/23 1721 12/04/23 0414 12/07/23 0422 12/08/23 0355  NA 137  --  137 136 134*  K 3.3*  --  5.1 4.3 4.6  CL 108  --  110 103 99  CO2 18*  --  17* 25 24  GLUCOSE 113*  --  148* 127* 130*  BUN 14  --  18 25* 25*  CREATININE 0.91  --  0.75 1.00 0.86  CALCIUM 8.5*  --  8.6* 9.0 9.1  MG  --  3.2*  --  2.2 2.3  PHOS  --   --   --  3.6 4.5    GFR: Estimated Creatinine Clearance: 66 mL/min (by C-G formula based on SCr of 0.86  mg/dL).  Liver Function Tests: Recent Labs  Lab 12/04/23 1001 12/08/23 0355  AST 20 30  ALT 20 65*  ALKPHOS 56 57  BILITOT 0.8 0.7  PROT 7.2 6.9  ALBUMIN 3.7 3.6    CBG: No results for input(s): "GLUCAP" in the last 168 hours.   Recent Results (from the past 240 hours)  Resp panel by RT-PCR (RSV, Flu Megan Roach&B, Covid) Anterior Nasal Swab     Status: Abnormal   Collection Time: 12/03/23  2:58 PM   Specimen: Anterior Nasal Swab  Result Value Ref Range Status   SARS Coronavirus 2 by RT PCR NEGATIVE NEGATIVE Final    Comment: (NOTE) SARS-CoV-2 target nucleic acids are NOT DETECTED.  The SARS-CoV-2 RNA is generally detectable in upper respiratory specimens during the acute phase of infection. The lowest concentration of SARS-CoV-2 viral copies this assay can detect is 138 copies/mL. Megan Roach negative result does not preclude SARS-Cov-2 infection and should not be used as the sole basis for treatment or other patient management decisions. Megan Roach negative result may occur with  improper specimen collection/handling, submission of specimen other than nasopharyngeal swab, presence of viral mutation(s) within the areas targeted by this assay, and inadequate number of viral copies(<138 copies/mL). Megan Roach negative result must be combined with clinical observations, patient history, and epidemiological information. The expected result is Negative.  Fact Sheet for Patients:  BloggerCourse.com  Fact Sheet for Healthcare Providers:  SeriousBroker.it  This test is no t yet approved or cleared by the [place] FDA and  has been authorized for detection and/or diagnosis of SARS-CoV-2 by FDA under an Emergency Use Authorization (EUA). This EUA will remain  in effect (meaning this test can be used) for the duration of the COVID-19 declaration under Section 564(b)(1) of the Act, 21 U.S.C.section 360bbb-3(b)(1), unless the authorization is terminated  or  revoked sooner.       Influenza Megan Roach by PCR NEGATIVE NEGATIVE Final   Influenza B by PCR NEGATIVE NEGATIVE Final    Comment: (NOTE) The Xpert Xpress SARS-CoV-2/FLU/RSV plus assay is intended as an aid in the diagnosis of influenza from Nasopharyngeal swab specimens and should not be used as Megan Roach Dillen sole basis for treatment. Nasal washings and aspirates are unacceptable for Xpert Xpress SARS-CoV-2/FLU/RSV testing.  Fact Sheet for Patients: BloggerCourse.com  Fact Sheet for Healthcare Providers: SeriousBroker.it  This test is not yet approved or cleared by the [place] FDA and has been authorized for detection and/or diagnosis of SARS-CoV-2 by FDA under an Emergency Use Authorization (EUA). This EUA will remain in effect (meaning this test can be used) for the duration of the COVID-19 declaration under Section 564(b)(1) of the Act, 21 U.S.C. section 360bbb-3(b)(1), unless the authorization  is terminated or revoked.     Resp Syncytial Virus by PCR POSITIVE (Ruben Pyka) NEGATIVE Final    Comment: (NOTE) Fact Sheet for Patients: BloggerCourse.com  Fact Sheet for Healthcare Providers: SeriousBroker.it  This test is not yet approved or cleared by the [place] FDA and has been authorized for detection and/or diagnosis of SARS-CoV-2 by FDA under an Emergency Use Authorization (EUA). This EUA will remain in effect (meaning this test can be used) for the duration of the COVID-19 declaration under Section 564(b)(1) of the Act, 21 U.S.C. section 360bbb-3(b)(1), unless the authorization is terminated or revoked.  Performed at [facility], [address], [place], [place] [address]          Radiology Studies: CT CHEST WO CONTRAST Result Date: 12/07/2023 CLINICAL DATA:  Respiratory illness.  Shortness of breath. EXAM: CT CHEST WITHOUT CONTRAST TECHNIQUE: Multidetector  CT imaging of the chest was performed following the standard protocol without IV contrast. RADIATION DOSE REDUCTION: This exam was performed according to the departmental dose-optimization program which includes automated exposure control, adjustment of the mA and/or kV according to patient size and/or use of iterative reconstruction technique. COMPARISON:  Chest radiograph dated 12/03/2023. FINDINGS: Evaluation of this exam is limited in the absence of intravenous contrast as well as due to respiratory motion. Cardiovascular: There is no cardiomegaly or pericardial effusion. The thoracic aorta and the central pulmonary arteries appear unremarkable on this noncontrast CT. Mediastinum/Nodes: No hilar or mediastinal adenopathy. Evaluation however is limited in the absence of intravenous contrast and due to respiratory motion port the esophagus and the thyroid gland are grossly unremarkable no mediastinal fluid collection. Lungs/Pleura: No focal consolidation, pleural effusion, or pneumothorax. The central airways are patent. Upper Abdomen: No acute abnormality. Musculoskeletal: No acute osseous pathology. IMPRESSION: No acute intrathoracic pathology. Electronically Signed   By: Elgie Collard M.D.   On: 12/07/2023 16:30        Scheduled Meds:  arformoterol  15 mcg Nebulization BID   budesonide (PULMICORT) nebulizer solution  0.5 mg Nebulization BID   buPROPion  75 mg Oral Daily   chlorpheniramine-HYDROcodone  5 mL Oral BID   dextromethorphan  60 mg Oral QID   enoxaparin (LOVENOX) injection  40 mg Subcutaneous Q24H   escitalopram  15 mg Oral Q breakfast   guaiFENesin  1,200 mg Oral BID   ipratropium  0.5 mg Nebulization QID   levalbuterol  1.25 mg Nebulization QID   methylPREDNISolone (SOLU-MEDROL) injection  40 mg Intravenous BID   pantoprazole  40 mg Oral QHS   [START ON 12/09/2023] predniSONE  40 mg Oral Q breakfast   Continuous Infusions:   LOS: 5 days    Time spent: over 30  min    Lacretia Nicks, MD [facility]   To contact the attending provider between 7A-7P or the covering provider during after hours 7P-7A, please log into the web site [email or web address] and access using universal [place] password for that web site. If you do not have the password, please call the hospital operator.  12/08/2023, 3:25 PM

## 2023-12-09 DIAGNOSIS — J210 Acute bronchiolitis due to respiratory syncytial virus: Secondary | ICD-10-CM | POA: Diagnosis not present

## 2023-12-09 MED ORDER — FAMOTIDINE IN NACL 20-0.9 MG/50ML-% IV SOLN
20.0000 mg | INTRAVENOUS | Status: AC
  Administered 2023-12-09: 20 mg via INTRAVENOUS
  Filled 2023-12-09: qty 50

## 2023-12-09 MED ORDER — ALBUTEROL SULFATE (2.5 MG/3ML) 0.083% IN NEBU: 2.5000 mg | INHALATION_SOLUTION | RESPIRATORY_TRACT | Status: DC | PRN

## 2023-12-09 MED ORDER — DIPHENHYDRAMINE HCL 50 MG/ML IJ SOLN
INTRAMUSCULAR | Status: AC
  Filled 2023-12-09: qty 1

## 2023-12-09 MED ORDER — DIPHENHYDRAMINE HCL 50 MG/ML IJ SOLN
25.0000 mg | Freq: Once | INTRAMUSCULAR | Status: AC
  Administered 2023-12-09: 25 mg via INTRAVENOUS

## 2023-12-09 MED ORDER — METHYLPREDNISOLONE SODIUM SUCC 40 MG IJ SOLR
40.0000 mg | Freq: Once | INTRAMUSCULAR | Status: AC
  Administered 2023-12-09: 40 mg via INTRAVENOUS

## 2023-12-09 MED ORDER — ALBUTEROL SULFATE (2.5 MG/3ML) 0.083% IN NEBU
2.5000 mg | INHALATION_SOLUTION | RESPIRATORY_TRACT | Status: DC | PRN
Start: 2023-12-09 — End: 2023-12-09

## 2023-12-09 MED ORDER — IPRATROPIUM BROMIDE 0.02 % IN SOLN
0.5000 mg | Freq: Four times a day (QID) | RESPIRATORY_TRACT | Status: DC
  Administered 2023-12-10 – 2023-12-11 (×6): 0.5 mg via RESPIRATORY_TRACT
  Filled 2023-12-09 (×7): qty 2.5

## 2023-12-09 MED ORDER — IPRATROPIUM-ALBUTEROL 0.5-2.5 (3) MG/3ML IN SOLN
3.0000 mL | Freq: Four times a day (QID) | RESPIRATORY_TRACT | Status: DC
  Administered 2023-12-09 (×2): 3 mL via RESPIRATORY_TRACT
  Filled 2023-12-09 (×2): qty 3

## 2023-12-09 MED ORDER — LEVALBUTEROL HCL 0.63 MG/3ML IN NEBU
0.6300 mg | INHALATION_SOLUTION | Freq: Four times a day (QID) | RESPIRATORY_TRACT | Status: DC
  Administered 2023-12-10 (×2): 0.63 mg via RESPIRATORY_TRACT
  Filled 2023-12-09 (×2): qty 3

## 2023-12-09 MED ORDER — METHYLPREDNISOLONE SODIUM SUCC 40 MG IJ SOLR
INTRAMUSCULAR | Status: AC
  Filled 2023-12-09: qty 1

## 2023-12-09 NOTE — Plan of Care (Signed)
  Problem: Education: Goal: Knowledge of General Education information will improve Description: Including pain rating scale, medication(s)/side effects and non-pharmacologic comfort measures Outcome: Progressing   Problem: Health Behavior/Discharge Planning: Goal: Ability to manage health-related needs will improve Outcome: Progressing   Problem: Clinical Measurements: Goal: Ability to maintain clinical measurements within normal limits will improve Outcome: Progressing Goal: Will remain free from infection Outcome: Progressing Goal: Diagnostic test results will improve Outcome: Progressing Goal: Respiratory complications will improve Outcome: Progressing   Problem: Activity: Goal: Risk for activity intolerance will decrease Outcome: Progressing   Problem: Nutrition: Goal: Adequate nutrition will be maintained Outcome: Progressing   Problem: Coping: Goal: Level of anxiety will decrease Outcome: Progressing   Problem: Elimination: Goal: Will not experience complications related to bowel motility Outcome: Progressing Goal: Will not experience complications related to urinary retention Outcome: Progressing   Problem: Pain Managment: Goal: General experience of comfort will improve and/or be controlled Outcome: Progressing   Problem: Safety: Goal: Ability to remain free from injury will improve Outcome: Progressing   Problem: Skin Integrity: Goal: Risk for impaired skin integrity will decrease Outcome: Progressing

## 2023-12-09 NOTE — Progress Notes (Signed)
 PROGRESS NOTE    Megan Roach  RUE:[id number] DOB: Sep 01, 1976 DOA: 12/03/2023 PCP: Mosetta Putt, MD  Chief Complaint  Patient presents with   Shortness of Breath    Brief Narrative:   Megan Roach is Megan Roach 48 yo female with PMH reactive airway disease, anxiety who presented with cough, wheezing, SOB.  She has been around sick family members prior to admission but states they tested negative on respiratory swabs. She was found to be positive for RSV on admission.  CXR negative for acute findings. She was started on steroids and breathing treatments and admitted for further monitoring.  Assessment & Plan:   Principal Problem:   Asthma exacerbation Active Problems:   RSV (respiratory syncytial virus infection)   Acute respiratory failure with hypoxia (HCC)   Depression   RSV (acute bronchiolitis due to respiratory syncytial virus)  Asthma exacerbation due to RSV infection -- continues on RA, but increased WOB and audible wheezing from bedside  -- Takes Symbicort at home; albuterol inhaler as needed at home -- Denies much benefit from Singulair or Claritin in the past -- Solu-Medrol.  Albuterol, duonebs. Budesonide, brovana. -- will plan for CT chest.  Low suspicion for superimposed bacterial pneumonia at this time, consider abx as indicated. -- will ask pulm for input given her persistent symptoms -> recommendingsteroid taper, symbicort on discharge, albuterol on discharge, needs outpatient pulm and ENT follow up - PPI -- alpha 1 anti trypsin wnl    RSV (respiratory syncytial virus infection) - See asthma exacerbation - Continue supportive care - Droplet precautions -Start on Tussionex and Delsym   Acute respiratory failure with hypoxia (HCC) - currently on RA, still with diffuse wheezing - expect her to improve gradually over next several days   Depression - Continue Lexapro and Wellbutrin - Continue Klonopin      DVT prophylaxis: lovenox Code Status:  full Family Communication: none Disposition:   Status is: Inpatient Remains inpatient appropriate because: need for inpatient care   Consultants:  none  Procedures:  none  Antimicrobials:  Anti-infectives (From admission, onward)    None       Subjective: Continued sob  Objective: Vitals:   12/08/23 2208 12/09/23 0532 12/09/23 1118 12/09/23 1212  BP:  (!) 137/90  (!) 139/99  Pulse:  75 98 (!) 110  Resp:  16 (!) 25 18  Temp:  98 F (36.7 C)  98.3 F (36.8 C)  TempSrc:  Oral  Oral  SpO2: 94% 96% 94% 93%  Weight:      Height:       No intake or output data in the 24 hours ending 12/09/23 1428  Filed Weights   12/03/23 1440  Weight: 67.1 kg    Examination:  General: No acute distress. Cardiovascular: RRR Lungs: audible wheezing again - increased wob Neurological: Alert and oriented 3. Moves all extremities 4 with equal strength. Cranial nerves II through XII grossly intact. Extremities: No clubbing or cyanosis. No edema.   Data Reviewed: I have personally reviewed following labs and imaging studies  CBC: Recent Labs  Lab 12/03/23 1449 12/04/23 0414 12/07/23 0422 12/08/23 0355  WBC 8.5 7.1 11.0* 12.5*  NEUTROABS  --   --   --  8.9*  HGB 13.3 12.9 13.0 13.1  HCT 39.1 39.1 39.8 40.5  MCV 85.4 87.7 88.8 89.2  PLT 299 318 331 334    Basic Metabolic Panel: Recent Labs  Lab 12/03/23 1449 12/03/23 1721 12/04/23 0414 12/07/23 0422 12/08/23 0355  NA 137  --  137 136 134*  K 3.3*  --  5.1 4.3 4.6  CL 108  --  110 103 99  CO2 18*  --  17* 25 24  GLUCOSE 113*  --  148* 127* 130*  BUN 14  --  18 25* 25*  CREATININE 0.91  --  0.75 1.00 0.86  CALCIUM 8.5*  --  8.6* 9.0 9.1  MG  --  3.2*  --  2.2 2.3  PHOS  --   --   --  3.6 4.5    GFR: Estimated Creatinine Clearance: 66 mL/min (by C-G formula based on SCr of 0.86 mg/dL).  Liver Function Tests: Recent Labs  Lab 12/04/23 1001 12/08/23 0355  AST 20 30  ALT 20 65*  ALKPHOS 56 57  BILITOT  0.8 0.7  PROT 7.2 6.9  ALBUMIN 3.7 3.6    CBG: No results for input(s): "GLUCAP" in the last 168 hours.   Recent Results (from the past 240 hours)  Resp panel by RT-PCR (RSV, Flu Mandell Pangborn&B, Covid) Anterior Nasal Swab     Status: Abnormal   Collection Time: 12/03/23  2:58 PM   Specimen: Anterior Nasal Swab  Result Value Ref Range Status   SARS Coronavirus 2 by RT PCR NEGATIVE NEGATIVE Final    Comment: (NOTE) SARS-CoV-2 target nucleic acids are NOT DETECTED.  The SARS-CoV-2 RNA is generally detectable in upper respiratory specimens during the acute phase of infection. The lowest concentration of SARS-CoV-2 viral copies this assay can detect is 138 copies/mL. Ashely Joshua negative result does not preclude SARS-Cov-2 infection and should not be used as the sole basis for treatment or other patient management decisions. Jacquie Lukes negative result may occur with  improper specimen collection/handling, submission of specimen other than nasopharyngeal swab, presence of viral mutation(s) within the areas targeted by this assay, and inadequate number of viral copies(<138 copies/mL). Roarke Marciano negative result must be combined with clinical observations, patient history, and epidemiological information. The expected result is Negative.  Fact Sheet for Patients:  BloggerCourse.com  Fact Sheet for Healthcare Providers:  SeriousBroker.it  This test is no t yet approved or cleared by the [place] FDA and  has been authorized for detection and/or diagnosis of SARS-CoV-2 by FDA under an Emergency Use Authorization (EUA). This EUA will remain  in effect (meaning this test can be used) for the duration of the COVID-19 declaration under Section 564(b)(1) of the Act, 21 U.S.C.section 360bbb-3(b)(1), unless the authorization is terminated  or revoked sooner.       Influenza Worthy Boschert by PCR NEGATIVE NEGATIVE Final   Influenza B by PCR NEGATIVE NEGATIVE Final    Comment:  (NOTE) The Xpert Xpress SARS-CoV-2/FLU/RSV plus assay is intended as an aid in the diagnosis of influenza from Nasopharyngeal swab specimens and should not be used as Alyse Kathan sole basis for treatment. Nasal washings and aspirates are unacceptable for Xpert Xpress SARS-CoV-2/FLU/RSV testing.  Fact Sheet for Patients: BloggerCourse.com  Fact Sheet for Healthcare Providers: SeriousBroker.it  This test is not yet approved or cleared by the [place] FDA and has been authorized for detection and/or diagnosis of SARS-CoV-2 by FDA under an Emergency Use Authorization (EUA). This EUA will remain in effect (meaning this test can be used) for the duration of the COVID-19 declaration under Section 564(b)(1) of the Act, 21 U.S.C. section 360bbb-3(b)(1), unless the authorization is terminated or revoked.     Resp Syncytial Virus by PCR POSITIVE (Sera Hitsman) NEGATIVE Final    Comment: (NOTE) Fact Sheet for Patients: BloggerCourse.com  Fact Sheet for Healthcare Providers: SeriousBroker.it  This test is not yet approved or cleared by the [place] FDA and has been authorized for detection and/or diagnosis of SARS-CoV-2 by FDA under an Emergency Use Authorization (EUA). This EUA will remain in effect (meaning this test can be used) for the duration of the COVID-19 declaration under Section 564(b)(1) of the Act, 21 U.S.C. section 360bbb-3(b)(1), unless the authorization is terminated or revoked.  Performed at [facility], [address], [place], [place] [address]          Radiology Studies: CT CHEST WO CONTRAST Result Date: 12/07/2023 CLINICAL DATA:  Respiratory illness.  Shortness of breath. EXAM: CT CHEST WITHOUT CONTRAST TECHNIQUE: Multidetector CT imaging of the chest was performed following the standard protocol without IV contrast. RADIATION DOSE REDUCTION: This exam  was performed according to the departmental dose-optimization program which includes automated exposure control, adjustment of the mA and/or kV according to patient size and/or use of iterative reconstruction technique. COMPARISON:  Chest radiograph dated 12/03/2023. FINDINGS: Evaluation of this exam is limited in the absence of intravenous contrast as well as due to respiratory motion. Cardiovascular: There is no cardiomegaly or pericardial effusion. The thoracic aorta and the central pulmonary arteries appear unremarkable on this noncontrast CT. Mediastinum/Nodes: No hilar or mediastinal adenopathy. Evaluation however is limited in the absence of intravenous contrast and due to respiratory motion port the esophagus and the thyroid gland are grossly unremarkable no mediastinal fluid collection. Lungs/Pleura: No focal consolidation, pleural effusion, or pneumothorax. The central airways are patent. Upper Abdomen: No acute abnormality. Musculoskeletal: No acute osseous pathology. IMPRESSION: No acute intrathoracic pathology. Electronically Signed   By: Elgie Collard M.D.   On: 12/07/2023 16:30        Scheduled Meds:  arformoterol  15 mcg Nebulization BID   budesonide (PULMICORT) nebulizer solution  0.5 mg Nebulization BID   buPROPion  75 mg Oral Daily   chlorpheniramine-HYDROcodone  5 mL Oral BID   dextromethorphan  60 mg Oral QID   enoxaparin (LOVENOX) injection  40 mg Subcutaneous Q24H   escitalopram  15 mg Oral Q breakfast   guaiFENesin  1,200 mg Oral BID   ipratropium-albuterol  3 mL Nebulization Q6H   pantoprazole  40 mg Oral QHS   predniSONE  40 mg Oral Q breakfast   Continuous Infusions:   LOS: 6 days    Time spent: over 30 min    Lacretia Nicks, MD [facility]   To contact the attending provider between 7A-7P or the covering provider during after hours 7P-7A, please log into the web site [email or web address] and access using universal [place] password for that web  site. If you do not have the password, please call the hospital operator.  12/09/2023, 2:28 PM

## 2023-12-09 NOTE — Progress Notes (Signed)
Bipap not needed at this time. °

## 2023-12-09 NOTE — Progress Notes (Signed)
 Patient has allergy to Albuterol. RT changed order back to Xop and atrovent.

## 2023-12-09 NOTE — Progress Notes (Signed)
    Patient Name: Megan Roach           DOB: [birth year]  MRN: [id number]      Admission Date: 12/03/2023  Attending Provider: Zigmund Daniel., *  Primary Diagnosis: Asthma exacerbation   Level of care: Progressive    CROSS COVER NOTE   Date of Service   12/09/2023   Megan Roach, 48 y.o. female, was admitted on 12/03/2023 for Asthma exacerbation.    HPI/Events of Note   Notified by RN of albuterol reaction- pt anxious, tachycardic (140's), palpitations, wheezing, and worsening SOB. Family at bedside reports she is sensitive to albuterol and duo-neb nebulizer. However takes albuterol inhaler at home without complications?  Request has been made to change neb treatments to Xopenex.  Bedside Assessment:  Patient is awake, A/O.  Endorses feeling anxious and short of breath.  Vital signs collected again, HR has come down to 100s. Education offered that albuterol can sometimes cause nervousness, tachycardia. No itching, hives, rash, angioedema noted.  Voice clear and has strong cough.  Respiratory: Diffuse wheezing. Normal effort. No accessory muscle use.   Cardiovascular: Regular rhythm. Tachycardic. No extremity edema.    Interventions/ Plan   IV Benadryl IV solumedrol Xopenex neb        Anthoney Harada, DNP, [employer] [facility] [place]

## 2023-12-10 ENCOUNTER — Inpatient Hospital Stay (HOSPITAL_COMMUNITY): Payer: BC Managed Care – PPO

## 2023-12-10 DIAGNOSIS — J210 Acute bronchiolitis due to respiratory syncytial virus: Secondary | ICD-10-CM | POA: Diagnosis not present

## 2023-12-10 LAB — D-DIMER, QUANTITATIVE: D-Dimer, Quant: <0.27 ug{FEU}/mL (ref 0.00–0.50)

## 2023-12-10 LAB — TROPONIN I (HIGH SENSITIVITY)
Troponin I (High Sensitivity): <2 ng/L (ref <18)
Troponin I (High Sensitivity): 3 ng/L (ref <18)

## 2023-12-10 MED ORDER — LEVALBUTEROL HCL 1.25 MG/0.5ML IN NEBU: 1.2500 mg | INHALATION_SOLUTION | Freq: Four times a day (QID) | RESPIRATORY_TRACT | Status: DC | PRN

## 2023-12-10 MED ORDER — FLUTICASONE PROPIONATE 50 MCG/ACT NA SUSP
2.0000 | Freq: Every day | NASAL | Status: DC
  Filled 2023-12-10: qty 16

## 2023-12-10 MED ORDER — LEVALBUTEROL HCL 1.25 MG/0.5ML IN NEBU
1.2500 mg | INHALATION_SOLUTION | Freq: Four times a day (QID) | RESPIRATORY_TRACT | Status: DC
  Administered 2023-12-10 – 2023-12-11 (×4): 1.25 mg via RESPIRATORY_TRACT
  Filled 2023-12-10 (×5): qty 0.5

## 2023-12-10 MED ORDER — LEVALBUTEROL HCL 1.25 MG/0.5ML IN NEBU
INHALATION_SOLUTION | RESPIRATORY_TRACT | Status: AC
  Administered 2023-12-10: 1.25 mg
  Filled 2023-12-10: qty 0.5

## 2023-12-10 MED ORDER — CYCLOBENZAPRINE HCL 5 MG PO TABS: 5.0000 mg | ORAL_TABLET | Freq: Once | ORAL | Status: DC | PRN

## 2023-12-10 MED ORDER — BUSPIRONE HCL 5 MG PO TABS
5.0000 mg | ORAL_TABLET | Freq: Three times a day (TID) | ORAL | Status: DC
  Filled 2023-12-10: qty 1

## 2023-12-10 NOTE — Plan of Care (Signed)
  Problem: Education: Goal: Knowledge of General Education information will improve Description: Including pain rating scale, medication(s)/side effects and non-pharmacologic comfort measures Outcome: Progressing   Problem: Health Behavior/Discharge Planning: Goal: Ability to manage health-related needs will improve Outcome: Progressing   Problem: Clinical Measurements: Goal: Ability to maintain clinical measurements within normal limits will improve Outcome: Progressing Goal: Will remain free from infection Outcome: Progressing Goal: Diagnostic test results will improve Outcome: Progressing Goal: Respiratory complications will improve Outcome: Progressing   Problem: Activity: Goal: Risk for activity intolerance will decrease Outcome: Progressing   Problem: Nutrition: Goal: Adequate nutrition will be maintained Outcome: Progressing   Problem: Coping: Goal: Level of anxiety will decrease Outcome: Progressing   Problem: Elimination: Goal: Will not experience complications related to bowel motility Outcome: Progressing Goal: Will not experience complications related to urinary retention Outcome: Progressing   Problem: Pain Managment: Goal: General experience of comfort will improve and/or be controlled Outcome: Progressing   Problem: Safety: Goal: Ability to remain free from injury will improve Outcome: Progressing   Problem: Skin Integrity: Goal: Risk for impaired skin integrity will decrease Outcome: Progressing

## 2023-12-10 NOTE — Progress Notes (Signed)
 Rapid Response Event Note   Reason for Call : possible albuterol reaction, pt SOB after receiving medication    Initial Focused Assessment: Arrived to find pt very anxious and sob.  Oxygen placed on pt.  Lung sounds expiratory wheezes in all fields.  HR elevated 120's and regular.  Skin intact no hives, rash or itching noted.  Pt is A/O x 4.  Pt husband at bedside and explains this has happened before in the earlier part of this admission. Pt denies any tightness in her throat.     Interventions: [facility], NP at bedside see new orders received and initiated.  VS see flow sheet.     Plan of Care: Pt will remain in current location.  States she feels much better at this time.    Event Summary:   MD Notified: yes Call Time:2100 Arrival Time:2102 End Time:2120  Conley Rolls, RN

## 2023-12-10 NOTE — Progress Notes (Signed)
 Called to room by Respiratory therapist at 2115 due to patient having adverse reaction to albuterol nebulizer.  Entered room with SWOT nurse to find patient alert and acutely anxious and tachypneic.  VS showed of 140s systolic, pulse also in 140s and regular, afebrile and respirations about 30.  Rapid Response and hospitalist paged.   Family reports that patient had similar reaction in emergency room to albuterol and duoneb treatments.  However, patient uses albuterol rescue inhaler at home and has had no such reaction.   Patient given IV morphine per already existing order for shortness of breath.   Hospitalist also ordered IV diphenhydramine, IV solumedrol, and IV famotidine.   Respiratory rate and heart rate improving before interventions given.  Heart rate back to normal range for patient by 2150.  Continued to monitor HR and respirations overnight with no return of symptoms.  Allergy to Albuterol added to patient record.

## 2023-12-10 NOTE — Progress Notes (Addendum)
 PROGRESS NOTE    [name]  MVH:[id number] DOB: 1976/09/02 DOA: 12/03/2023 PCP: Mosetta Putt, MD  Chief Complaint  Patient presents with   Shortness of Breath    Brief Narrative:   Ms. Megan Roach is Megan Roach 48 yo female with PMH reactive airway disease, anxiety who presented with cough, wheezing, SOB.  She has been around sick family members prior to admission but states they tested negative on respiratory swabs. She was found to be positive for RSV on admission.  CXR negative for acute findings. She was started on steroids and breathing treatments and admitted for further monitoring.  Assessment & Plan:   Principal Problem:   Asthma exacerbation Active Problems:   RSV (respiratory syncytial virus infection)   Acute respiratory failure with hypoxia (HCC)   Depression   RSV (acute bronchiolitis due to respiratory syncytial virus)  Chest and Back Pain After I saw her, husband called me to bedside c/o significant CP (also back pain) - she described as "pain all over" seen writhing in bed, coughing unable to stay still.  Awaiting vitals.  Appears uncomfortable on exam, writhing in bed - exam stable with diffuse wheezing, transmitted upper airway sounds.  No appreciated crepitus of anterior chest wall, no notable abnormality to back.  Give morphine.  RT for breathing treatment.  Stat CXR.  EKG once she's able to lie still.  Will follow - suspect this is related to her respiratory illness.  Additional workup as indicated.  Asthma exacerbation due to RSV infection -- continues on RA, but increased WOB and audible wheezing from bedside  -- Takes Symbicort at home; albuterol inhaler as needed at home -- Denies much benefit from Singulair or Claritin in the past -- Solu-Medrol.  Ipratropium, xopenex. Budesonide, brovana. -- CT chest 2/20 without acute abnormality. -- will ask pulm for input given her persistent symptoms -> recommendingsteroid taper, symbicort on discharge, albuterol on  discharge, needs outpatient pulm and ENT follow up - PPI -- alpha 1 anti trypsin wnl    RSV (respiratory syncytial virus infection) - See asthma exacerbation - Continue supportive care - Droplet precautions -Start on Tussionex and Delsym   Acute respiratory failure with hypoxia (HCC) - currently on 2 L [place], still with diffuse wheezing - expect her to improve gradually over next several days   Depression - Continue Lexapro and Wellbutrin - Continue Klonopin      DVT prophylaxis: lovenox Code Status: full Family Communication: none Disposition:   Status is: Inpatient Remains inpatient appropriate because: need for inpatient care   Consultants:  none  Procedures:  none  Antimicrobials:  Anti-infectives (From admission, onward)    None       Subjective: Continued sob  Objective: Vitals:   12/10/23 0236 12/10/23 0527 12/10/23 0953 12/10/23 0957  BP:  135/87    Pulse:  76    Resp:  16    Temp:  97.6 F (36.4 C)    TempSrc:  Oral    SpO2: (!) 88% 94% 94% 94%  Weight:      Height:        Intake/Output Summary (Last 24 hours) at 12/10/2023 1214 Last data filed at 12/09/2023 1500 Gross per 24 hour  Intake 360 ml  Output --  Net 360 ml    Filed Weights   12/03/23 1440  Weight: 67.1 kg    Examination:  General: No acute distress. Cardiovascular: RRR Lungs: audible wheezing again - increased wob Neurological: Alert and oriented 3. Moves all extremities 4 with  equal strength. Cranial nerves II through XII grossly intact. Extremities: No clubbing or cyanosis. No edema.   Data Reviewed: I have personally reviewed following labs and imaging studies  CBC: Recent Labs  Lab 12/03/23 1449 12/04/23 0414 12/07/23 0422 12/08/23 0355  WBC 8.5 7.1 11.0* 12.5*  NEUTROABS  --   --   --  8.9*  HGB 13.3 12.9 13.0 13.1  HCT 39.1 39.1 39.8 40.5  MCV 85.4 87.7 88.8 89.2  PLT 299 318 331 334    Basic Metabolic Panel: Recent Labs  Lab 12/03/23 1449  12/03/23 1721 12/04/23 0414 12/07/23 0422 12/08/23 0355  NA 137  --  137 136 134*  K 3.3*  --  5.1 4.3 4.6  CL 108  --  110 103 99  CO2 18*  --  17* 25 24  GLUCOSE 113*  --  148* 127* 130*  BUN 14  --  18 25* 25*  CREATININE 0.91  --  0.75 1.00 0.86  CALCIUM 8.5*  --  8.6* 9.0 9.1  MG  --  3.2*  --  2.2 2.3  PHOS  --   --   --  3.6 4.5    GFR: Estimated Creatinine Clearance: 66 mL/min (by C-G formula based on SCr of 0.86 mg/dL).  Liver Function Tests: Recent Labs  Lab 12/04/23 1001 12/08/23 0355  AST 20 30  ALT 20 65*  ALKPHOS 56 57  BILITOT 0.8 0.7  PROT 7.2 6.9  ALBUMIN 3.7 3.6    CBG: No results for input(s): "GLUCAP" in the last 168 hours.   Recent Results (from the past 240 hours)  Resp panel by RT-PCR (RSV, Flu Dandrea Widdowson&B, Covid) Anterior Nasal Swab     Status: Abnormal   Collection Time: 12/03/23  2:58 PM   Specimen: Anterior Nasal Swab  Result Value Ref Range Status   SARS Coronavirus 2 by RT PCR NEGATIVE NEGATIVE Final    Comment: (NOTE) SARS-CoV-2 target nucleic acids are NOT DETECTED.  The SARS-CoV-2 RNA is generally detectable in upper respiratory specimens during the acute phase of infection. The lowest concentration of SARS-CoV-2 viral copies this assay can detect is 138 copies/mL. Miken Stecher negative result does not preclude SARS-Cov-2 infection and should not be used as the sole basis for treatment or other patient management decisions. Charlise Giovanetti negative result may occur with  improper specimen collection/handling, submission of specimen other than nasopharyngeal swab, presence of viral mutation(s) within the areas targeted by this assay, and inadequate number of viral copies(<138 copies/mL). Deetya Drouillard negative result must be combined with clinical observations, patient history, and epidemiological information. The expected result is Negative.  Fact Sheet for Patients:  BloggerCourse.com  Fact Sheet for Healthcare Providers:   SeriousBroker.it  This test is no t yet approved or cleared by the [place] FDA and  has been authorized for detection and/or diagnosis of SARS-CoV-2 by FDA under an Emergency Use Authorization (EUA). This EUA will remain  in effect (meaning this test can be used) for the duration of the COVID-19 declaration under Section 564(b)(1) of the Act, 21 U.S.C.section 360bbb-3(b)(1), unless the authorization is terminated  or revoked sooner.       Influenza Macarius Ruark by PCR NEGATIVE NEGATIVE Final   Influenza B by PCR NEGATIVE NEGATIVE Final    Comment: (NOTE) The Xpert Xpress SARS-CoV-2/FLU/RSV plus assay is intended as an aid in the diagnosis of influenza from Nasopharyngeal swab specimens and should not be used as Veleria Barnhardt sole basis for treatment. Nasal washings and aspirates are  unacceptable for Xpert Xpress SARS-CoV-2/FLU/RSV testing.  Fact Sheet for Patients: BloggerCourse.com  Fact Sheet for Healthcare Providers: SeriousBroker.it  This test is not yet approved or cleared by the [place] FDA and has been authorized for detection and/or diagnosis of SARS-CoV-2 by FDA under an Emergency Use Authorization (EUA). This EUA will remain in effect (meaning this test can be used) for the duration of the COVID-19 declaration under Section 564(b)(1) of the Act, 21 U.S.C. section 360bbb-3(b)(1), unless the authorization is terminated or revoked.     Resp Syncytial Virus by PCR POSITIVE (Scarleth Brame) NEGATIVE Final    Comment: (NOTE) Fact Sheet for Patients: BloggerCourse.com  Fact Sheet for Healthcare Providers: SeriousBroker.it  This test is not yet approved or cleared by the [place] FDA and has been authorized for detection and/or diagnosis of SARS-CoV-2 by FDA under an Emergency Use Authorization (EUA). This EUA will remain in effect (meaning this test can be used)  for the duration of the COVID-19 declaration under Section 564(b)(1) of the Act, 21 U.S.C. section 360bbb-3(b)(1), unless the authorization is terminated or revoked.  Performed at [facility], [address], [place], [place] [address]          Radiology Studies: No results found.       Scheduled Meds:  arformoterol  15 mcg Nebulization BID   budesonide (PULMICORT) nebulizer solution  0.5 mg Nebulization BID   buPROPion  75 mg Oral Daily   chlorpheniramine-HYDROcodone  5 mL Oral BID   dextromethorphan  60 mg Oral QID   enoxaparin (LOVENOX) injection  40 mg Subcutaneous Q24H   escitalopram  15 mg Oral Q breakfast   fluticasone  2 spray Each Nare QHS   guaiFENesin  1,200 mg Oral BID   ipratropium  0.5 mg Nebulization Q6H   levalbuterol  0.63 mg Nebulization Q6H   pantoprazole  40 mg Oral QHS   predniSONE  40 mg Oral Q breakfast   Continuous Infusions:   LOS: 7 days    Time spent: 40 min critical care time with respiratory failure with increased WOB, wheezing, as well as clinical worsening today with diffuse pain of undetermined etiology    Lacretia Nicks, MD [facility]   To contact the attending provider between 7A-7P or the covering provider during after hours 7P-7A, please log into the web site [email or web address] and access using universal [place] password for that web site. If you do not have the password, please call the hospital operator.  12/10/2023, 12:14 PM

## 2023-12-11 ENCOUNTER — Other Ambulatory Visit (HOSPITAL_COMMUNITY): Payer: Self-pay

## 2023-12-11 DIAGNOSIS — J210 Acute bronchiolitis due to respiratory syncytial virus: Secondary | ICD-10-CM | POA: Diagnosis not present

## 2023-12-11 LAB — CBC WITH DIFFERENTIAL/PLATELET
Abs Immature Granulocytes: 0.8 10*3/uL — ABNORMAL HIGH (ref 0.00–0.07)
Basophils Absolute: 0.1 10*3/uL (ref 0.0–0.1)
Basophils Relative: 1 %
Eosinophils Absolute: 0 10*3/uL (ref 0.0–0.5)
Eosinophils Relative: 0 %
HCT: 38.9 % (ref 36.0–46.0)
Hemoglobin: 12.6 g/dL (ref 12.0–15.0)
Immature Granulocytes: 6 %
Lymphocytes Relative: 25 %
Lymphs Abs: 3.1 10*3/uL (ref 0.7–4.0)
MCH: 29 pg (ref 26.0–34.0)
MCHC: 32.4 g/dL (ref 30.0–36.0)
MCV: 89.4 fL (ref 80.0–100.0)
Monocytes Absolute: 1 10*3/uL (ref 0.1–1.0)
Monocytes Relative: 8 %
Neutro Abs: 7.5 10*3/uL (ref 1.7–7.7)
Neutrophils Relative %: 60 %
Platelets: 328 10*3/uL (ref 150–400)
RBC: 4.35 MIL/uL (ref 3.87–5.11)
RDW: 14.6 % (ref 11.5–15.5)
WBC: 12.4 10*3/uL — ABNORMAL HIGH (ref 4.0–10.5)
nRBC: 0 % (ref 0.0–0.2)

## 2023-12-11 LAB — COMPREHENSIVE METABOLIC PANEL
ALT: 41 U/L (ref 0–44)
AST: 14 U/L — ABNORMAL LOW (ref 15–41)
Albumin: 3 g/dL — ABNORMAL LOW (ref 3.5–5.0)
Alkaline Phosphatase: 49 U/L (ref 38–126)
Anion gap: 9 (ref 5–15)
BUN: 29 mg/dL — ABNORMAL HIGH (ref 6–20)
CO2: 24 mmol/L (ref 22–32)
Calcium: 8.5 mg/dL — ABNORMAL LOW (ref 8.9–10.3)
Chloride: 104 mmol/L (ref 98–111)
Creatinine, Ser: 1.09 mg/dL — ABNORMAL HIGH (ref 0.44–1.00)
GFR, Estimated: >60 mL/min (ref >60)
Glucose, Bld: 115 mg/dL — ABNORMAL HIGH (ref 70–99)
Potassium: 3.6 mmol/L (ref 3.5–5.1)
Sodium: 137 mmol/L (ref 135–145)
Total Bilirubin: 0.5 mg/dL (ref 0.0–1.2)
Total Protein: 5.7 g/dL — ABNORMAL LOW (ref 6.5–8.1)

## 2023-12-11 LAB — PHOSPHORUS: Phosphorus: 4.4 mg/dL (ref 2.5–4.6)

## 2023-12-11 LAB — MAGNESIUM: Magnesium: 2.1 mg/dL (ref 1.7–2.4)

## 2023-12-11 MED ORDER — METHOCARBAMOL 500 MG PO TABS
500.0000 mg | ORAL_TABLET | Freq: Four times a day (QID) | ORAL | 0 refills | Status: DC | PRN
  Filled 2023-12-11: qty 30, 8d supply, fill #0

## 2023-12-11 MED ORDER — DICLOFENAC SODIUM 1 % EX GEL
2.0000 g | Freq: Four times a day (QID) | CUTANEOUS | 0 refills | Status: DC
  Filled 2023-12-11: qty 100, 30d supply, fill #0

## 2023-12-11 MED ORDER — POTASSIUM CHLORIDE CRYS ER 20 MEQ PO TBCR: 40.0000 meq | EXTENDED_RELEASE_TABLET | Freq: Once | ORAL | Status: DC

## 2023-12-11 MED ORDER — BUDESONIDE-FORMOTEROL FUMARATE 160-4.5 MCG/ACT IN AERO
2.0000 | INHALATION_SPRAY | Freq: Two times a day (BID) | RESPIRATORY_TRACT | 2 refills | Status: AC
  Filled 2023-12-11: qty 10.2, 30d supply, fill #0

## 2023-12-11 MED ORDER — PREDNISONE 10 MG PO TABS
ORAL_TABLET | ORAL | 0 refills | Status: AC
  Filled 2023-12-11: qty 35, 15d supply, fill #0

## 2023-12-11 MED ORDER — ALBUTEROL SULFATE HFA 108 (90 BASE) MCG/ACT IN AERS
2.0000 | INHALATION_SPRAY | RESPIRATORY_TRACT | 2 refills | Status: DC | PRN
  Filled 2023-12-11: qty 6.7, 30d supply, fill #0

## 2023-12-11 MED ORDER — LEVALBUTEROL HCL 1.25 MG/0.5ML IN NEBU
1.2500 mg | INHALATION_SOLUTION | RESPIRATORY_TRACT | 1 refills | Status: DC | PRN
  Filled 2023-12-11: qty 30, 5d supply, fill #0

## 2023-12-11 MED ORDER — OXYCODONE HCL 5 MG PO TABS
5.0000 mg | ORAL_TABLET | Freq: Four times a day (QID) | ORAL | 0 refills | Status: AC | PRN
  Filled 2023-12-11: qty 12, 3d supply, fill #0

## 2023-12-11 MED ORDER — LEVALBUTEROL HCL 1.25 MG/0.5ML IN NEBU: 1.2500 mg | INHALATION_SOLUTION | RESPIRATORY_TRACT | 1 refills | Status: DC | PRN

## 2023-12-11 MED ORDER — DICLOFENAC SODIUM 1 % EX GEL
2.0000 g | Freq: Four times a day (QID) | CUTANEOUS | Status: DC
  Filled 2023-12-11: qty 100

## 2023-12-11 MED ORDER — METHOCARBAMOL 500 MG PO TABS: 500.0000 mg | ORAL_TABLET | Freq: Four times a day (QID) | ORAL | Status: DC

## 2023-12-11 NOTE — Progress Notes (Signed)
   12/11/23 1238  TOC Brief Assessment  Insurance and Status Reviewed  Patient has primary care physician Yes  Home environment has been reviewed home with spouse  Prior level of function: independent  Prior/Current Home Services No current home services  Social Drivers of Health Review SDOH reviewed no interventions necessary  Readmission risk has been reviewed Yes  Transition of care needs no transition of care needs at this time

## 2023-12-11 NOTE — TOC Transition Note (Signed)
 Transition of Care [facility]) - Discharge Note   Patient Details  Name: Megan Roach MRN: [id number] Date of Birth: 04-04-1976  Transition of Care [facility]) CM/SW Contact:  Amada Jupiter, LCSW Phone Number: 12/11/2023, 4:12 PM   Clinical Narrative:     Alerted by MD that pt will need home nebulizer machine for home.  Met with pt and spouse who are agreeable and no agency preference.  Order placed with [employer] [facility] for delivery of machine to room this afternoon.  No further TOC needs.  Final next level of care: Home/Self Care Barriers to Discharge: Barriers Resolved   Patient Goals and CMS Choice            Discharge Placement                       Discharge Plan and Services Additional resources added to the After Visit Summary for                  DME Arranged: Nebulizer machine DME Agency: [employer] Date DME Agency Contacted: 12/11/23 Time DME Agency Contacted: [phone] Representative spoke with at DME Agency: Marthann Schiller            Social Drivers of Health (SDOH) Interventions SDOH Screenings   Food Insecurity: No Food Insecurity (12/03/2023)  Housing: Low Risk  (12/03/2023)  Transportation Needs: No Transportation Needs (12/03/2023)  Utilities: Not At Risk (12/03/2023)  Depression (PHQ2-9): Medium Risk (07/18/2023)  Physical Activity: Inactive (12/07/2020)   Received from [facility], [facility]  Social Connections: Moderately Integrated (12/03/2023)  Stress: No Stress Concern Present (12/07/2020)   Received from [facility], [facility]  Tobacco Use: Low Risk  (12/04/2023)     Readmission Risk Interventions    12/11/2023   12:37 PM  Readmission Risk Prevention Plan  Transportation Screening Complete  PCP or Specialist Appt within 5-7 Days Complete  Home Care Screening Complete  Medication Review (RN CM) Complete

## 2023-12-11 NOTE — Plan of Care (Signed)

## 2023-12-11 NOTE — Discharge Summary (Signed)
 Physician Discharge Summary  Megan Roach JXB:[id number] DOB: Feb 13, 1976 DOA: 12/03/2023  PCP: Megan Putt, MD  Admit date: 12/03/2023 Discharge date: 12/11/2023  Time spent: 40 minutes  Recommendations for Outpatient Follow-up:  Follow outpatient CBC/CMP  Needs pulmonary follow up outpatient Needs ENT follow up outpatient Follow muscle spasms - consider additional w/u if persistent Consider high res CT with pulm    Discharge Diagnoses:  Principal Problem:   Asthma exacerbation Active Problems:   RSV (respiratory syncytial virus infection)   Acute respiratory failure with hypoxia (HCC)   Depression   RSV (acute bronchiolitis due to respiratory syncytial virus)   Discharge Condition: stable  Diet recommendation: heart healthy  Filed Weights   12/03/23 1440  Weight: 67.1 kg    History of present illness:   [name] is Megan Roach 48 yo female with PMH reactive airway disease, anxiety who presented with cough, wheezing, SOB.  She has been around sick family members prior to admission but states they tested negative on respiratory swabs.  She was found to be positive for RSV on admission.  CXR negative for acute findings.  She was started on steroids and breathing treatments and admitted for further monitoring.  She's had extended course and very slow improvement.  Pulmonary was consulted and recommended steroid taper, outpatient pulm and ENT follow up.  She's gradually improved, stable for discharge today.  See below for additional details  Hospital Course:  Assessment and Plan:  Asthma exacerbation due to RSV infection -- exam improved, less wheezing -> remains on RA -- will refill albuterol inhaler and symbicort.  Will get nebulizer and xopenex if she prefers to albuterol inhaler. -- discharge with steroid taper.  -- CT chest 2/20 without acute abnormality. --pulm  -> recommending steroid taper, symbicort on discharge, albuterol on discharge, needs outpatient pulm  and ENT follow up (to evaluate for vocal cord dysfunction) - PPI - needs high resoltion CT scan in future -- alpha 1 anti trypsin wnl    RSV (respiratory syncytial virus infection) - See asthma exacerbation - Continue supportive care - Droplet precautions -Start on Tussionex and Delsym   Chest and Back Pain Costochondritis  Muscle Spasms She has reproducible TTP at sternum - episodes that are self limited, writhing in bed, severe chest/back pain - I suspect these are muscle spasms ?  Last episode was last night.  Will give muscle relaxer at discharge.  Trial voltaren to chest and back as needed.  Negative troponins, negative d dimer.  Repeat CXR yesterday unrevealing.  Acute respiratory failure with hypoxia (HCC) - currently on 2 L [place], still with diffuse wheezing - expect her to improve gradually over next several days   Depression - Continue Lexapro and Wellbutrin - Continue Klonopin    Procedures: none   Consultations: pulmonology  Discharge Exam: Vitals:   12/11/23 0934 12/11/23 1148  BP:  118/73  Pulse:  95  Resp:  16  Temp:  97.9 F (36.6 C)  SpO2: 97% 95%   Eager to discharge  General: No acute distress. Cardiovascular: RRR Lungs: no audible wheezing today from bedside - on exam, some faint wheezing, but overall less increased WOB - looks more comfortable Abdomen: Soft, nontender, nondistended  Neurological: Alert and oriented 3. Moves all extremities 4 with equal strength. Cranial nerves II through XII grossly intact. Extremities: No clubbing or cyanosis. No edema.   Discharge Instructions   Discharge Instructions     Ambulatory referral to ENT   Complete by: As directed  Call MD for:  difficulty breathing, headache or visual disturbances   Complete by: As directed    Call MD for:  extreme fatigue   Complete by: As directed    Call MD for:  hives   Complete by: As directed    Call MD for:  persistant dizziness or light-headedness   Complete by:  As directed    Call MD for:  persistant nausea and vomiting   Complete by: As directed    Call MD for:  redness, tenderness, or signs of infection (pain, swelling, redness, odor or green/yellow discharge around incision site)   Complete by: As directed    Call MD for:  severe uncontrolled pain   Complete by: As directed    Call MD for:  temperature >100.4   Complete by: As directed    Diet - low sodium heart healthy   Complete by: As directed    Discharge instructions   Complete by: As directed    You were seen for an RSV infection which caused an asthma exacerbation.   You've improved very gradually, which I see at times with RSV infection.  I'd expect continued gradual improvement with supportive care at home.  We'll send you with Megan Roach steroid taper.  I'll refill your symbicort and albuterol inhalers.  I'll get you Megan Roach nebulizer as well and you can use xopenex in place of albuterol if this helps more.  You'll need to follow up with pulmonology as an outpatient.  They'll consider additional workup.  I've also placed an ENT referral.  Please follow this up with your PCP to ensure you get the follow up you need.  You had Megan Roach few episodes of what I think may have been muscle spasms.  We'll send you home with robaxin (Megan Roach muscle relaxer) to use as needed.  You have tenderness of your chest, this maybe costochondritis.  The steroids should help, but you can also use voltaren gel as needed.    Use protonix daily.   Return for new, recurrent, or worsening symptoms.  Please ask your PCP to request records from this hospitalization so they know what was done and what the next steps will be.   Increase activity slowly   Complete by: As directed       Allergies as of 12/11/2023       Reactions   Albuterol Shortness Of Breath, Palpitations   Also sensitive to duo-neb. Ok to use Xopenex    Sulfa Antibiotics Swelling        Medication List     STOP taking these medications    azithromycin 250  MG tablet Commonly known as: ZITHROMAX       TAKE these medications    albuterol 108 (90 Base) MCG/ACT inhaler Commonly known as: VENTOLIN HFA Inhale 2 puffs into the lungs every 4 (four) hours as needed for wheezing or shortness of breath. What changed: reasons to take this   budesonide-formoterol 160-4.5 MCG/ACT inhaler Commonly known as: SYMBICORT Inhale 2 puffs into the lungs 2 (two) times daily. What changed: when to take this   buPROPion 75 MG tablet Commonly known as: WELLBUTRIN Take 1 tablet (75 mg total) by mouth in the morning for 15 days. What changed: Another medication with the same name was removed. Continue taking this medication, and follow the directions you see here.   clonazePAM 0.5 MG tablet Commonly known as: KlonoPIN Take 1 tablet (0.5 mg total) by mouth daily as needed for anxiety. Should last 30 days ,  use only for emergencies   diclofenac Sodium 1 % Gel Commonly known as: VOLTAREN Apply 2 g topically 4 (four) times daily. Apply to chest pain and back pain as needed.   escitalopram 10 MG tablet Commonly known as: LEXAPRO Take 1.5 tablets (15 mg total) by mouth daily with breakfast.   hyoscyamine 0.125 MG SL tablet Commonly known as: LEVSIN SL Place 0.125 mg under the tongue every 6 (six) hours as needed for cramping.   levalbuterol 1.25 MG/0.5ML nebulizer solution Commonly known as: XOPENEX Take 1.25 mg by nebulization every 4 (four) hours as needed for wheezing or shortness of breath. (When you're unable to use the albuterol inhaler)   methocarbamol 500 MG tablet Commonly known as: ROBAXIN Take 1 tablet (500 mg total) by mouth every 6 (six) hours as needed for muscle spasms.   Minivelle 0.05 MG/24HR patch Generic drug: estradiol Apply 1 patch twice Kadon Andrus week by transdermal route.   oxyCODONE 5 MG immediate release tablet Commonly known as: Roxicodone Take 1 tablet (5 mg total) by mouth every 6 (six) hours as needed for up to 3 days for severe  pain (pain score 7-10).   pantoprazole 40 MG tablet Commonly known as: PROTONIX Take 40 mg by mouth at bedtime.   predniSONE 10 MG tablet Commonly known as: DELTASONE Take 4 tablets (40 mg total) by mouth daily for 5 days, THEN 2 tablets (20 mg total) daily for 5 days, THEN 1 tablet (10 mg total) daily for 5 days. Start taking on: December 11, 2023   SUMAtriptan 50 MG tablet Commonly known as: IMITREX Take 50 mg by mouth every 2 (two) hours as needed for migraine.   traZODone 50 MG tablet Commonly known as: DESYREL Take 0.5 tablets (25 mg total) by mouth at bedtime as needed for sleep.   VITAMIN D PO Take 1 tablet by mouth daily.               Durable Medical Equipment  (From admission, onward)           Start     Ordered   12/11/23 1538  For home use only DME Nebulizer/meds  Once       Question Answer Comment  Patient needs Raziah Funnell nebulizer to treat with the following condition Asthma   Length of Need Lifetime      12/11/23 1537           Allergies  Allergen Reactions   Albuterol Shortness Of Breath and Palpitations    Also sensitive to duo-neb. Ok to use Xopenex    Sulfa Antibiotics Swelling      The results of significant diagnostics from this hospitalization (including imaging, microbiology, ancillary and laboratory) are listed below for reference.    Significant Diagnostic Studies: DG CHEST PORT 1 VIEW Result Date: 12/10/2023 CLINICAL DATA:  Chest pain, shortness of breath. EXAM: PORTABLE CHEST 1 VIEW COMPARISON:  December 03, 2023. FINDINGS: The heart size and mediastinal contours are within normal limits. Hypoinflation of the lungs with minimal bibasilar subsegmental atelectasis. The visualized skeletal structures are unremarkable. IMPRESSION: Hypoinflation of the lungs with minimal bibasilar subsegmental atelectasis. Electronically Signed   By: Lupita Raider M.D.   On: 12/10/2023 13:32   CT CHEST WO CONTRAST Result Date: 12/07/2023 CLINICAL DATA:   Respiratory illness.  Shortness of breath. EXAM: CT CHEST WITHOUT CONTRAST TECHNIQUE: Multidetector CT imaging of the chest was performed following the standard protocol without IV contrast. RADIATION DOSE REDUCTION: This exam was performed according to  the departmental dose-optimization program which includes automated exposure control, adjustment of the mA and/or kV according to patient size and/or use of iterative reconstruction technique. COMPARISON:  Chest radiograph dated 12/03/2023. FINDINGS: Evaluation of this exam is limited in the absence of intravenous contrast as well as due to respiratory motion. Cardiovascular: There is no cardiomegaly or pericardial effusion. The thoracic aorta and the central pulmonary arteries appear unremarkable on this noncontrast CT. Mediastinum/Nodes: No hilar or mediastinal adenopathy. Evaluation however is limited in the absence of intravenous contrast and due to respiratory motion port the esophagus and the thyroid gland are grossly unremarkable no mediastinal fluid collection. Lungs/Pleura: No focal consolidation, pleural effusion, or pneumothorax. The central airways are patent. Upper Abdomen: No acute abnormality. Musculoskeletal: No acute osseous pathology. IMPRESSION: No acute intrathoracic pathology. Electronically Signed   By: Elgie Collard M.D.   On: 12/07/2023 16:30   DG Chest Port 1 View Result Date: 12/03/2023 CLINICAL DATA:  Shortness of breath EXAM: PORTABLE CHEST 1 VIEW COMPARISON:  11/07/2022 FINDINGS: The heart size and mediastinal contours are within normal limits. Low lung volumes with crowding of the central bronchovascular markings. No focal airspace consolidation, pleural effusion, or pneumothorax. The visualized skeletal structures are unremarkable. IMPRESSION: Low lung volumes. No acute cardiopulmonary findings. Electronically Signed   By: Duanne Guess D.O.   On: 12/03/2023 15:57    Microbiology: Recent Results (from the past 240 hours)   Resp panel by RT-PCR (RSV, Flu Mackena Plummer&B, Covid) Anterior Nasal Swab     Status: Abnormal   Collection Time: 12/03/23  2:58 PM   Specimen: Anterior Nasal Swab  Result Value Ref Range Status   SARS Coronavirus 2 by RT PCR NEGATIVE NEGATIVE Final    Comment: (NOTE) SARS-CoV-2 target nucleic acids are NOT DETECTED.  The SARS-CoV-2 RNA is generally detectable in upper respiratory specimens during the acute phase of infection. The lowest concentration of SARS-CoV-2 viral copies this assay can detect is 138 copies/mL. Megan Roach negative result does not preclude SARS-Cov-2 infection and should not be used as the sole basis for treatment or other patient management decisions. Megan Roach negative result may occur with  improper specimen collection/handling, submission of specimen other than nasopharyngeal swab, presence of viral mutation(s) within the areas targeted by this assay, and inadequate number of viral copies(<138 copies/mL). Megan Roach negative result must be combined with clinical observations, patient history, and epidemiological information. The expected result is Negative.  Fact Sheet for Patients:  BloggerCourse.com  Fact Sheet for Healthcare Providers:  SeriousBroker.it  This test is no t yet approved or cleared by the [place] FDA and  has been authorized for detection and/or diagnosis of SARS-CoV-2 by FDA under an Emergency Use Authorization (EUA). This EUA will remain  in effect (meaning this test can be used) for the duration of the COVID-19 declaration under Section 564(b)(1) of the Act, 21 U.S.C.section 360bbb-3(b)(1), unless the authorization is terminated  or revoked sooner.       Influenza Megan Roach by PCR NEGATIVE NEGATIVE Final   Influenza B by PCR NEGATIVE NEGATIVE Final    Comment: (NOTE) The Xpert Xpress SARS-CoV-2/FLU/RSV plus assay is intended as an aid in the diagnosis of influenza from Nasopharyngeal swab specimens and should not be  used as Megan Roach sole basis for treatment. Nasal washings and aspirates are unacceptable for Xpert Xpress SARS-CoV-2/FLU/RSV testing.  Fact Sheet for Patients: BloggerCourse.com  Fact Sheet for Healthcare Providers: SeriousBroker.it  This test is not yet approved or cleared by the [place] FDA and has been  authorized for detection and/or diagnosis of SARS-CoV-2 by FDA under an Emergency Use Authorization (EUA). This EUA will remain in effect (meaning this test can be used) for the duration of the COVID-19 declaration under Section 564(b)(1) of the Act, 21 U.S.C. section 360bbb-3(b)(1), unless the authorization is terminated or revoked.     Resp Syncytial Virus by PCR POSITIVE (Shyam Dawson) NEGATIVE Final    Comment: (NOTE) Fact Sheet for Patients: BloggerCourse.com  Fact Sheet for Healthcare Providers: SeriousBroker.it  This test is not yet approved or cleared by the [place] FDA and has been authorized for detection and/or diagnosis of SARS-CoV-2 by FDA under an Emergency Use Authorization (EUA). This EUA will remain in effect (meaning this test can be used) for the duration of the COVID-19 declaration under Section 564(b)(1) of the Act, 21 U.S.C. section 360bbb-3(b)(1), unless the authorization is terminated or revoked.  Performed at [facility], [address] [place], [place] [address]      Labs: Basic Metabolic Panel: Recent Labs  Lab 12/07/23 0422 12/08/23 0355 12/11/23 0349  NA 136 134* 137  K 4.3 4.6 3.6  CL 103 99 104  CO2 25 24 24   GLUCOSE 127* 130* 115*  BUN 25* 25* 29*  CREATININE 1.00 0.86 1.09*  CALCIUM 9.0 9.1 8.5*  MG 2.2 2.3 2.1  PHOS 3.6 4.5 4.4   Liver Function Tests: Recent Labs  Lab 12/08/23 0355 12/11/23 0349  AST 30 14*  ALT 65* 41  ALKPHOS 57 49  BILITOT 0.7 0.5  PROT 6.9 5.7*  ALBUMIN 3.6 3.0*   No results for  input(s): "LIPASE", "AMYLASE" in the last 168 hours. No results for input(s): "AMMONIA" in the last 168 hours. CBC: Recent Labs  Lab 12/07/23 0422 12/08/23 0355 12/11/23 0349  WBC 11.0* 12.5* 12.4*  NEUTROABS  --  8.9* 7.5  HGB 13.0 13.1 12.6  HCT 39.8 40.5 38.9  MCV 88.8 89.2 89.4  PLT 331 334 328   Cardiac Enzymes: No results for input(s): "CKTOTAL", "CKMB", "CKMBINDEX", "TROPONINI" in the last 168 hours. BNP: BNP (last 3 results) No results for input(s): "BNP" in the last 8760 hours.  ProBNP (last 3 results) No results for input(s): "PROBNP" in the last 8760 hours.  CBG: No results for input(s): "GLUCAP" in the last 168 hours.     Signed:  Lacretia Nicks MD.  [facility] 12/11/2023, 3:51 PM

## 2023-12-12 ENCOUNTER — Telehealth: Payer: Self-pay

## 2023-12-12 ENCOUNTER — Other Ambulatory Visit: Payer: Self-pay | Admitting: Psychiatry

## 2023-12-12 ENCOUNTER — Other Ambulatory Visit (HOSPITAL_COMMUNITY): Payer: Self-pay

## 2023-12-12 DIAGNOSIS — F331 Major depressive disorder, recurrent, moderate: Secondary | ICD-10-CM

## 2023-12-12 DIAGNOSIS — F330 Major depressive disorder, recurrent, mild: Secondary | ICD-10-CM

## 2023-12-12 DIAGNOSIS — F418 Other specified anxiety disorders: Secondary | ICD-10-CM

## 2023-12-12 MED ORDER — ESCITALOPRAM OXALATE 10 MG PO TABS: 15.0000 mg | ORAL_TABLET | Freq: Every day | ORAL | 0 refills | Status: DC

## 2023-12-12 NOTE — Telephone Encounter (Signed)
 Received fax from patients pharmacy requesting a refill for the following medication please advise   escitalopram (LEXAPRO) 10 MG tablet   Last visit 12-01-23 Next visit 01-11-24   Preferred pharmacy   [facility] [phone] - [place], [place] - [address] [place] [address] AT [place] [address] Phone: [phone]  Fax: [phone]

## 2023-12-12 NOTE — Addendum Note (Signed)
 Addended byJomarie Longs on: 12/12/2023 04:39 PM   Modules accepted: Orders

## 2023-12-12 NOTE — Telephone Encounter (Signed)
 I have sent Lexapro to pharmacy.

## 2023-12-13 ENCOUNTER — Encounter (INDEPENDENT_AMBULATORY_CARE_PROVIDER_SITE_OTHER): Payer: Self-pay | Admitting: Otolaryngology

## 2024-01-11 ENCOUNTER — Ambulatory Visit: Payer: BC Managed Care – PPO | Admitting: Psychiatry

## 2024-01-24 ENCOUNTER — Encounter: Payer: Self-pay | Admitting: Internal Medicine

## 2024-01-24 ENCOUNTER — Ambulatory Visit: Payer: BC Managed Care – PPO | Admitting: Internal Medicine

## 2024-01-24 VITALS — BP 122/74 | HR 78 | Ht 59.0 in | Wt 157.6 lb

## 2024-01-24 DIAGNOSIS — J309 Allergic rhinitis, unspecified: Secondary | ICD-10-CM

## 2024-01-24 DIAGNOSIS — J210 Acute bronchiolitis due to respiratory syncytial virus: Secondary | ICD-10-CM | POA: Diagnosis not present

## 2024-01-24 DIAGNOSIS — K219 Gastro-esophageal reflux disease without esophagitis: Secondary | ICD-10-CM | POA: Diagnosis not present

## 2024-01-24 DIAGNOSIS — J4540 Moderate persistent asthma, uncomplicated: Secondary | ICD-10-CM | POA: Diagnosis not present

## 2024-01-24 MED ORDER — LEVALBUTEROL TARTRATE 45 MCG/ACT IN AERO: 2.0000 | INHALATION_SPRAY | Freq: Four times a day (QID) | RESPIRATORY_TRACT | 3 refills | Status: AC | PRN

## 2024-01-24 MED ORDER — LEVALBUTEROL HCL 1.25 MG/0.5ML IN NEBU: 1.2500 mg | INHALATION_SOLUTION | RESPIRATORY_TRACT | 5 refills | Status: AC | PRN

## 2024-01-24 NOTE — Patient Instructions (Addendum)
 It was a pleasure to see you today!  Please schedule follow up with myself in 6 months.  If my schedule is not open yet, we will contact you with a reminder closer to that time. Please call [phone] if you haven't heard from [place] a month before, and always call us sooner if issues or concerns arise. You can also send [place] a message through MyChart, but but aware that this is not to be used for urgent issues and it may take up to 5-7 days to receive a reply. Please be aware that you will likely be able to view your results before I have a chance to respond to them. Please give [place] 5 business days to respond to any non-urgent results.    For now I would continue your Symbicort 2 puffs twice daily.  Gargle after use.  When you are starting to feel better you can try to move to 2 puffs once daily.  We can follow-up in a few months and see how your asthma control is doing.  For your chronic allergic rhinitis I would try a nonsedating antihistamine such as Allegra or Claritin.  You can do nasal saline rinses to see if that helps irrigate your sinuses.  Controlling your sinus issues is important in controlling your asthma.  I agree with following up with the ear nose and throat doctor.  I have refilled your Xopenex inhaler and nebulizer today.  Continue the montelukast once daily.  This treats asthma and allergies.  Continue the pantoprazole for reflux.

## 2024-01-24 NOTE — Progress Notes (Signed)
 Febe Champa    [id number]    01/30/1976  Primary Care Physician:Blomgren, Theron Arista, MD Date of Appointment: 01/24/2024 Established Patient Visit  Chief complaint:   Chief Complaint  Patient presents with   Consult    Breathing issue due to hx of RSV.     HPI: Megan Roach is a 48 y.o. woman with past medical history of asthma. Hospitalized in February 2025 with RSV infection. Treatd with steroids, discharged on ICS-LABA.  Interval Updates: Here for hospital follow up. Overall doing better. Has good days and bad days.  Feels chest heaviness/tightness on some days, usually during the day, sometimes towards the end of the day.  Age 48 started having asthma symptoms.  [place]. Lived in [place] and [place] and minimal asthma symptoms. Has been in [place] for 3 years and notes worsening asthma symptoms since then.  Has been on and off symbicort for many years - prior to her hospitalization was not adherent to symbicort.   Has taken zyrtec in the past - felt groggy on this. Has tried astelin - she felt it blocked her sinuses. She then tried flonase - also didn't tolerate.   Current Regimen: symbicort 160 2 puffs twice daily, levalbuterol use about once/week. Asthma Triggers: pollen, seasonal changes Exacerbations in the last year: once in 2025 feb. History of hospitalization or intubation: yes in feb 2025 with RSV Allergy Testing/rhinitis: on singulair GERD: yes controlled pantoprazole. ACT:  Asthma Control Test ACT Total Score  01/24/2024 11:27 AM 19   FeNO: Serum Eos/IgE:   She is a free lance designer.   I have reviewed the patient's family social and past medical history and updated as appropriate.   Past Medical History:  Diagnosis Date   Asthma     Past Surgical History:  Procedure Laterality Date   CESAREAN SECTION     total of 3   RECTAL PROLAPSE REPAIR     TOTAL VAGINAL HYSTERECTOMY      Family History   Problem Relation Age of Onset   Depression Mother    Asthma Mother    Bladder Cancer Father    Idiopathic pulmonary fibrosis Father    Alcohol abuse Brother    Gallbladder disease Brother    Liver disease Brother    Anxiety disorder Son    ADD / ADHD Son    Cervical cancer Other    Colon cancer Neg Hx    Colon polyps Neg Hx    Esophageal cancer Neg Hx    Stomach cancer Neg Hx    Pancreatic cancer Neg Hx    Rectal cancer Neg Hx     Social History   Occupational History   Not on file  Tobacco Use   Smoking status: Never   Smokeless tobacco: Never  Vaping Use   Vaping status: Never Used  Substance and Sexual Activity   Alcohol use: Yes    Alcohol/week: 1.0 standard drink of alcohol    Types: 1 Glasses of wine per week    Comment: socially   Drug use: Never   Sexual activity: Yes     Physical Exam: Blood pressure 122/74, pulse 78, height 4\' 11"  (1.499 m), weight 157 lb 9.6 oz (71.5 kg), SpO2 99%.  Gen:      No acute distress, occasional cough with throat clearing ENT:  mallampati IV, mild nasal debris and erythema, no nasal polyps, mucus membranes moist Lungs:  No increased respiratory effort, symmetric chest wall excursion, clear to auscultation bilaterally, no wheezes or crackles CV:         Regular rate and rhythm; no murmurs, rubs, or gallops.  No pedal edema   Data Reviewed: Imaging: I have personally reviewed the CT Chest Feb 2025 - no acute cardiopulmonary process  PFTs:   Labs: Lab Results  Component Value Date   WBC 12.4 (H) 12/11/2023   HGB 12.6 12/11/2023   HCT 38.9 12/11/2023   MCV 89.4 12/11/2023   PLT 328 12/11/2023   Lab Results  Component Value Date   NA 137 12/11/2023   K 3.6 12/11/2023   CO2 24 12/11/2023   GLUCOSE 115 (H) 12/11/2023   BUN 29 (H) 12/11/2023   CREATININE 1.09 (H) 12/11/2023   CALCIUM 8.5 (L) 12/11/2023   GFR 86.40 12/03/2021   GFRNONAA >60 12/11/2023    Immunization status: Immunization History   Administered Date(s) Administered   Influenza,inj,Quad PF,6+ Mos 10/15/2021   Moderna Sars-Covid-2 Vaccination 01/10/2020, 02/07/2020   PFIZER(Purple Top)SARS-COV-2 Vaccination 09/26/2020    External Records Personally Reviewed:   Assessment:  Moderate persistent asthma, improved control GERD, controlled Chronic allergic rhinitis, not well controlled Recent RSV bronchiolitis  Plan/Recommendations: For now I would continue your Symbicort 2 puffs twice daily.  Gargle after use.  When you are starting to feel better you can try to move to 2 puffs once daily.  We can follow-up in a few months and see how your asthma control is doing.  For your chronic allergic rhinitis I would try a nonsedating antihistamine such as Allegra or Claritin.  You can do nasal saline rinses to see if that helps irrigate your sinuses.  Controlling your sinus issues is important in controlling your asthma.  I agree with following up with the ear nose and throat doctor.  I have refilled your Xopenex inhaler and nebulizer today.  Continue the montelukast once daily.  This treats asthma and allergies.  Continue the pantoprazole for reflux.   Return to Care: Return in about 6 months (around 07/25/2024).   Durel Salts, MD Pulmonary and Critical Care Medicine [facility] [phone]

## 2024-02-09 ENCOUNTER — Institutional Professional Consult (permissible substitution) (INDEPENDENT_AMBULATORY_CARE_PROVIDER_SITE_OTHER): Payer: BC Managed Care – PPO | Admitting: Otolaryngology

## 2024-02-09 NOTE — Progress Notes (Deleted)
 ENT CONSULT:  Reason for Consult: ***   HPI: Discussed the use of AI scribe software for clinical note transcription with the patient, who gave verbal consent to proceed.  History of Present Illness      Records Reviewed:  Pulm office visit 01/24/24 Dr Megan Roach  Megan Roach is a 48 y.o. woman with past medical history of asthma. Hospitalized in February 2025 with RSV infection. Treatd with steroids, discharged on ICS-LABA.   Moderate persistent asthma, improved control GERD, controlled Chronic allergic rhinitis, not well controlled Recent RSV bronchiolitis   Plan/Recommendations: For now I would continue your Symbicort  2 puffs twice daily.  Gargle after use.  When you are starting to feel better you can try to move to 2 puffs once daily.  We can follow-up in a few months and see how your asthma control is doing.   For your chronic allergic rhinitis I would try a nonsedating antihistamine such as Allegra or Claritin.  You can do nasal saline rinses to see if that helps irrigate your sinuses.  Controlling your sinus issues is important in controlling your asthma.  I agree with following up with the ear nose and throat doctor.   I have refilled your Xopenex  inhaler and nebulizer today.   Continue the montelukast once daily.  This treats asthma and allergies.   Continue the pantoprazole  for reflux.  Discharge summary 12/11/23 Megan Roach is a 48 yo female with PMH reactive airway disease, anxiety who presented with cough, wheezing, SOB.  She has been around sick family members prior to admission but states they tested negative on respiratory swabs.   She was found to be positive for RSV on admission.  CXR negative for acute findings.   She was started on steroids and breathing treatments and admitted for further monitoring.   She's had extended course and very slow improvement.  Pulmonary was consulted and recommended steroid taper, outpatient pulm and ENT follow up.  She's  gradually improved, stable for discharge today.      Past Medical History:  Diagnosis Date   Asthma     Past Surgical History:  Procedure Laterality Date   CESAREAN SECTION     total of 3   RECTAL PROLAPSE REPAIR     TOTAL VAGINAL HYSTERECTOMY      Family History  Problem Relation Age of Onset   Depression Mother    Asthma Mother    Bladder Cancer Father    Idiopathic pulmonary fibrosis Father    Alcohol abuse Brother    Gallbladder disease Brother    Liver disease Brother    Anxiety disorder Son    ADD / ADHD Son    Cervical cancer Other    Colon cancer Neg Hx    Colon polyps Neg Hx    Esophageal cancer Neg Hx    Stomach cancer Neg Hx    Pancreatic cancer Neg Hx    Rectal cancer Neg Hx     Social History:  reports that she has never smoked. She has never used smokeless tobacco. She reports current alcohol use of about 1.0 standard drink of alcohol per week. She reports that she does not use drugs.  Allergies:  Allergies  Allergen Reactions   Albuterol  Shortness Of Breath and Palpitations    Also sensitive to duo-neb. Ok to use Xopenex     Sulfa Antibiotics Swelling    Medications: I have reviewed the patient's current medications.  The PMH, PSH, Medications, Allergies, and SH were reviewed and updated.  ROS: Constitutional: Negative for fever, weight loss and weight gain. Cardiovascular: Negative for chest pain and dyspnea on exertion. Respiratory: Is not experiencing shortness of breath at rest. Gastrointestinal: Negative for nausea and vomiting. Neurological: Negative for headaches. Psychiatric: The patient is not nervous/anxious  There were no vitals taken for this visit. There is no height or weight on file to calculate BMI.  PHYSICAL EXAM:  Exam: General: Well-developed, well-nourished Communication and Voice: Clear pitch and clarity Respiratory Respiratory effort: Equal inspiration and expiration without stridor Cardiovascular Peripheral  Vascular: Warm extremities with equal color/perfusion Eyes: No nystagmus with equal extraocular motion bilaterally Neuro/Psych/Balance: Patient oriented to person, place, and time; Appropriate mood and affect; Gait is intact with no imbalance; Cranial nerves I-XII are intact Head and Face Inspection: Normocephalic and atraumatic without mass or lesion Palpation: Facial skeleton intact without bony stepoffs Salivary Glands: No mass or tenderness Facial Strength: Facial motility symmetric and full bilaterally ENT Pinna: External ear intact and fully developed External canal: Canal is patent with intact skin Tympanic Membrane: Clear and mobile External Nose: No scar or anatomic deformity Internal Nose: Septum is ***. No polyp, or purulence. Mucosal edema and erythema present.  Bilateral inferior turbinate hypertrophy.  Lips, Teeth, and gums: Mucosa and teeth intact and viable TMJ: No pain to palpation with full mobility Oral cavity/oropharynx: No erythema or exudate, no lesions present Nasopharynx: No mass or lesion with intact mucosa Hypopharynx: Intact mucosa without pooling of secretions Larynx Glottic: Full true vocal cord mobility without lesion or mass Supraglottic: Normal appearing epiglottis and AE folds Interarytenoid Space: Moderate pachydermia&edema Subglottic Space: Patent without lesion or edema Neck Neck and Trachea: Midline trachea without mass or lesion Thyroid: No mass or nodularity Lymphatics: No lymphadenopathy  Procedure:   PROCEDURE NOTE: nasal endoscopy  Preoperative diagnosis: chronic sinusitis symptoms  Postoperative diagnosis: same  Procedure: Diagnostic nasal endoscopy ([id number])  Surgeon: Megan Roach, M.D.  Anesthesia: Topical lidocaine  and Afrin  H&P REVIEW: The patient's history and physical were reviewed today prior to procedure. All medications were reviewed and updated as well. Complications: None Condition is stable throughout  exam Indications and consent: The patient presents with symptoms of chronic sinusitis not responding to previous therapies. All the risks, benefits, and potential complications were reviewed with the patient preoperatively and informed consent was obtained. The time out was completed with confirmation of the correct procedure.   Procedure: The patient was seated upright in the clinic. Topical lidocaine  and Afrin were applied to the nasal cavity. After adequate anesthesia had occurred, the rigid nasal endoscope was passed into the nasal cavity. The nasal mucosa, turbinates, septum, and sinus drainage pathways were visualized bilaterally. This revealed *** purulence or significant secretions that might be cultured. There were *** polyps or sites of significant inflammation. The mucosa was intact and there *** crusting present. The scope was then slowly withdrawn and the patient tolerated the procedure well. There were no complications or blood loss.      Studies Reviewed:***  Assessment/Plan: No diagnosis found.  Assessment and Plan Assessment & Plan       Thank you for allowing me to participate in the care of this patient. Please do not hesitate to contact me with any questions or concerns.   Megan Last, MD Otolaryngology [facility] Phone: [phone] Fax: [phone]    02/09/2024, 8:46 AM

## 2024-03-14 ENCOUNTER — Other Ambulatory Visit: Payer: Self-pay | Admitting: Psychiatry

## 2024-03-14 DIAGNOSIS — F331 Major depressive disorder, recurrent, moderate: Secondary | ICD-10-CM

## 2024-03-14 DIAGNOSIS — F418 Other specified anxiety disorders: Secondary | ICD-10-CM

## 2024-04-09 ENCOUNTER — Other Ambulatory Visit: Payer: Self-pay | Admitting: Psychiatry

## 2024-04-09 DIAGNOSIS — F418 Other specified anxiety disorders: Secondary | ICD-10-CM

## 2024-04-09 DIAGNOSIS — F331 Major depressive disorder, recurrent, moderate: Secondary | ICD-10-CM

## 2024-07-03 ENCOUNTER — Encounter (INDEPENDENT_AMBULATORY_CARE_PROVIDER_SITE_OTHER): Payer: Self-pay

## 2024-08-15 ENCOUNTER — Encounter (HOSPITAL_BASED_OUTPATIENT_CLINIC_OR_DEPARTMENT_OTHER): Payer: Self-pay | Admitting: Internal Medicine

## 2024-08-15 DIAGNOSIS — G4700 Insomnia, unspecified: Secondary | ICD-10-CM

## 2024-08-26 ENCOUNTER — Encounter: Payer: Self-pay | Admitting: Nurse Practitioner

## 2024-08-26 ENCOUNTER — Ambulatory Visit: Admitting: Nurse Practitioner

## 2024-08-26 VITALS — BP 131/82 | HR 69 | Temp 98.0°F | Ht 59.0 in | Wt 163.0 lb

## 2024-08-26 DIAGNOSIS — E785 Hyperlipidemia, unspecified: Secondary | ICD-10-CM | POA: Diagnosis not present

## 2024-08-26 DIAGNOSIS — Z6832 Body mass index (BMI) 32.0-32.9, adult: Secondary | ICD-10-CM

## 2024-08-26 DIAGNOSIS — E66811 Obesity, class 1: Secondary | ICD-10-CM

## 2024-08-26 NOTE — Progress Notes (Signed)
 Office: [phone]  /  Fax: [phone]   Initial Visit  Megan Roach was seen in clinic today to evaluate for obesity. She is interested in losing weight to improve overall health and reduce the risk of weight related complications. She presents today to review program treatment options, initial physical assessment, and evaluation.     She was referred by: PCP  When asked what else they would like to accomplish? She states: Adopt a healthier eating pattern and lifestyle, Improve energy levels and physical activity, Improve existing medical conditions, and Improve quality of life   When asked how has your weight affected you? She states: Contributed to medical problems, Contributed to orthopedic problems or mobility issues, Having fatigue, and Having poor endurance  Some associated conditions: asthma, anxiety, prolonged QT interval, depression, HLD, insomnia, headaches  Contributing factors: family history of obesity, use of obesogenic medications: Steroids, and menopause  Weight promoting medications identified: Steroids  Current nutrition plan: None  Current level of physical activity: walking dog twice daily  Current or previous pharmacotherapy: None  Response to medication: Never tried medications   Past medical history includes:   Past Medical History:  Diagnosis Date   Asthma      Objective:   BP 131/82   Pulse 69   Temp 98 F (36.7 C)   Ht 4' 11 (1.499 m)   Wt 163 lb (73.9 kg)   SpO2 98%   BMI 32.92 kg/m  She was weighed on the bioimpedance scale: Body mass index is 32.92 kg/m.  Peak Weight:170 lbs , Body Fat%:40.3%, Visceral Fat Rating:10, Weight trend over the last 12 months: Increasing  General:  Alert, oriented and cooperative. Patient is in no acute distress.  Respiratory: Normal respiratory effort, no problems with respiration noted   Gait: able to ambulate independently  Mental Status: Normal mood and affect. Normal behavior. Normal  judgment and thought content.   DIAGNOSTIC DATA REVIEWED:  BMET    Component Value Date/Time   NA 137 12/11/2023 0349   K 3.6 12/11/2023 0349   CL 104 12/11/2023 0349   CO2 24 12/11/2023 0349   GLUCOSE 115 (H) 12/11/2023 0349   BUN 29 (H) 12/11/2023 0349   CREATININE 1.09 (H) 12/11/2023 0349   CALCIUM 8.5 (L) 12/11/2023 0349   GFRNONAA >60 12/11/2023 0349   Lab Results  Component Value Date   HGBA1C 5.4 12/03/2021   No results found for: INSULIN CBC    Component Value Date/Time   WBC 12.4 (H) 12/11/2023 0349   RBC 4.35 12/11/2023 0349   HGB 12.6 12/11/2023 0349   HCT 38.9 12/11/2023 0349   PLT 328 12/11/2023 0349   MCV 89.4 12/11/2023 0349   MCH 29.0 12/11/2023 0349   MCHC 32.4 12/11/2023 0349   RDW 14.6 12/11/2023 0349   Iron/TIBC/Ferritin/ %Sat No results found for: IRON, TIBC, FERRITIN, IRONPCTSAT Lipid Panel     Component Value Date/Time   CHOL 195 01/30/2022 0322   TRIG 54 01/30/2022 0322   HDL 64 01/30/2022 0322   CHOLHDL 3.0 01/30/2022 0322   VLDL 11 01/30/2022 0322   LDLCALC 120 (H) 01/30/2022 0322   Hepatic Function Panel     Component Value Date/Time   PROT 5.7 (L) 12/11/2023 0349   ALBUMIN 3.0 (L) 12/11/2023 0349   AST 14 (L) 12/11/2023 0349   ALT 41 12/11/2023 0349   ALKPHOS 49 12/11/2023 0349   BILITOT 0.5 12/11/2023 0349   BILIDIR <0.1 12/04/2023 1001   IBILI NOT CALCULATED 12/04/2023 1001  Component Value Date/Time   TSH 1.98 12/03/2021 0935     Assessment and Plan:   Hyperlipidemia, unspecified hyperlipidemia type  Obesity, Class I, BMI 30-34.9        Obesity Treatment / Action Plan:  Patient will work on garnering support from family and friends to begin weight loss journey. Will work on eliminating or reducing the presence of highly palatable, calorie dense foods in the home. Will complete provided nutritional and psychosocial assessment questionnaire before the next appointment. Will be scheduled for  indirect calorimetry to determine resting energy expenditure in a fasting state.  This will allow us  to create a reduced calorie, high-protein meal plan to promote loss of fat mass while preserving muscle mass. Counseled on the health benefits of losing 5%-15% of total body weight. Was counseled on nutritional approaches to weight loss and benefits of reducing processed foods and consuming plant-based foods and high quality protein as part of nutritional weight management. Was counseled on pharmacotherapy and role as an adjunct in weight management.   Obesity Education Performed Today:  She was weighed on the bioimpedance scale and results were discussed and documented in the synopsis.  We discussed obesity as a disease and the importance of a more detailed evaluation of all the factors contributing to the disease.  We discussed the importance of long term lifestyle changes which include nutrition, exercise and behavioral modifications as well as the importance of customizing this to her specific health and social needs.  We discussed the benefits of reaching a healthier weight to alleviate the symptoms of existing conditions and reduce the risks of the biomechanical, metabolic and psychological effects of obesity.  Megan Roach appears to be in the action stage of change and states they are ready to start intensive lifestyle modifications and behavioral modifications.  30 minutes was spent today on this visit including the above counseling, pre-visit chart review, and post-visit documentation.  Reviewed by clinician on day of visit: allergies, medications, problem list, medical history, surgical history, family history, social history, and previous encounter notes pertinent to obesity diagnosis.    [name] Roshana Shuffield FNP-C

## 2024-09-05 ENCOUNTER — Encounter: Payer: Self-pay | Admitting: Family Medicine

## 2024-09-05 ENCOUNTER — Ambulatory Visit (INDEPENDENT_AMBULATORY_CARE_PROVIDER_SITE_OTHER): Admitting: Family Medicine

## 2024-09-05 VITALS — BP 127/82 | HR 74 | Temp 98.6°F | Ht 59.0 in | Wt 159.0 lb

## 2024-09-05 DIAGNOSIS — R0602 Shortness of breath: Secondary | ICD-10-CM | POA: Diagnosis not present

## 2024-09-05 DIAGNOSIS — E785 Hyperlipidemia, unspecified: Secondary | ICD-10-CM | POA: Diagnosis not present

## 2024-09-05 DIAGNOSIS — Z1331 Encounter for screening for depression: Secondary | ICD-10-CM | POA: Diagnosis not present

## 2024-09-05 DIAGNOSIS — G4730 Sleep apnea, unspecified: Secondary | ICD-10-CM

## 2024-09-05 DIAGNOSIS — E66811 Obesity, class 1: Secondary | ICD-10-CM

## 2024-09-05 DIAGNOSIS — Z13220 Encounter for screening for lipoid disorders: Secondary | ICD-10-CM | POA: Diagnosis not present

## 2024-09-05 DIAGNOSIS — R61 Generalized hyperhidrosis: Secondary | ICD-10-CM | POA: Diagnosis not present

## 2024-09-05 DIAGNOSIS — R5383 Other fatigue: Secondary | ICD-10-CM

## 2024-09-05 DIAGNOSIS — Z6832 Body mass index (BMI) 32.0-32.9, adult: Secondary | ICD-10-CM

## 2024-09-05 NOTE — Progress Notes (Signed)
 At a Glance:  Vitals Temp: 98.6 F (37 C) BP: 127/82 Pulse Rate: 74 SpO2: 98 %   Anthropometric Measurements Height: 4' 11 (1.499 m) Weight: 159 lb (72.1 kg) BMI (Calculated): 32.1 Starting Weight: 159lb Peak Weight: 170lb   Body Composition  Body Fat %: 39.2 % Fat Mass (lbs): 62.4 lbs Muscle Mass (lbs): 91.8 lbs Total Body Water (lbs): 62.6 lbs Visceral Fat Rating : 9   Other Clinical Data RMR: 1166 Fasting: yes Labs: yes Today's Visit #: 1 Starting Date: 09/05/24    EKG: Normal sinus rhythm, rate 77.  Indirect Calorimeter completed today shows a VO2 of 169 and a REE of 1166.  Her calculated basal metabolic rate is 8657 thus her basal metabolic rate is worse than expected.  Chief Complaint:  Obesity   Subjective:  Megan Roach (MR# [id number]) is a 48 y.o. female who presents for evaluation and treatment of obesity and related comorbidities.   Megan Roach is currently in the action stage of change and ready to dedicate time achieving and maintaining a healthier weight. Megan Roach is interested in becoming our patient and working on intensive lifestyle modifications including (but not limited to) diet and exercise for weight loss.  Megan Roach has been struggling with her weight. She has been unsuccessful in either losing weight, maintaining weight loss, or reaching her healthy weight goal.  She is a stay-at-home mom.  She is adjusting to cooking for less people at home with her youngest in high school.  She has 2 older children at [facility].  She does some walking for exercise.  She has never used antiobesity medication.  Prolonged use of corticosteroids following asthma exacerbation with RSV, hospitalized February 2025 contributed to weight gain.  Megan Roach's habits were reviewed today and are as follows: her desired weight loss is 25 lb, she started gaining weight in early childhood.  She was athletic in her teenage years playing sports.  Her weight was stable in  the 130s between ages 67 and 1.  She gained weight after being hospitalized for RSV and asthma in February of this year.  She has gained about 20 pounds since then.  Her max weight was 170 pounds.  She has struggled overall since age 27 after having hysterectomy without oophorectomy with her weight., she skips meals frequently, and she frequently makes poor food choices. \  Other Fatigue Megan Roach admits to daytime somnolence and admits to waking up still tired. Patient has a history of symptoms of daytime fatigue. Megan Roach generally gets 4 or 5 hours of sleep per night, and states that she has nightime awakenings. Snoring is present. Apneic episodes are present. Epworth Sleepiness Score is 21.  Home sleep study has been ordered  Shortness of Breath Megan Roach notes increasing shortness of breath with exercising and seems to be worsening over time with weight gain. She notes getting out of breath sooner with activity than she used to. This has gotten worse recently. Megan Roach denies shortness of breath at rest or orthopnea.   Depression Screen Colleen's Food and Mood (modified PHQ-9) score was 18.     09/05/2024    8:35 AM  Depression screen PHQ 2/9  Decreased Interest 0  Down, Depressed, Hopeless 0  PHQ - 2 Score 0  Altered sleeping 3  Tired, decreased energy 3  Change in appetite 1  Feeling bad or failure about yourself  0  Trouble concentrating 2  Moving slowly or fidgety/restless 0  Suicidal thoughts 0  PHQ-9 Score 9  Assessment and Plan:   Other Fatigue Megan Roach does feel that her weight is causing her energy to be lower than it should be. Fatigue may be related to obesity, depression or many other causes. Labs will be ordered, and in the meanwhile, Megan Roach will focus on self care including making healthy food choices, increasing physical activity and focusing on stress reduction.  Shortness of Breath Megan Roach does feel that she gets out of breath more easily that she used  to when she exercises. Megan Roach's shortness of breath appears to be obesity related and exercise induced. She has agreed to work on weight loss and gradually increase exercise to treat her exercise induced shortness of breath. Will continue to monitor closely.  Megan Roach had a positive depression screening. Depression is commonly associated with obesity and often results in emotional eating behaviors. We will monitor this closely and work on CBT to help improve the non-hunger eating patterns. Referral to Psychology may be required if no improvement is seen as she continues in our clinic.    Problem List Items Addressed This Visit   None Visit Diagnoses       SOBOE (shortness of breath on exertion)    -  Primary     Other fatigue       Relevant Orders   EKG 12-Lead (Completed)   VITAMIN D  25 Hydroxy (Vit-D Deficiency, Fractures)   TSH   T4, free   T3   Insulin, random   Hemoglobin A1c   Folate   Comprehensive metabolic panel with GFR   Vitamin B12   CBC with Differential/Platelet     Depression screen   Reviewed PHQ-9 results      Screening, lipid       Relevant Orders   Lipid panel     Night sweat   Ovaries are intact and she is no longer on estradiol.  She has difficulty falling asleep at night and night sweats.  Check progesterone level today.   Relevant Orders   Progesterone     Obesity, Class I, BMI 30-34.9    Consider the addition of antiobesity medication but encouraged adequate nutrition intake.  Category 2 meal plan started.  Will firm up plan for more regular exercise next visit.     BMI 32.0-32.9,adult         Sleep disorder breathing     Follow-up results of home sleep study which have been ordered.  She appears to be high risk for OSA.      Hyperlipidemia, unspecified hyperlipidemia type     She is not on any lipid-lowering medication.  Check fasting lipid panel today.       [name] is currently in the action stage of change and her goal is to get back to  weightloss efforts . I recommend Megan Roach begin the structured treatment plan as follows:  She has agreed to Category 2 Plan  Exercise goals: All adults should avoid inactivity. Some activity is better than none, and adults who participate in any amount of physical activity, gain some health benefits.  Behavioral modification strategies:increasing lean protein intake, increase H2O intake, decrease liquid calories, increase high fiber foods, decreasing eating out, no skipping meals, meal planning and cooking strategies, keeping healthy foods in the home, better snacking choices, avoiding temptations, and planning for success  She was informed of the importance of frequent follow-up visits to maximize her success with intensive lifestyle modifications for her multiple health conditions. She was informed we would discuss her lab results at her  next visit unless there is a critical issue that needs to be addressed sooner. Aryam agreed to keep her next visit at the agreed upon time to discuss these results.  Objective:  General: Cooperative, alert, well developed, in no acute distress. HEENT: Conjunctivae and lids unremarkable. Cardiovascular: Regular rhythm.  Lungs: Normal work of breathing. Neurologic: No focal deficits.   Lab Results  Component Value Date   CREATININE 1.09 (H) 12/11/2023   BUN 29 (H) 12/11/2023   NA 137 12/11/2023   K 3.6 12/11/2023   CL 104 12/11/2023   CO2 24 12/11/2023   Lab Results  Component Value Date   ALT 41 12/11/2023   AST 14 (L) 12/11/2023   ALKPHOS 49 12/11/2023   BILITOT 0.5 12/11/2023   Lab Results  Component Value Date   HGBA1C 5.4 12/03/2021   No results found for: INSULIN Lab Results  Component Value Date   TSH 1.98 12/03/2021   Lab Results  Component Value Date   CHOL 195 01/30/2022   HDL 64 01/30/2022   LDLCALC 120 (H) 01/30/2022   TRIG 54 01/30/2022   CHOLHDL 3.0 01/30/2022   Lab Results  Component Value Date   WBC 12.4 (H)  12/11/2023   HGB 12.6 12/11/2023   HCT 38.9 12/11/2023   MCV 89.4 12/11/2023   PLT 328 12/11/2023   No results found for: IRON, TIBC, FERRITIN  Attestation Statements:  Reviewed by clinician on day of visit: allergies, medications, problem list, medical history, surgical history, family history, social history, and previous encounter notes.  Time spent on visit including pre-visit chart review and post-visit charting and face- to face care including nutritional counseling, review of EKG, interpretation of body composition scale and indirect calorimetry and nutrition prescription  was 41 minutes.   Darice Haddock, D.O. DABFM, DABOM [facility] [address] [place], [place] [address] [phone]

## 2024-09-06 LAB — COMPREHENSIVE METABOLIC PANEL WITH GFR
ALT: 37 IU/L — ABNORMAL HIGH (ref 0–32)
AST: 27 IU/L (ref 0–40)
Albumin: 4.4 g/dL (ref 3.9–4.9)
Alkaline Phosphatase: 84 IU/L (ref 41–116)
BUN/Creatinine Ratio: 14 (ref 9–23)
BUN: 13 mg/dL (ref 6–24)
Bilirubin Total: 0.7 mg/dL (ref 0.0–1.2)
CO2: 22 mmol/L (ref 20–29)
Calcium: 9.9 mg/dL (ref 8.7–10.2)
Chloride: 103 mmol/L (ref 96–106)
Creatinine, Ser: 0.9 mg/dL (ref 0.57–1.00)
Globulin, Total: 2.6 g/dL (ref 1.5–4.5)
Glucose: 105 mg/dL — ABNORMAL HIGH (ref 70–99)
Potassium: 4.5 mmol/L (ref 3.5–5.2)
Sodium: 140 mmol/L (ref 134–144)
Total Protein: 7 g/dL (ref 6.0–8.5)
eGFR: 78 mL/min/1.73 (ref >59)

## 2024-09-06 LAB — CBC WITH DIFFERENTIAL/PLATELET
Basophils Absolute: 0 x10E3/uL (ref 0.0–0.2)
Basos: 1 %
EOS (ABSOLUTE): 0.1 x10E3/uL (ref 0.0–0.4)
Eos: 2 %
Hematocrit: 41.8 % (ref 34.0–46.6)
Hemoglobin: 13.7 g/dL (ref 11.1–15.9)
Immature Grans (Abs): 0 x10E3/uL (ref 0.0–0.1)
Immature Granulocytes: 0 %
Lymphocytes Absolute: 1.8 x10E3/uL (ref 0.7–3.1)
Lymphs: 35 %
MCH: 29 pg (ref 26.6–33.0)
MCHC: 32.8 g/dL (ref 31.5–35.7)
MCV: 89 fL (ref 79–97)
Monocytes Absolute: 0.4 x10E3/uL (ref 0.1–0.9)
Monocytes: 7 %
Neutrophils Absolute: 2.8 x10E3/uL (ref 1.4–7.0)
Neutrophils: 55 %
Platelets: 331 x10E3/uL (ref 150–450)
RBC: 4.72 x10E6/uL (ref 3.77–5.28)
RDW: 13.2 % (ref 11.7–15.4)
WBC: 5.2 x10E3/uL (ref 3.4–10.8)

## 2024-09-06 LAB — LIPID PANEL
Chol/HDL Ratio: 3.4 ratio (ref 0.0–4.4)
Cholesterol, Total: 193 mg/dL (ref 100–199)
HDL: 56 mg/dL (ref >39)
LDL Chol Calc (NIH): 122 mg/dL — ABNORMAL HIGH (ref 0–99)
Triglycerides: 81 mg/dL (ref 0–149)
VLDL Cholesterol Cal: 15 mg/dL (ref 5–40)

## 2024-09-06 LAB — HEMOGLOBIN A1C
Est. average glucose Bld gHb Est-mCnc: 117 mg/dL
Hgb A1c MFr Bld: 5.7 % — ABNORMAL HIGH (ref 4.8–5.6)

## 2024-09-06 LAB — INSULIN, RANDOM: INSULIN: 11.4 u[IU]/mL (ref 2.6–24.9)

## 2024-09-06 LAB — VITAMIN B12: Vitamin B-12: 723 pg/mL (ref 232–1245)

## 2024-09-06 LAB — FOLATE: Folate: 6.2 ng/mL (ref >3.0)

## 2024-09-06 LAB — T4, FREE: Free T4: 1.13 ng/dL (ref 0.82–1.77)

## 2024-09-06 LAB — VITAMIN D 25 HYDROXY (VIT D DEFICIENCY, FRACTURES): Vit D, 25-Hydroxy: 33.2 ng/mL (ref 30.0–100.0)

## 2024-09-06 LAB — PROGESTERONE: Progesterone: 0.1 ng/mL

## 2024-09-06 LAB — TSH: TSH: 2.65 u[IU]/mL (ref 0.450–4.500)

## 2024-09-06 LAB — T3: T3, Total: 114 ng/dL (ref 71–180)

## 2024-09-09 ENCOUNTER — Ambulatory Visit: Payer: Self-pay | Admitting: Family Medicine

## 2024-09-23 ENCOUNTER — Ambulatory Visit (INDEPENDENT_AMBULATORY_CARE_PROVIDER_SITE_OTHER): Admitting: Family Medicine

## 2024-09-23 ENCOUNTER — Encounter: Payer: Self-pay | Admitting: Family Medicine

## 2024-09-23 VITALS — BP 126/83 | HR 64 | Temp 98.0°F | Ht 59.0 in | Wt 162.0 lb

## 2024-09-23 DIAGNOSIS — K760 Fatty (change of) liver, not elsewhere classified: Secondary | ICD-10-CM | POA: Diagnosis not present

## 2024-09-23 DIAGNOSIS — R7303 Prediabetes: Secondary | ICD-10-CM | POA: Diagnosis not present

## 2024-09-23 DIAGNOSIS — E559 Vitamin D deficiency, unspecified: Secondary | ICD-10-CM | POA: Diagnosis not present

## 2024-09-23 DIAGNOSIS — Z6832 Body mass index (BMI) 32.0-32.9, adult: Secondary | ICD-10-CM

## 2024-09-23 DIAGNOSIS — E66811 Obesity, class 1: Secondary | ICD-10-CM | POA: Diagnosis not present

## 2024-09-23 MED ORDER — VITAMIN D (ERGOCALCIFEROL) 1.25 MG (50000 UNIT) PO CAPS: 50000.0000 [IU] | ORAL_CAPSULE | ORAL | 0 refills | Status: DC

## 2024-09-23 MED ORDER — WEGOVY 0.25 MG/0.5ML ~~LOC~~ SOAJ: 0.2500 mg | SUBCUTANEOUS | 0 refills | Status: DC

## 2024-09-23 NOTE — Progress Notes (Signed)
 Office: [phone]  /  Fax: [phone]  WEIGHT SUMMARY AND BIOMETRICS  Starting Date: 09/05/24  Starting Weight: 159lb   Weight Lost Since Last Visit: 0lb   Vitals Temp: 98 F (36.7 C) BP: 126/83 Pulse Rate: 64 SpO2: 96 %   Body Composition  Body Fat %: 40.5 % Fat Mass (lbs): 65.6 lbs Muscle Mass (lbs): 91.6 lbs Total Body Water (lbs): 65 lbs Visceral Fat Rating : 10     HPI  Chief Complaint: OBESITY  Megan Roach is here to discuss her progress with her obesity treatment plan. She is on the the Category 2 Plan and states she is following her eating plan approximately 100 % of the time. She states she is exercising 30 minutes 3 times per week.  Interval History:  Since last office visit she is up 3 lb She is trying to get in the foods on her meal plan Denies cravings for sweets or meal skipping She is drinking water, coffee in the morning (has one teaspoon of sugar) Had wine at 2 parties/ celebrations She has a sleep study this week She has a home gym (not using)  Pharmacotherapy: none  PHYSICAL EXAM:  Blood pressure 126/83, pulse 64, temperature 98 F (36.7 C), height 4' 11 (1.499 m), weight 162 lb (73.5 kg), SpO2 96%. Body mass index is 32.72 kg/m.  General: She is overweight, cooperative, alert, well developed, and in no acute distress. PSYCH: Has normal mood, affect and thought process.   Lungs: Normal breathing effort, no conversational dyspnea.  ASSESSMENT AND PLAN  TREATMENT PLAN FOR OBESITY:  Recommended Dietary Goals  Megan Roach is currently in the action stage of change. As such, her goal is to continue weight management plan. She has agreed to the Category 2 Plan.  Behavioral Intervention  We discussed the following Behavioral Modification Strategies today: increasing lean protein intake to established goals, increasing vegetables, increasing lower glycemic fruits, increasing water intake , work on meal planning and preparation, keeping  healthy foods at home, continue to practice mindfulness when eating, and planning for success.  Additional resources provided today: NA  Recommended Physical Activity Goals  Megan Roach has been advised to work up to 150 minutes of moderate intensity aerobic activity a week and strengthening exercises 2-3 times per week for cardiovascular health, weight loss maintenance and preservation of muscle mass.   She has agreed to Start aerobic activity with a goal of 150 minutes a week at moderate intensity.  Begin use of home gym 30+ min 3+ days/ wk  Pharmacotherapy changes for the treatment of obesity: begin Wegovy  0.25 mg weekly Patient denies a personal or family history of pancreatitis, medullary thyroid carcinoma or multiple endocrine neoplasia type II. Recommend reviewing pen training video online.  ASSOCIATED CONDITIONS ADDRESSED TODAY  Fatty liver Hepatic steatosis seen on CT abdomen 11/18/22 ALT has been elevated without seeing a recent increase We discussed weight reduction as the primary treatment She is higher risk with IR/ prediabetes Limit ETOH intake Refer to GI for fibroscan/ fibrosis staging -     Ambulatory referral to Gastroenterology -     Wegovy ; Inject 0.25 mg into the skin once a week.  Dispense: 2 mL; Refill: 0  Vitamin D  deficiency Last vitamin D  Lab Results  Component Value Date   VD25OH 33.2 09/05/2024  Reviewed labs with patient Vitamin D  level is is the low- normal range with a goal >50 Low vitamin D  can cause fatigue, bone loss and poor immune function as reviewed with patient  Not currently taking a vitamin D  supplement. Begin: -     Vitamin D  (Ergocalciferol ); Take 1 capsule (50,000 Units total) by mouth every 7 (seven) days.  Dispense: 12 capsule; Refill: 0 Repeat lab in 3-4 mos  Obesity, Class I, BMI 30-34.9 -     Wegovy ; Inject 0.25 mg into the skin once a week.  Dispense: 2 mL; Refill: 0 She is frustrated by lack of weight loss despite changing her  food intake Has room for more consistent exercise She is a good candidate for  GLP-1 RA given PDM and Hepatic steatosis. She has had pancreatitis and understands her increased risk.  Patient was counseled on the importance of maintaining healthy lifestyle habits, including balanced nutrition, regular physical activity, and behavioral modifications, while taking antiobesity medication.  Patient verbalized understanding that medication is an adjunct to, not a replacement for, lifestyle changes and that the long-term success and weight maintenance depend on continued adherence to these strategies.  BMI 32.0-32.9,adult  Prediabetes Lab Results  Component Value Date   HGBA1C 5.7 (H) 09/05/2024   Reviewed lab with patient Continue active plan for weight loss, limiting sugar and starch intake     She was informed of the importance of frequent follow up visits to maximize her success with intensive lifestyle modifications for her multiple health conditions.   ATTESTASTION STATEMENTS:  Reviewed by clinician on day of visit: allergies, medications, problem list, medical history, surgical history, family history, social history, and previous encounter notes pertinent to obesity diagnosis.   I have personally spent 31 minutes total time today in preparation, patient care, nutritional counseling and education,  and documentation for this visit, including the following: review of most recent clinical lab tests, prescribing medications/ refilling medications, reviewing medical assistant documentation, review and interpretation of bioimpedence results.     Megan Roach, D.O. DABFM, DABOM [facility] [address] [place], [place] [address] [phone]

## 2024-09-23 NOTE — Patient Instructions (Signed)
 Check out [email or web address] Begin Wegovy  at 0.25 mg weekly injection  Proceed with sleep study  Follow up with OB re: low progesterone   Try adding in home workouts/ walking 30-45 min 3 days/ wk  You can add in more fresh or frozen fruit (2 servings of any daily)  You can add on carb serving to dinner (1/4 plate size)

## 2024-09-24 ENCOUNTER — Telehealth: Payer: Self-pay | Admitting: *Deleted

## 2024-09-24 NOTE — Telephone Encounter (Signed)
 Prior authorization done via cover my meds for Wegovy . Waiting on determination.

## 2024-09-25 ENCOUNTER — Ambulatory Visit (HOSPITAL_BASED_OUTPATIENT_CLINIC_OR_DEPARTMENT_OTHER): Admitting: Internal Medicine

## 2024-09-25 DIAGNOSIS — G4700 Insomnia, unspecified: Secondary | ICD-10-CM

## 2024-09-25 NOTE — Telephone Encounter (Signed)
 Wegovy  is approved. Until 03/23/2025.

## 2024-10-21 ENCOUNTER — Ambulatory Visit (INDEPENDENT_AMBULATORY_CARE_PROVIDER_SITE_OTHER): Admitting: Family Medicine

## 2024-10-21 ENCOUNTER — Encounter: Payer: Self-pay | Admitting: Family Medicine

## 2024-10-21 ENCOUNTER — Ambulatory Visit (HOSPITAL_BASED_OUTPATIENT_CLINIC_OR_DEPARTMENT_OTHER): Admitting: Internal Medicine

## 2024-10-21 VITALS — BP 126/76 | HR 94 | Ht 59.0 in | Wt 163.0 lb

## 2024-10-21 DIAGNOSIS — Z6832 Body mass index (BMI) 32.0-32.9, adult: Secondary | ICD-10-CM

## 2024-10-21 DIAGNOSIS — E66811 Obesity, class 1: Secondary | ICD-10-CM

## 2024-10-21 NOTE — Progress Notes (Signed)
 Patient here for a weight check. 2 week follow up made with Dr. Waylan.   Patient has a net weight gain of 4 lb in 1.5 mos She recently started Wegovy  0.25 mg x 2 injections w/o problem Continue last 2 pens of Wegovy  and follow up in office in 2 weeks Continue prescribed meal plan.  Darice Waylan, DO

## 2024-10-23 ENCOUNTER — Telehealth: Payer: Self-pay | Admitting: *Deleted

## 2024-10-23 NOTE — Telephone Encounter (Signed)
Referral sent to [facility].

## 2024-10-28 ENCOUNTER — Other Ambulatory Visit: Payer: Self-pay | Admitting: Family Medicine

## 2024-10-28 DIAGNOSIS — E66811 Obesity, class 1: Secondary | ICD-10-CM

## 2024-10-28 DIAGNOSIS — K760 Fatty (change of) liver, not elsewhere classified: Secondary | ICD-10-CM

## 2024-11-04 ENCOUNTER — Ambulatory Visit: Admitting: Family Medicine

## 2024-11-04 VITALS — BP 119/82 | HR 77 | Ht 59.0 in | Wt 160.0 lb

## 2024-11-04 DIAGNOSIS — K760 Fatty (change of) liver, not elsewhere classified: Secondary | ICD-10-CM

## 2024-11-04 DIAGNOSIS — E66811 Obesity, class 1: Secondary | ICD-10-CM | POA: Diagnosis not present

## 2024-11-04 DIAGNOSIS — E559 Vitamin D deficiency, unspecified: Secondary | ICD-10-CM

## 2024-11-04 DIAGNOSIS — Z6832 Body mass index (BMI) 32.0-32.9, adult: Secondary | ICD-10-CM | POA: Diagnosis not present

## 2024-11-04 MED ORDER — VITAMIN D (ERGOCALCIFEROL) 1.25 MG (50000 UNIT) PO CAPS: 50000.0000 [IU] | ORAL_CAPSULE | ORAL | 0 refills | Status: AC

## 2024-11-04 MED ORDER — WEGOVY 0.5 MG/0.5ML ~~LOC~~ SOAJ: 0.5000 mg | SUBCUTANEOUS | 0 refills | Status: AC

## 2024-11-04 NOTE — Progress Notes (Signed)
 "  Office: [phone]  /  Fax: [phone]  WEIGHT SUMMARY AND BIOMETRICS  Starting Date: 09/05/24  Starting Weight: 159lb   Weight Lost Since Last Visit: 3lb   Vitals BP: 119/82 Pulse Rate: 77 SpO2: 97 %   Body Composition  Body Fat %: 39.4 % Fat Mass (lbs): 63.2 lbs Muscle Mass (lbs): 92.2 lbs Total Body Water (lbs): 63.4 lbs Visceral Fat Rating : 9     HPI  Chief Complaint: OBESITY  Megan Roach is here to discuss her progress with her obesity treatment plan. She is on the the Category 2 Plan and states she is following her eating plan approximately 80 % of the time. She states she is exercising 60 minutes 4 times per week.   Interval History:  Since last office visit she is up 1 lb in past 2 mos She added yoga and weight training 1.5 weeks, occurring 4 x a week She is working on cat 2 meal plan She is getting in more lean protein Denies meal skipping or GI upset with new start Wegovy   She is noticing smaller portion sizes She is getting in lean protein and fiber with meals She is working on water intake Sugar cravings have reduced since starting Wegovy   Pharmacotherapy: Wegovy  0.25 mg q week  PHYSICAL EXAM:  Blood pressure 119/82, pulse 77, height 4' 11 (1.499 m), weight 160 lb (72.6 kg), SpO2 97%. Body mass index is 32.32 kg/m.  General: She is overweight, cooperative, alert, well developed, and in no acute distress. PSYCH: Has normal mood, affect and thought process.   Lungs: Normal breathing effort, no conversational dyspnea.   ASSESSMENT AND PLAN  TREATMENT PLAN FOR OBESITY:  Recommended Dietary Goals  Megan Roach is currently in the action stage of change. As such, her goal is to continue weight management plan. She has agreed to the Category 2 Plan.  Behavioral Intervention  We discussed the following Behavioral Modification Strategies today: increasing lean protein intake to established goals, increasing fiber rich foods, increasing water  intake , work on meal planning and preparation, keeping healthy foods at home, continue to practice mindfulness when eating, and planning for success. Smaller meals 4-5 x a day, focus on lean protein and fiber (fruits/ veggies) Increase water intake Add sugar free electrolyte daily  Additional resources provided today: NA  Recommended Physical Activity Goals  Megham has been advised to work up to 150 minutes of moderate intensity aerobic activity a week and strengthening exercises 2-3 times per week for cardiovascular health, weight loss maintenance and preservation of muscle mass.   She has agreed to [employer] strengthening exercises with a goal of 2-3 sessions a week  and Start aerobic activity with a goal of 150 minutes a week at moderate intensity.  Aim for increasing walking time over the next 1-2 mos  Pharmacotherapy changes for the treatment of obesity: increase Wegovy  to 0.5 mg weekly  ASSOCIATED CONDITIONS ADDRESSED TODAY  Fatty liver  Fibrosis 4 Score = .67 (Low risk)        Interpretation for patients with NAFLD          <1.30       -  F0-F1 (Low risk)          1.30-2.67 -  Indeterminate           >2.67      -  F3-F4 (High risk)     Validated for ages 21-65       Continue active plan for weight loss with  a low saturated fat/ low sugar diet + use of Wegovy .  Avoid ETOH intake. -     Wegovy ; Inject 0.5 mg into the skin once a week.  Dispense: 2 mL; Refill: 0  Obesity, Class I, BMI 30-34.9 -     Wegovy ; Inject 0.5 mg into the skin once a week.  Dispense: 2 mL; Refill: 0 Improving with minimal adverse SE.  OK to increase to dose 2.  Ramp up exercise time.  Patient was counseled on the importance of maintaining healthy lifestyle habits, including balanced nutrition, regular physical activity, and behavioral modifications, while taking antiobesity medication.  Patient verbalized understanding that medication is an adjunct to, not a replacement for, lifestyle changes and that the  long-term success and weight maintenance depend on continued adherence to these strategies.  Vitamin D  deficiency -     Vitamin D  (Ergocalciferol ); Take 1 capsule (50,000 Units total) by mouth every 7 (seven) days.  Dispense: 12 capsule; Refill: 0 Last vitamin D  Lab Results  Component Value Date   VD25OH 33.2 09/05/2024   Continue RX vitamin D  weekly Repeat lab next visit  BMI 32.0-32.9,adult      She was informed of the importance of frequent follow up visits to maximize her success with intensive lifestyle modifications for her multiple health conditions.   ATTESTASTION STATEMENTS:  Reviewed by clinician on day of visit: allergies, medications, problem list, medical history, surgical history, family history, social history, and previous encounter notes pertinent to obesity diagnosis.      Darice Haddock, D.O. DABFM, DABOM [facility] [address] [place], [place] [address] [phone] "

## 2024-11-19 ENCOUNTER — Ambulatory Visit (HOSPITAL_BASED_OUTPATIENT_CLINIC_OR_DEPARTMENT_OTHER): Admitting: Pulmonary Disease

## 2024-11-28 ENCOUNTER — Ambulatory Visit: Admitting: Family Medicine
